# Patient Record
Sex: Female | Born: 1935 | Race: White | Hispanic: No | Smoking: Never smoker
Health system: Southern US, Community
[De-identification: ages and names within clinical notes are randomized; demographics above are authoritative.]

## PROBLEM LIST (undated history)

## (undated) DIAGNOSIS — I639 Cerebral infarction, unspecified: Secondary | ICD-10-CM

## (undated) DIAGNOSIS — I2699 Other pulmonary embolism without acute cor pulmonale: Secondary | ICD-10-CM

## (undated) DIAGNOSIS — E079 Disorder of thyroid, unspecified: Secondary | ICD-10-CM

## (undated) DIAGNOSIS — H409 Unspecified glaucoma: Secondary | ICD-10-CM

## (undated) HISTORY — PX: BACK SURGERY: SHX140

---

## 2000-10-25 ENCOUNTER — Ambulatory Visit (HOSPITAL_COMMUNITY): Admission: RE | Admit: 2000-10-25 | Discharge: 2000-10-26 | Payer: Self-pay | Admitting: Ophthalmology

## 2000-10-25 ENCOUNTER — Encounter: Payer: Self-pay | Admitting: Ophthalmology

## 2004-12-23 ENCOUNTER — Ambulatory Visit (HOSPITAL_COMMUNITY): Admission: RE | Admit: 2004-12-23 | Discharge: 2004-12-23 | Payer: Self-pay | Admitting: Pulmonary Disease

## 2005-02-12 ENCOUNTER — Ambulatory Visit (HOSPITAL_COMMUNITY): Admission: RE | Admit: 2005-02-12 | Discharge: 2005-02-12 | Payer: Self-pay | Admitting: Pulmonary Disease

## 2005-03-01 DIAGNOSIS — K559 Vascular disorder of intestine, unspecified: Secondary | ICD-10-CM

## 2005-03-01 HISTORY — DX: Vascular disorder of intestine, unspecified: K55.9

## 2005-05-25 ENCOUNTER — Ambulatory Visit: Payer: Self-pay | Admitting: Internal Medicine

## 2005-05-25 ENCOUNTER — Observation Stay (HOSPITAL_COMMUNITY): Admission: EM | Admit: 2005-05-25 | Discharge: 2005-05-27 | Payer: Self-pay | Admitting: Emergency Medicine

## 2005-05-26 ENCOUNTER — Encounter (INDEPENDENT_AMBULATORY_CARE_PROVIDER_SITE_OTHER): Payer: Self-pay | Admitting: Specialist

## 2008-07-10 ENCOUNTER — Emergency Department (HOSPITAL_COMMUNITY): Admission: EM | Admit: 2008-07-10 | Discharge: 2008-07-10 | Payer: Self-pay | Admitting: Emergency Medicine

## 2010-07-17 NOTE — Discharge Summary (Signed)
Lori George, Lori George               ACCOUNT NO.:  [id number]   MEDICAL RECORD NO.:  [id number]          PATIENT TYPE:  OBV   LOCATION:  A226                          FACILITY:  [facility]   PHYSICIAN:  Edward L. Juanetta Gosling, M.D.DATE OF BIRTH:  1932-04-11   DATE OF ADMISSION:  05/25/2005  DATE OF DISCHARGE:  03/29/2007LH                                 DISCHARGE SUMMARY   FINAL DISCHARGE DIAGNOSES:  1.  Ischemic colitis.  2.  Gastrointestinal bleeding secondary to #1.  3.  Anemia secondary to #1 and #2.  4.  Hypothyroidism.  5.  History of pneumonia.   HISTORY:  Ms. Victor is a 77 year old who came to the emergency room because  of abdominal discomfort.  The night prior to admission she started having  abdominal pain, cramping in her abdomen and then she developed severe  cramping in her abdomen and bloody diarrhea.  She has come to the emergency  room with diarrhea which has blood in it and cramping.  She has not had any  previous GI problems as far as she knows.   PHYSICAL EXAMINATION:  Physical examination shows she appears to be in mild  distress.  She was complaining of abdominal cramping.  Her abdomen was very  tender diffusely somewhat more on the right than on the left.  She had a  mucoid stool that was Hemoccult positive.   HOSPITAL COURSE:  She had a CT abdomen which showed pericolic inflammatory  stranding at the splenic flexure and descending colon.  Her other  laboratories, initially her hemoglobin level was 13.9 and it dropped to 10.5  by the time of discharge.  She had the CT, she had a consultation with the  GI team and underwent a colonoscopy.  The colonoscopy did show what appeared  to be ischemic colitis.  Because of the extent of her colitis she underwent  a CT angiogram to make sure that she did not have narrowed areas in the  blood vessels, etc., and this was normal without evidence of blood vessel  narrowing and she was discharged home in improved condition, told to  take  over-the-counter iron and is going to follow up with GI and in my office  both in about 2 weeks.      Edward L. Juanetta Gosling, M.D.  Electronically Signed     ELH/MEDQ  D:  05/27/2005  T:  05/29/2005  Job:  [id number]

## 2010-07-17 NOTE — Consult Note (Signed)
Lori George, Lori George               ACCOUNT NO.:  [id number]   MEDICAL RECORD NO.:  [id number]           PATIENT TYPE:   LOCATION:                                 FACILITY:   PHYSICIAN:  Lionel December, M.D.    DATE OF BIRTH:  May 09, 1932   DATE OF CONSULTATION:  05/25/2005  DATE OF DISCHARGE:                                   CONSULTATION   REFERRING PHYSICIAN:  Oneal Deputy. Juanetta Gosling, M.D.   REASON FOR CONSULTATION:  Rectal bleeding.   HISTORY OF PRESENT ILLNESS:  The patient is a 77 year old Caucasian female  patient of Dr. Shaune Pollack who presents to the emergency department with than  24 hour history of abdominal pain and hematochezia.  For the past couple  weeks she has had difficulty with constipation and hard stools.  She has  this intermittently.  She often has to strain to have a bowel movement.  Around 9 a.m. yesterday she developed severe onset of lower abdominal pain.  She states the pain was so severe she almost passed out.  It was located in  the lower to mid abdomen.  She had nausea but no vomiting.  No fever or  chills.  Shortly afterwards she passed fresh blood per rectum.  She has done  this on several occasions and states today the bleeding was more heavy than  yesterday.  She does have intermittent heartburn for which she takes Tums.  This also helps her feeling of indigestion or chest pressure postprandially.  She denies any weight loss.  She has never had a colonoscopy.  She denies  any NSAIDS or aspirin.   MEDICATIONS AT HOME:  Synthroid and eye drops, multivitamin, calcium.   ALLERGIES:  No known drug allergies.   PAST MEDICAL HISTORY:  1.  Chronic constipation.  2.  Hypothyroidism.  3.  She was treated for pneumonia four months ago.  4.  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  5.  No prior colonoscopy.   FAMILY HISTORY:  Mother died at age 34 of old age.  Father died of cancer,  but she is not sure what time.  She does not believe it was colon  cancer.   SOCIAL HISTORY:  She is widowed.  She has three children.  She lives alone.  She has never been a smoker, denies any alcohol use.   REVIEW OF SYSTEMS:  See HPI for GI.  CONSTITUTIONAL:  No significant weight  loss recently.  CARDIOPULMONARY:  No chest pain, shortness of breath, or  palpitations.  GENITOURINARY:  No dysuria or hematuria.   PHYSICAL EXAMINATION:  VITAL SIGNS:  Temperature 97.5, pulse 72,  respirations 16, blood pressure 129/60.  GENERAL:  Pleasant, thin, elderly Caucasian female in no acute distress.  SKIN:  Warm and dry, no jaundice.  HEENT:  Conjunctivae are pink, sclerae nonicteric.  Oropharyngeal mucosa  moist and pink.  No lesions, erythema, or exudate.  NECK:  No lymphadenopathy or thyromegaly.  CHEST:  Lungs are clear to auscultation.  CARDIAC:  Reveals regular rate and rhythm.  Normal S1/S2.  No  murmurs, rubs,  or gallops.  ABDOMEN:  Positive bowel sounds, slightly distended, symmetrical.  Abdomen  is soft.  She has moderate tenderness in the mid low abdomen to deep  palpation.  No organomegaly or masses appreciated.  No rebound tenderness or  guarding.  EXTREMITIES:  No edema.   LABORATORY DATA:  White count 11,800, hemoglobin 13.9, hematocrit 41.5, MCV  92.1, platelets 250,000.  BUN 25, creatinine 0.7, glucose 116, sodium 138,  potassium 3.8, total bilirubin 0.9, alkaline phosphatase 79, AST 27, ALT 19,  albumin 3.7.  INR 1, PTT 30.   IMPRESSION:  The patient is a 77 year old lady with less than 24 hour  history of abdominal pain/hematochezia, differential includes ischemic  colitis, although she does not have any notable risk factors.  Infectious  colitis not excluded, nor is colorectal cancer.  She has chronic  constipation which is intermittent.  No significant change recently,  however.  Hemoglobin and hematocrit stable.  CT of the abdomen and pelvis is  pending.   PLAN:  1.  Follow up CT of the abdomen and pelvis as available.  2.  Agree  with clear liquid diet, IV Protonix.  Continue supportive      measures.  3.  Follow serial H&H's as you are.  4.  She will need to have a colonoscopy at some point.  We will await CT      findings, reevaluate her in the morning, and go from there.      Tana Coast, P.A.      Lionel December, M.D.  Electronically Signed    LL/MEDQ  D:  05/25/2005  T:  05/25/2005  Job:  [id number]   cc:   Ramon Dredge L. Juanetta Gosling, M.D.  Fax: [phone]

## 2010-07-17 NOTE — Group Therapy Note (Signed)
Lori George, TOCCO               ACCOUNT NO.:  [id number]   MEDICAL RECORD NO.:  [id number]          PATIENT TYPE:  OBV   LOCATION:  A226                          FACILITY:  [facility]   PHYSICIAN:  Edward L. Juanetta Gosling, M.D.DATE OF BIRTH:  June 21, 1932   DATE OF PROCEDURE:  05/26/2005  DATE OF DISCHARGE:  05/27/2005                                   PROGRESS NOTE   SUBJECTIVE:  Lori George was admitted yesterday with what appears to be a GI  bleed.  Based on CT scan evidence and historical evidence, it is probably  ischemic colitis.  She is set for colonoscopy today.  She has no other new  complaints. Says she is having some trouble with the GoLYTELY preparation  but otherwise feels well.   OBJECTIVE:  VITAL SIGNS:  Her exam shows a temperature is 97.8, pulse 73,  respirations 20, blood pressure 111/51.  Her hemoglobin level this morning  is 13.4, white count 10,700, platelets 207.   ASSESSMENT:  She is better.   PLAN:  Plan is continue with current medications and treatments. No changes.  She is to have the colonoscopy and follow.      Edward L. Juanetta Gosling, M.D.  Electronically Signed     ELH/MEDQ  D:  05/26/2005  T:  05/27/2005  Job:  [id number]

## 2010-07-17 NOTE — Op Note (Signed)
[place]  Patient:    Lori George, Lori George Visit Number: [id number] MRN: [id number]          Service Type: DSU Location: [id number] Attending Physician:  Bertrum Sol Dictated by:   Beulah Gandy. Ashley Royalty, M.D. Proc. Date: 10/25/00 Adm. Date:  10/25/2000                             Operative Report  DATE OF BIRTH:  March 28, 1932  ADMISSION DIAGNOSIS:  Macular hole - left eye.  PROCEDURES: 1. Pars plana vitrectomy - left eye. 2. Retinal photocoagulation - left eye. 3. Gas/fluid exchange - left eye. 4. Membrane peel - left eye. 5. Serum patch - left eye. 6. Perfluoropropane injection - left eye.  SURGEON:  Beulah Gandy. Ashley Royalty, M.D.  ASSISTANT:  Lu Duffel, COA, [place].  ANESTHESIA:  General.  DETAILS:  Usual prep and drape.  Peritomies at 10, 2, and 4.  The 4 mm angled infusion port anchored into place at 4 oclock.  The lighted pick and the cutter were placed at 10 and 2 oclock respectively.  The contact lens ring was anchored into place at 6 and 12 oclock.  The vitrectomy was begun just behind the crystalline lens.  A core vitrectomy was performed down to the macular surface.  Once all the central vitreous was removed, the silicone tip suction line was placed in the eye and the fish strike sign occurred.  The posterior hyaloid was stripped free from its attachments to the edge of the hole in the disc with the silicone tip suction line and the lighted pick. Once these membranes were stripped from their attachment to the macular hole, the cutter was repositioned in the eye and additional vitrectomy was carried out removing all of this vitreous out to the equator.  The vitreous base was trimmed for 360 degrees with a 30 degree prismatic contact lens in place. Attention was then carried again to the macular region, where the magnifying contact lens was used.  The diamond dusted membrane scraper was used to peel the internal limiting membrane  from around the edge of the hole.  Once the hole was freed, the instruments were removed from the eye and the indirect ophthalmoscope laser was moved into place.  Then, 501 burns were placed around the retinal periphery with a power of 400 mW, 1000 microns each and 0.1 seconds each.  A gas/fluid exchange was then carried out with the lighted pick and suction over the optic nerve head.  Sufficient time was allowed for additional fluid to track down the walls of the eye.  During this time, a serum patch was prepared and the perfluoropropane mixture was made up to be 17%.  The fluid was removed.  The serum patch was delivered to the hole and perfluoropropane was exchanged for room air in the vitreous cavity.  The instruments were removed from the eye and 9-0 nylon was used to close the sclerotomy sites.  The wounds were tested and found to be tight with Weck-cel sponge.  Additional perfluoropropane mixture of gas was injected at 12 oclock to reinflate the globe and obtain a closing tension of 10 with the Barraquer tonometer.  The conjunctiva was closed with wet-field cautery.  Polymyxin and gentamicin were irrigated into Tenons space.  Atropine solution was applied. Decadron 10 mg was injected into the lower subconjunctival space.  Marcaine was injected around the globe for  postoperative pain.  Polysporin, a patch and shield were placed.  The patient was awakened and taken to recovery in satisfactory condition. Dictated by:   Beulah Gandy. Ashley Royalty, M.D. Attending Physician:  Bertrum Sol DD:  10/25/00 TD:  10/25/00 Job: [id number]

## 2010-07-17 NOTE — Op Note (Signed)
[name], Lori George               ACCOUNT NO.:  [id number]   MEDICAL RECORD NO.:  [id number]          PATIENT TYPE:  OBV   LOCATION:  A226                          FACILITY:  [facility]   PHYSICIAN:  Lionel December, M.D.    DATE OF BIRTH:  1932-09-15   DATE OF PROCEDURE:  05/26/2005  DATE OF DISCHARGE:                                 OPERATIVE REPORT   PROCEDURE:  Colonoscopy.   INDICATIONS:  Lori George is a 77 year old Caucasian female who presents with  sudden onset of abdominal pain followed by rectal bleeding.  She had CT  suggesting thickening to descending colon.  Her presentation is felt to be  typical of ischemic colitis.  She is undergoing diagnostic colonoscopy.  Procedure and risks were reviewed with the patient and informed consent was  obtained.   MEDICINES FOR CONSCIOUS SEDATION:  Demerol 25 mg IV, Versed 5 mg IV in  divided dose.   FINDINGS:  Procedure performed in endoscopy suite.  The patient's vital  signs and O2 saturations were monitored during the procedure and remained  stable.  The patient was placed left lateral recumbent position and rectal  examination performed.  No abnormality noted on external or digital exam.  Olympus videoscope was placed in the rectum and advanced, under vision, into  sigmoid colon and beyond.  Preparation was excellent.  She had patchy  erythema at proximal sigmoid colon along with a few small ulcers.  However,  there are multiple superficial ulcers involving descending colon.  These  changes extended to the splenic flexure.  She had focal erythema at distal  transverse colon.  Scope was passed into the cecum which was identified by  appendiceal orifice and ileocecal valve.  As the scope was withdrawn, the  colonic mucosa was examined for the second time and was normal; except in  descending colon as well as proximal sigmoid colon, biopsy was taken from  descending colon for routine histology.  Mucosa of the distal sigmoid colon  was normal.   Rectal mucosa similarly was normal.  Scope was retroflexed to  examine anorectal junction which was unremarkable.  Endoscope was  straightened and withdrawn.  The patient tolerated the procedure well.   FINAL DIAGNOSIS:  1.  Endoscopic changes are typical of ischemic colitis with most extensive      involvement of descending colon with some involvement of proximal      sigmoid colon.  2.  A few tiny diverticula at sigmoid colon.   RECOMMENDATIONS:  1.  Full liquid diet.  2.  Levsin SL before each meal.  3.  CT angio abdomen in a.m. to make sure that the major visceral arteries      are free of disease.      Lionel December, M.D.  Electronically Signed     NR/MEDQ  D:  05/26/2005  T:  05/26/2005  Job:  [id number]

## 2010-07-17 NOTE — H&P (Signed)
NAMEFELICIA, BLOOMQUIST               ACCOUNT NO.:  [id number]   MEDICAL RECORD NO.:  [id number]          PATIENT TYPE:  OBV   LOCATION:  A226                          FACILITY:  [facility]   PHYSICIAN:  Edward L. Juanetta Gosling, M.D.DATE OF BIRTH:  [birth year]   DATE OF ADMISSION:  05/25/2005  DATE OF DISCHARGE:  LH                                HISTORY & PHYSICAL   HISTORY:  Ms. Gitto is a 77 year old who came to the emergency room because  of abdominal discomfort. She said she started having trouble the night prior  to admission with abdominal pain, cramping in her abdomen, and then she  developed severe cramping and some bloody diarrhea. She says that the  cramping is intermittent but fairly severe. She has had some episodes of  cramping in the past, but it has not been exactly like this. She has had no  other GI problems in the past as far as she knows. She had an episode of  pneumonia last year and has a history of hypothyroidism but otherwise has  been in pretty good health in general. Her past medical history other than  that is negative. She has had cataract surgery.   FAMILY HISTORY:  Her father died in her 51s of cancer, mother of  complications of blood pressure problems, apparently.   SOCIAL HISTORY:  She does not drink any alcohol. She does not smoke any  tobacco.   REVIEW OF SYSTEMS:  Except as mentioned is pretty much negative. She says  she has not had anything similar to this in the past.   PHYSICAL EXAMINATION:  GENERAL:  Shows a well-developed, well-nourished  female who is in no acute distress now. She is complaining of some abdominal  cramping.  HEENT:  Her pupils are equal, round, and reactive to light and  accommodation. Her nose and throat are clear.  VITAL SIGNS:  Her vital signs are as recorded.  NECK:  Supple without masses.  CHEST:  Clear without wheezes, rales or rhonchi.  HEART:  Her heart is regular without murmur, gallop or rub.  ABDOMEN:  Her abdomen is very  mildly tender diffusely.  RECTAL:  Shows that she is somewhat tender in the rectum and has a mucoid  stool that is heme positive.  CENTRAL NERVOUS SYSTEM:  Grossly intact.  EXTREMITIES:  Showed no edema.   LABORATORY DATA:  Hemoglobin level was greater than 12, prothrombin time is  normal.   ASSESSMENT:  She has a history very suggestive of ischemic colitis. I have  discussed her situation with Dr. Karilyn Cota. I have gone ahead and ordered a CT  of the abdomen and pelvis and will await the results of that. I am going to  give her Protonix, give her IV fluids, check her hemoglobin and hematocrit  and make sure it does not drop. She will have blood as needed.      Edward L. Juanetta Gosling, M.D.  Electronically Signed     ELH/MEDQ  D:  05/25/2005  T:  05/26/2005  Job:  [id number]

## 2010-07-17 NOTE — Group Therapy Note (Signed)
Lori George, Lori George               ACCOUNT NO.:  [id number]   MEDICAL RECORD NO.:  [id number]          PATIENT TYPE:  OBV   LOCATION:  A226                          FACILITY:  [facility]   PHYSICIAN:  Edward L. Juanetta Gosling, M.D.DATE OF BIRTH:  Feb 20, 1933   DATE OF PROCEDURE:  05/27/2005  DATE OF DISCHARGE:  05/27/2005                                   PROGRESS NOTE   SUBJECTIVE:  [name] says she is feeling well.  She has no new  complaints.  Her colonoscopy yesterday did show evidence of what appears to  be an ischemic colitis.   OBJECTIVE:  Her exam shows her is temperature 98.6, pulse 67, respirations  19, blood pressure 104/58.  Her chest is clear.  Her abdomen soft.  She  looks comfortable.  Her hemoglobin has dropped to 10.5 now so she has had  some significant bleeding.   ASSESSMENT:  She has got ischemic colitis.   PLAN:  She is to undergo a CT angiogram today and then further workup  depending on that.      Edward L. Juanetta Gosling, M.D.  Electronically Signed     ELH/MEDQ  D:  05/27/2005  T:  05/28/2005  Job:  [id number]

## 2014-04-04 ENCOUNTER — Other Ambulatory Visit (HOSPITAL_COMMUNITY): Payer: Self-pay | Admitting: Pulmonary Disease

## 2014-04-04 ENCOUNTER — Ambulatory Visit (HOSPITAL_COMMUNITY)
Admission: RE | Admit: 2014-04-04 | Discharge: 2014-04-04 | Disposition: A | Discharge status: Home / Self Care | Payer: Medicare Other | Source: Ambulatory Visit | Attending: Pulmonary Disease | Admitting: Pulmonary Disease

## 2014-04-04 DIAGNOSIS — M79604 Pain in right leg: Secondary | ICD-10-CM | POA: Insufficient documentation

## 2014-04-04 DIAGNOSIS — M79605 Pain in left leg: Principal | ICD-10-CM

## 2014-04-04 DIAGNOSIS — R609 Edema, unspecified: Secondary | ICD-10-CM

## 2014-07-17 ENCOUNTER — Emergency Department (HOSPITAL_COMMUNITY)
Admission: EM | Admit: 2014-07-17 | Discharge: 2014-07-17 | Disposition: A | Discharge status: Home / Self Care | Payer: Medicare Other | Attending: Emergency Medicine | Admitting: Emergency Medicine

## 2014-07-17 ENCOUNTER — Emergency Department (HOSPITAL_COMMUNITY): Payer: Medicare Other

## 2014-07-17 ENCOUNTER — Encounter (HOSPITAL_COMMUNITY): Payer: Self-pay | Admitting: *Deleted

## 2014-07-17 ENCOUNTER — Other Ambulatory Visit: Payer: Self-pay

## 2014-07-17 DIAGNOSIS — E079 Disorder of thyroid, unspecified: Secondary | ICD-10-CM | POA: Diagnosis not present

## 2014-07-17 DIAGNOSIS — R0602 Shortness of breath: Secondary | ICD-10-CM | POA: Diagnosis not present

## 2014-07-17 DIAGNOSIS — R079 Chest pain, unspecified: Secondary | ICD-10-CM

## 2014-07-17 DIAGNOSIS — Z79899 Other long term (current) drug therapy: Secondary | ICD-10-CM | POA: Insufficient documentation

## 2014-07-17 DIAGNOSIS — J449 Chronic obstructive pulmonary disease, unspecified: Secondary | ICD-10-CM | POA: Diagnosis not present

## 2014-07-17 DIAGNOSIS — R072 Precordial pain: Secondary | ICD-10-CM | POA: Diagnosis not present

## 2014-07-17 HISTORY — DX: Disorder of thyroid, unspecified: E07.9

## 2014-07-17 LAB — COMPREHENSIVE METABOLIC PANEL
ALT: 21 U/L (ref 14–54)
AST: 24 U/L (ref 15–41)
Albumin: 4.2 g/dL (ref 3.5–5.0)
Alkaline Phosphatase: 82 U/L (ref 38–126)
Anion gap: 8 (ref 5–15)
BILIRUBIN TOTAL: 0.6 mg/dL (ref 0.3–1.2)
BUN: 26 mg/dL — ABNORMAL HIGH (ref 6–20)
CHLORIDE: 103 mmol/L (ref 101–111)
CO2: 28 mmol/L (ref 22–32)
Calcium: 9.9 mg/dL (ref 8.9–10.3)
Creatinine, Ser: 0.74 mg/dL (ref 0.44–1.00)
GFR calc Af Amer: >60 mL/min (ref >60)
GFR calc non Af Amer: >60 mL/min (ref >60)
Glucose, Bld: 110 mg/dL — ABNORMAL HIGH (ref 65–99)
POTASSIUM: 4.4 mmol/L (ref 3.5–5.1)
Sodium: 139 mmol/L (ref 135–145)
TOTAL PROTEIN: 7 g/dL (ref 6.5–8.1)

## 2014-07-17 LAB — CBC WITH DIFFERENTIAL/PLATELET
BASOS PCT: 1 % (ref 0–1)
Basophils Absolute: 0 10*3/uL (ref 0.0–0.1)
EOS PCT: 1 % (ref 0–5)
Eosinophils Absolute: 0.1 10*3/uL (ref 0.0–0.7)
HEMATOCRIT: 42.7 % (ref 36.0–46.0)
HEMOGLOBIN: 14.3 g/dL (ref 12.0–15.0)
Lymphocytes Relative: 39 % (ref 12–46)
Lymphs Abs: 2.2 10*3/uL (ref 0.7–4.0)
MCH: 31 pg (ref 26.0–34.0)
MCHC: 33.5 g/dL (ref 30.0–36.0)
MCV: 92.4 fL (ref 78.0–100.0)
MONO ABS: 0.4 10*3/uL (ref 0.1–1.0)
MONOS PCT: 8 % (ref 3–12)
NEUTROS ABS: 2.9 10*3/uL (ref 1.7–7.7)
Neutrophils Relative %: 51 % (ref 43–77)
Platelets: 253 10*3/uL (ref 150–400)
RBC: 4.62 MIL/uL (ref 3.87–5.11)
RDW: 12.3 % (ref 11.5–15.5)
WBC: 5.6 10*3/uL (ref 4.0–10.5)

## 2014-07-17 LAB — TROPONIN I

## 2014-07-17 MED ORDER — PANTOPRAZOLE SODIUM 40 MG PO TBEC
40.0000 mg | DELAYED_RELEASE_TABLET | Freq: Once | ORAL | Status: AC
  Administered 2014-07-17: 40 mg via ORAL
  Filled 2014-07-17: qty 1

## 2014-07-17 MED ORDER — PANTOPRAZOLE SODIUM 20 MG PO TBEC: 20.0000 mg | DELAYED_RELEASE_TABLET | Freq: Every day | ORAL | Status: DC

## 2014-07-17 NOTE — ED Provider Notes (Signed)
CSN: [id number]     Arrival date & time 07/17/14  1731 History   First MD Initiated Contact with Patient 07/17/14 1757     Chief Complaint  Patient presents with  . Chest Pain     (Consider location/radiation/quality/duration/timing/severity/associated sxs/prior Treatment) Patient is a 81 y.o. female presenting with chest pain. The history is provided by the patient (the pt states she had some epigastric and chest pain today that was helped by taking antacids).  Chest Pain Pain location:  Substernal area Pain quality: aching   Pain radiates to:  Does not radiate Pain radiates to the back: no   Pain severity:  Mild Onset quality:  Sudden Timing:  Sporadic Chronicity:  New Context: not breathing   Associated symptoms: no abdominal pain, no back pain, no cough, no fatigue and no headache     Past Medical History  Diagnosis Date  . Thyroid disease    Past Surgical History  Procedure Laterality Date  . Back surgery     No family history on file. History  Substance Use Topics  . Smoking status: Never Smoker   . Smokeless tobacco: Not on file  . Alcohol Use: No   OB History    No data available     Review of Systems  Constitutional: Negative for appetite change and fatigue.  HENT: Negative for congestion, ear discharge and sinus pressure.   Eyes: Negative for discharge.  Respiratory: Negative for cough.   Cardiovascular: Positive for chest pain.  Gastrointestinal: Negative for abdominal pain and diarrhea.  Genitourinary: Negative for frequency and hematuria.  Musculoskeletal: Negative for back pain.  Skin: Negative for rash.  Neurological: Negative for seizures and headaches.  Psychiatric/Behavioral: Negative for hallucinations.      Allergies  Review of patient's allergies indicates no known allergies.  Home Medications   Prior to Admission medications   Medication Sig Start Date End Date Taking? Authorizing Provider  brimonidine (ALPHAGAN) 0.2 % ophthalmic  solution Place 1 drop into both eyes daily. 05/30/14  Yes Historical Provider, MD  levothyroxine (SYNTHROID, LEVOTHROID) 88 MCG tablet Take 88 mcg by mouth daily before breakfast.   Yes Historical Provider, MD  timolol (TIMOPTIC) 0.5 % ophthalmic solution Place 1 drop into both eyes daily. 04/18/14  Yes Historical Provider, MD  pantoprazole (PROTONIX) 20 MG tablet Take 1 tablet (20 mg total) by mouth daily. 07/17/14   Bethann BerkshireJoseph Abdinasir Spadafore, MD   BP 133/68 mmHg  Pulse 73  Temp(Src) 98.1 F (36.7 C) (Oral)  Resp 26  Ht 5\' 4"  (1.626 m)  Wt 126 lb (57.153 kg)  BMI 21.62 kg/m2  SpO2 98% Physical Exam  Constitutional: She is oriented to person, place, and time. She appears well-developed.  HENT:  Head: Normocephalic.  Eyes: Conjunctivae and EOM are normal. No scleral icterus.  Neck: Neck supple. No thyromegaly present.  Cardiovascular: Normal rate and regular rhythm.  Exam reveals no gallop and no friction rub.   No murmur heard. Pulmonary/Chest: No stridor. She has no wheezes. She has no rales. She exhibits no tenderness.  Abdominal: She exhibits no distension. There is no tenderness. There is no rebound.  Musculoskeletal: Normal range of motion. She exhibits no edema.  Lymphadenopathy:    She has no cervical adenopathy.  Neurological: She is oriented to person, place, and time. She exhibits normal muscle tone. Coordination normal.  Skin: No rash noted. No erythema.  Psychiatric: She has a normal mood and affect. Her behavior is normal.    ED Course  Procedures (  including critical care time) Labs Review Labs Reviewed  COMPREHENSIVE METABOLIC PANEL - Abnormal; Notable for the following:    Glucose, Bld 110 (*)    BUN 26 (*)    All other components within normal limits  TROPONIN I  CBC WITH DIFFERENTIAL/PLATELET  TROPONIN I    Imaging Review Dg Chest 2 View  07/17/2014   CLINICAL DATA:  Mid chest pain and shortness of breath for several hours, vomiting  EXAM: CHEST  2 VIEW  COMPARISON:   02/12/2005  FINDINGS: Enlargement of cardiac silhouette.  Mediastinal contours and pulmonary vascularity normal.  Emphysematous and bronchitic changes consistent with COPD.  No acute infiltrate, pleural effusion or pneumothorax.  Bones demineralized with prior spinal augmentation procedure at upper lumbar spine.  IMPRESSION: COPD changes.  Enlargement of cardiac silhouette.  No acute abnormalities.   Electronically Signed   By: Ulyses SouthwardMark  Boles M.D.   On: 07/17/2014 18:52     EKG Interpretation   Date/Time:  Wednesday Jul 17 2014 17:35:11 EDT Ventricular Rate:  81 PR Interval:  136 QRS Duration: 80 QT Interval:  382 QTC Calculation: 443 R Axis:   57 Text Interpretation:  Normal sinus rhythm Possible Left atrial enlargement  Borderline ECG Confirmed by Kaeleen Odom  MD, Jomarie LongsJOSEPH [phone]([id number]) on 07/17/2014  7:08:41 PM Also confirmed by Hatcher Froning  MD, Jomarie LongsJOSEPH [phone]([id number])  on 07/17/2014  9:22:15 PM      MDM   Final diagnoses:  Chest pain at rest    Pt without pain,  Nl troponin x2 will rx protonix and have pt follow up with pcp in 1-2 days    Bethann BerkshireJoseph Zaynab Chipman, MD 07/17/14 2130

## 2014-07-17 NOTE — ED Notes (Signed)
MD at bedside. 

## 2014-07-17 NOTE — ED Notes (Addendum)
Pt c/o mid center chest pain that started a few hours with sob, "vomiting of sour contents X1",radiates to back area,   family reports that they gave pt one Prilosec with some improvement in pain, ekg performed in triage,

## 2014-07-17 NOTE — Discharge Instructions (Signed)
Take one aspirin a day and follow up with dr. Juanetta GoslingHawkins in 1-2 days.  Return if any problems

## 2014-07-26 DIAGNOSIS — E039 Hypothyroidism, unspecified: Secondary | ICD-10-CM | POA: Diagnosis not present

## 2014-07-26 DIAGNOSIS — I10 Essential (primary) hypertension: Secondary | ICD-10-CM | POA: Diagnosis not present

## 2014-07-26 DIAGNOSIS — K219 Gastro-esophageal reflux disease without esophagitis: Secondary | ICD-10-CM | POA: Diagnosis not present

## 2014-10-31 DIAGNOSIS — Z0000 Encounter for general adult medical examination without abnormal findings: Secondary | ICD-10-CM | POA: Diagnosis not present

## 2014-10-31 DIAGNOSIS — N390 Urinary tract infection, site not specified: Secondary | ICD-10-CM | POA: Diagnosis not present

## 2014-11-06 DIAGNOSIS — E039 Hypothyroidism, unspecified: Secondary | ICD-10-CM | POA: Diagnosis not present

## 2014-11-06 DIAGNOSIS — Z0000 Encounter for general adult medical examination without abnormal findings: Secondary | ICD-10-CM | POA: Diagnosis not present

## 2014-11-06 DIAGNOSIS — D649 Anemia, unspecified: Secondary | ICD-10-CM | POA: Diagnosis not present

## 2014-12-20 ENCOUNTER — Emergency Department (HOSPITAL_COMMUNITY)
Admission: EM | Admit: 2014-12-20 | Discharge: 2014-12-20 | Disposition: A | Discharge status: Home / Self Care | Payer: Medicare Other | Attending: Emergency Medicine | Admitting: Emergency Medicine

## 2014-12-20 ENCOUNTER — Emergency Department (HOSPITAL_COMMUNITY): Payer: Medicare Other

## 2014-12-20 ENCOUNTER — Encounter (HOSPITAL_COMMUNITY): Payer: Self-pay | Admitting: *Deleted

## 2014-12-20 DIAGNOSIS — E079 Disorder of thyroid, unspecified: Secondary | ICD-10-CM | POA: Insufficient documentation

## 2014-12-20 DIAGNOSIS — Y93H1 Activity, digging, shoveling and raking: Secondary | ICD-10-CM | POA: Diagnosis not present

## 2014-12-20 DIAGNOSIS — M79641 Pain in right hand: Secondary | ICD-10-CM | POA: Diagnosis not present

## 2014-12-20 DIAGNOSIS — M1990 Unspecified osteoarthritis, unspecified site: Secondary | ICD-10-CM | POA: Insufficient documentation

## 2014-12-20 DIAGNOSIS — Y998 Other external cause status: Secondary | ICD-10-CM | POA: Diagnosis not present

## 2014-12-20 DIAGNOSIS — S5011XA Contusion of right forearm, initial encounter: Secondary | ICD-10-CM | POA: Insufficient documentation

## 2014-12-20 DIAGNOSIS — Y92007 Garden or yard of unspecified non-institutional (private) residence as the place of occurrence of the external cause: Secondary | ICD-10-CM | POA: Diagnosis not present

## 2014-12-20 DIAGNOSIS — S60221A Contusion of right hand, initial encounter: Secondary | ICD-10-CM | POA: Diagnosis not present

## 2014-12-20 DIAGNOSIS — W010XXA Fall on same level from slipping, tripping and stumbling without subsequent striking against object, initial encounter: Secondary | ICD-10-CM | POA: Diagnosis not present

## 2014-12-20 DIAGNOSIS — Z79899 Other long term (current) drug therapy: Secondary | ICD-10-CM | POA: Diagnosis not present

## 2014-12-20 DIAGNOSIS — S6991XA Unspecified injury of right wrist, hand and finger(s), initial encounter: Secondary | ICD-10-CM | POA: Diagnosis present

## 2014-12-20 NOTE — Discharge Instructions (Signed)
Your x-ray tonight does not show broken bones or dislocation. The ray will not show ligament injury. Apply ice, elevate, wear the splint for comfort, take Aleve for pain, follow up with Dr. Izora Ribasoley for further evaluation.

## 2014-12-20 NOTE — ED Notes (Signed)
Pt fell while raking her yard ~1430 and later began to have swelling and pain to right hand.

## 2014-12-20 NOTE — ED Provider Notes (Signed)
CSN: [id number]     Arrival date & time 12/20/14  1828 History   First MD Initiated Contact with Patient 12/20/14 2217     Chief Complaint  Patient presents with  . Hand Injury     (Consider location/radiation/quality/duration/timing/severity/associated sxs/prior Treatment) Patient is a 81 y.o. female presenting with hand injury. The history is provided by the patient.  Hand Injury Location:  Hand Hand location:  R hand Pain details:    Quality:  Aching   Radiates to:  Does not radiate   Timing:  Constant  Blaklee P Taitano is a 81 y.o. female who presents to the ED with swelling to the right hand that started today after she was raking her yard. She states that she started across the yard to pick up a can and tripped on a root and fell. She put her right hand out to keep from hitting her face and her fingers bent under and her arm slid on the ground. She complains of swelling and pain to the hand and bruising and pain to the forearm. 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 She takes Aleve for her pain. She states that the pain is not as bad now as it was earlier today.   Past Medical History  Diagnosis Date  . Thyroid disease    Past Surgical History  Procedure Laterality Date  . Back surgery     No family history on file. Social History  Substance Use Topics  . Smoking status: Never Smoker   . Smokeless tobacco: None  . Alcohol Use: No   OB History    No data available     Review of Systems Negative except as stated in HPI   Allergies  Review of patient's allergies indicates no known allergies.  Home Medications   Prior to Admission medications   Medication Sig Start Date End Date Taking? Authorizing Provider  brimonidine (ALPHAGAN) 0.2 % ophthalmic solution Place 1 drop into both eyes daily. 05/30/14   Historical Provider, MD  levothyroxine (SYNTHROID, LEVOTHROID) 88 MCG tablet Take 88 mcg by mouth daily before  breakfast.    Historical Provider, MD  pantoprazole (PROTONIX) 20 MG tablet Take 1 tablet (20 mg total) by mouth daily. 07/17/14   Bethann Berkshire, MD  timolol (TIMOPTIC) 0.5 % ophthalmic solution Place 1 drop into both eyes daily. 04/18/14   Historical Provider, MD   BP 124/79 mmHg  Pulse 78  Temp(Src) 97.8 F (36.6 C) (Oral)  Resp 18  Ht  (1.626 m)  Wt 117 lb (53.071 kg)  BMI 20.07 kg/m2  SpO2 100% Physical Exam  Constitutional: She is oriented to person, place, and time. She appears well-developed and well-nourished.  HENT:  Head: Normocephalic and atraumatic.  Eyes: Conjunctivae and EOM are normal.  Neck: Normal range of motion. Neck supple.  Cardiovascular: Normal rate.   Pulmonary/Chest: Effort normal.  Musculoskeletal:       Right forearm: She exhibits tenderness. She exhibits no laceration.       Right hand: She exhibits tenderness, bony tenderness and swelling. She exhibits normal capillary refill and no laceration. Decreased range of motion: due to pain. Normal sensation noted. Normal strength noted.       Hands: Pain and swelling to the dorsum of the right hand with mild erythema. Ecchymosis and tenderness to the palmar aspect of the right wrist. Radial pulse 2+, adequate circulation, good touch sensation. Full range of motion of the elbow and  shoulder.   Neurological: She is alert and oriented to person, place, and time. No cranial nerve deficit.  Skin: Skin is warm and dry.  Psychiatric: She has a normal mood and affect. Her behavior is normal.  Nursing note and vitals reviewed.   ED Course  Procedures (including critical care time) Labs Review Labs Reviewed - No data to display  Imaging Review Dg Hand Complete Right  12/20/2014  CLINICAL DATA:  Fall, right hand swelling/pain EXAM: RIGHT HAND - COMPLETE 3+ VIEW COMPARISON:  None. FINDINGS: No fracture or dislocation is seen. Mild degenerative changes at the 1st MCP joint and 2nd-4th DIP joints. Mild soft tissue  swelling along the 2nd digit. Mild dorsal soft tissue swelling overlying the MCP joints. IMPRESSION: No fracture or dislocation is seen. Electronically Signed   By: Charline BillsSriyesh  Krishnan M.D.   On: 12/20/2014 18:57    MDM  81 y.o. female with pain and swelling of the right hand and bruising to the right forearm s/p fall earlier today. Wrist splint, ice, elevation and follow up with the hand surgeon. Discussed with the patient and her family signs and symptoms to watch for with compartment syndrome. Discussed x-ray results and plan of care. Stable for d/c without focal neuro deficits.  Final diagnoses:  Contusion, hand, right, initial encounter  Forearm contusion, right, initial encounter       Janne NapoleonHope M Neese, NP 12/20/14 2316  Samuel JesterKathleen McManus, DO 12/24/14 1919

## 2015-03-27 DIAGNOSIS — E039 Hypothyroidism, unspecified: Secondary | ICD-10-CM | POA: Diagnosis not present

## 2015-03-27 DIAGNOSIS — D649 Anemia, unspecified: Secondary | ICD-10-CM | POA: Diagnosis not present

## 2015-04-04 DIAGNOSIS — H52221 Regular astigmatism, right eye: Secondary | ICD-10-CM | POA: Diagnosis not present

## 2015-04-04 DIAGNOSIS — H401112 Primary open-angle glaucoma, right eye, moderate stage: Secondary | ICD-10-CM | POA: Diagnosis not present

## 2015-04-04 DIAGNOSIS — H5212 Myopia, left eye: Secondary | ICD-10-CM | POA: Diagnosis not present

## 2015-04-04 DIAGNOSIS — H524 Presbyopia: Secondary | ICD-10-CM | POA: Diagnosis not present

## 2015-04-17 DIAGNOSIS — H401112 Primary open-angle glaucoma, right eye, moderate stage: Secondary | ICD-10-CM | POA: Diagnosis not present

## 2015-04-17 DIAGNOSIS — H401121 Primary open-angle glaucoma, left eye, mild stage: Secondary | ICD-10-CM | POA: Diagnosis not present

## 2015-08-14 DIAGNOSIS — H6123 Impacted cerumen, bilateral: Secondary | ICD-10-CM | POA: Diagnosis not present

## 2015-08-14 DIAGNOSIS — H903 Sensorineural hearing loss, bilateral: Secondary | ICD-10-CM | POA: Diagnosis not present

## 2015-08-14 DIAGNOSIS — B369 Superficial mycosis, unspecified: Secondary | ICD-10-CM | POA: Diagnosis not present

## 2015-08-14 DIAGNOSIS — H6240 Otitis externa in other diseases classified elsewhere, unspecified ear: Secondary | ICD-10-CM | POA: Diagnosis not present

## 2015-09-30 ENCOUNTER — Emergency Department (HOSPITAL_COMMUNITY)
Admission: EM | Admit: 2015-09-30 | Discharge: 2015-09-30 | Disposition: A | Discharge status: Home / Self Care | Payer: Medicare Other | Attending: Emergency Medicine | Admitting: Emergency Medicine

## 2015-09-30 ENCOUNTER — Encounter (HOSPITAL_COMMUNITY): Payer: Self-pay | Admitting: Emergency Medicine

## 2015-09-30 ENCOUNTER — Emergency Department (HOSPITAL_COMMUNITY): Payer: Medicare Other

## 2015-09-30 DIAGNOSIS — Z79899 Other long term (current) drug therapy: Secondary | ICD-10-CM | POA: Insufficient documentation

## 2015-09-30 DIAGNOSIS — K5900 Constipation, unspecified: Secondary | ICD-10-CM | POA: Diagnosis not present

## 2015-09-30 DIAGNOSIS — R1084 Generalized abdominal pain: Secondary | ICD-10-CM | POA: Diagnosis not present

## 2015-09-30 LAB — COMPREHENSIVE METABOLIC PANEL
ALK PHOS: 64 U/L (ref 38–126)
ALT: 23 U/L (ref 14–54)
AST: 22 U/L (ref 15–41)
Albumin: 4.6 g/dL (ref 3.5–5.0)
Anion gap: 6 (ref 5–15)
BILIRUBIN TOTAL: 0.5 mg/dL (ref 0.3–1.2)
BUN: 22 mg/dL — AB (ref 6–20)
CALCIUM: 10.1 mg/dL (ref 8.9–10.3)
CO2: 26 mmol/L (ref 22–32)
Chloride: 104 mmol/L (ref 101–111)
Creatinine, Ser: 0.75 mg/dL (ref 0.44–1.00)
GFR calc Af Amer: >60 mL/min (ref >60)
Glucose, Bld: 141 mg/dL — ABNORMAL HIGH (ref 65–99)
Potassium: 3.5 mmol/L (ref 3.5–5.1)
Sodium: 136 mmol/L (ref 135–145)
TOTAL PROTEIN: 7.5 g/dL (ref 6.5–8.1)

## 2015-09-30 LAB — SAMPLE TO BLOOD BANK

## 2015-09-30 LAB — CBC WITH DIFFERENTIAL/PLATELET
BASOS ABS: 0 10*3/uL (ref 0.0–0.1)
BASOS PCT: 1 %
EOS ABS: 0 10*3/uL (ref 0.0–0.7)
EOS PCT: 0 %
HCT: 39.2 % (ref 36.0–46.0)
Hemoglobin: 13.2 g/dL (ref 12.0–15.0)
Lymphocytes Relative: 19 %
Lymphs Abs: 1.3 10*3/uL (ref 0.7–4.0)
MCH: 30.8 pg (ref 26.0–34.0)
MCHC: 33.7 g/dL (ref 30.0–36.0)
MCV: 91.6 fL (ref 78.0–100.0)
Monocytes Absolute: 0.5 10*3/uL (ref 0.1–1.0)
Monocytes Relative: 8 %
Neutro Abs: 5.1 10*3/uL (ref 1.7–7.7)
Neutrophils Relative %: 72 %
PLATELETS: 236 10*3/uL (ref 150–400)
RBC: 4.28 MIL/uL (ref 3.87–5.11)
RDW: 12.7 % (ref 11.5–15.5)
WBC: 7 10*3/uL (ref 4.0–10.5)

## 2015-09-30 LAB — PROTIME-INR
INR: 0.96
PROTHROMBIN TIME: 12.8 s (ref 11.4–15.2)

## 2015-09-30 LAB — TROPONIN I

## 2015-09-30 LAB — APTT: aPTT: 27 seconds (ref 24–36)

## 2015-09-30 MED ORDER — ONDANSETRON HCL 4 MG/2ML IJ SOLN
4.0000 mg | Freq: Once | INTRAMUSCULAR | Status: AC
  Administered 2015-09-30: 4 mg via INTRAVENOUS

## 2015-09-30 MED ORDER — FLEET ENEMA 7-19 GM/118ML RE ENEM
1.0000 | ENEMA | Freq: Once | RECTAL | Status: AC
  Administered 2015-09-30: 1 via RECTAL

## 2015-09-30 MED ORDER — PEG 3350-KCL-NA BICARB-NACL 420 G PO SOLR
4000.0000 mL | Freq: Once | ORAL | Status: AC
  Administered 2015-09-30: 4000 mL via ORAL
  Filled 2015-09-30: qty 4000

## 2015-09-30 MED ORDER — FENTANYL CITRATE (PF) 100 MCG/2ML IJ SOLN
50.0000 ug | Freq: Once | INTRAMUSCULAR | Status: AC
  Administered 2015-09-30: 50 ug via INTRAVENOUS
  Filled 2015-09-30: qty 2

## 2015-09-30 MED ORDER — ONDANSETRON HCL 4 MG/2ML IJ SOLN
INTRAMUSCULAR | Status: AC
  Filled 2015-09-30: qty 2

## 2015-09-30 MED ORDER — ONDANSETRON HCL 4 MG/2ML IJ SOLN
4.0000 mg | Freq: Once | INTRAMUSCULAR | Status: AC
  Administered 2015-09-30: 4 mg via INTRAVENOUS
  Filled 2015-09-30: qty 2

## 2015-09-30 MED ORDER — SODIUM CHLORIDE 0.9 % IV BOLUS (SEPSIS)
1000.0000 mL | Freq: Once | INTRAVENOUS | Status: AC
  Administered 2015-09-30: 1000 mL via INTRAVENOUS

## 2015-09-30 NOTE — Discharge Instructions (Signed)
Take the nulytely as directed. You should start having good bowel movements afterwards. You can then take MiraLAX over-the-counter once or twice daily as needed to keep your bowel movements regular. Return to the emergency department if you get fever, vomiting, or worsening abdominal pain again.

## 2015-09-30 NOTE — ED Provider Notes (Signed)
[facility] DEPT Provider Note   CSN: [id number] Arrival date & time: 09/30/15  0153  First Provider Contact:  02:05 AM   Level V caveat for urgent need for intervention   History   Chief Complaint Chief Complaint  Patient presents with  . Abdominal Pain    HPI Lori George is a 82 y.o. female.  HPI patient reports she woke up from sleep about 3 hours ago with diffuse abdominal pain. She states the pain is getting worse. She feels bloated and has had nausea without vomiting. She's also been belching. She states she's had a normal appetite. She states she has been constipated for the past 2 weeks. She has been taking fiber pills without relief. She states she has been passing gas rectally without having bowel movements. She states she's never had this pain before.   PCP Dr Juanetta Gosling  Past Medical History:  Diagnosis Date  . Thyroid disease     There are no active problems to display for this patient.   Past Surgical History:  Procedure Laterality Date  . BACK SURGERY      OB History    No data available       Home Medications    Prior to Admission medications   Medication Sig Start Date End Date Taking? Authorizing Provider  brimonidine (ALPHAGAN) 0.2 % ophthalmic solution Place 1 drop into both eyes daily. 05/30/14  Yes Historical Provider, MD  levothyroxine (SYNTHROID, LEVOTHROID) 88 MCG tablet Take 88 mcg by mouth daily before breakfast.   Yes Historical Provider, MD  timolol (TIMOPTIC) 0.5 % ophthalmic solution Place 1 drop into both eyes daily. 04/18/14  Yes Historical Provider, MD  pantoprazole (PROTONIX) 20 MG tablet Take 1 tablet (20 mg total) by mouth daily. 07/17/14   Bethann Berkshire, MD    Family History History reviewed. No pertinent family history.  Social History Social History  Substance Use Topics  . Smoking status: Never Smoker  . Smokeless tobacco: Never Used  . Alcohol use No  Lives at home Lives alone   Allergies   Review of  patient's allergies indicates no known allergies.   Review of Systems Review of Systems  All other systems reviewed and are negative.    Physical Exam Updated Vital Signs BP 153/79   Pulse 76   Temp 97.5 F (36.4 C)   Resp (!) 32   Ht 5\' 4"  (1.626 m)   Wt 126 lb (57.2 kg)   SpO2 100%   BMI 21.63 kg/m   Vital signs normal except for hypertension and tachypnea   Physical Exam  Constitutional: She is oriented to person, place, and time.  Non-toxic appearance. She does not appear ill. She appears distressed.  Thin elderly female in distress.  HENT:  Head: Normocephalic and atraumatic.  Right Ear: External ear normal.  Left Ear: External ear normal.  Nose: Nose normal. No mucosal edema or rhinorrhea.  Mouth/Throat: Oropharynx is clear and moist and mucous membranes are normal. No dental abscesses or uvula swelling.  Eyes: Conjunctivae and EOM are normal. Pupils are equal, round, and reactive to light.  Neck: Normal range of motion and full passive range of motion without pain. Neck supple.  Cardiovascular: Normal rate, regular rhythm and normal heart sounds.  Exam reveals no gallop and no friction rub.   No murmur heard. Pulmonary/Chest: Effort normal and breath sounds normal. Tachypnea noted. No respiratory distress. She has no wheezes. She has no rhonchi. She has no rales. She exhibits no tenderness  and no crepitus.  Abdominal: Soft. Normal appearance. She exhibits distension. Bowel sounds are decreased. There is generalized tenderness. There is guarding. There is no rebound.  Musculoskeletal: Normal range of motion. She exhibits no edema or tenderness.  Moves all extremities well.   Neurological: She is alert and oriented to person, place, and time. She has normal strength. No cranial nerve deficit.  Skin: Skin is warm, dry and intact. No rash noted. No erythema. No pallor.  Psychiatric: She has a normal mood and affect. Her speech is normal and behavior is normal. Her mood  appears not anxious.  Nursing note and vitals reviewed.    ED Treatments / Results  Labs (all labs ordered are listed, but only abnormal results are displayed) Results for orders placed or performed during the hospital encounter of 09/30/15  Comprehensive metabolic panel  Result Value Ref Range   Sodium 136 135 - 145 mmol/L   Potassium 3.5 3.5 - 5.1 mmol/L   Chloride 104 101 - 111 mmol/L   CO2 26 22 - 32 mmol/L   Glucose, Bld 141 (H) 65 - 99 mg/dL   BUN 22 (H) 6 - 20 mg/dL   Creatinine, Ser 4.09 0.44 - 1.00 mg/dL   Calcium 81.1 8.9 - 91.4 mg/dL   Total Protein 7.5 6.5 - 8.1 g/dL   Albumin 4.6 3.5 - 5.0 g/dL   AST 22 15 - 41 U/L   ALT 23 14 - 54 U/L   Alkaline Phosphatase 64 38 - 126 U/L   Total Bilirubin 0.5 0.3 - 1.2 mg/dL   GFR calc non Af Amer >60 >60 mL/min   GFR calc Af Amer >60 >60 mL/min   Anion gap 6 5 - 15  CBC with Differential  Result Value Ref Range   WBC 7.0 4.0 - 10.5 K/uL   RBC 4.28 3.87 - 5.11 MIL/uL   Hemoglobin 13.2 12.0 - 15.0 g/dL   HCT 78.2 95.6 - 21.3 %   MCV 91.6 78.0 - 100.0 fL   MCH 30.8 26.0 - 34.0 pg   MCHC 33.7 30.0 - 36.0 g/dL   RDW 08.6 57.8 - 46.9 %   Platelets 236 150 - 400 K/uL   Neutrophils Relative % 72 %   Neutro Abs 5.1 1.7 - 7.7 K/uL   Lymphocytes Relative 19 %   Lymphs Abs 1.3 0.7 - 4.0 K/uL   Monocytes Relative 8 %   Monocytes Absolute 0.5 0.1 - 1.0 K/uL   Eosinophils Relative 0 %   Eosinophils Absolute 0.0 0.0 - 0.7 K/uL   Basophils Relative 1 %   Basophils Absolute 0.0 0.0 - 0.1 K/uL  Troponin I  Result Value Ref Range   Troponin I <0.03 <0.03 ng/mL  Protime-INR  Result Value Ref Range   Prothrombin Time 12.8 11.4 - 15.2 seconds   INR 0.96   APTT  Result Value Ref Range   aPTT 27 24 - 36 seconds   Laboratory interpretation all normal except hyperglycemia (nonfasting)    EKG  EKG Interpretation  Date/Time:  Tuesday September 30 2015 02:10:24 EDT Ventricular Rate:  78 PR Interval:    QRS Duration: 92 QT  Interval:  394 QTC Calculation: 449 R Axis:   87 Text Interpretation:  Sinus rhythm Borderline right axis deviation Nonspecific T abnrm, anterolateral leads Baseline wander No significant change since last tracing 17 Jul 2014 Confirmed by Emilianna Barlowe  MD-I, Tamakia Porto ([id number]) on 09/30/2015 2:18:32 AM       Radiology Ct Abdomen Pelvis Wo  Contrast  Result Date: 09/30/2015 CLINICAL DATA:  82 year old female with a few diffuse abdominal pain. EXAM: CT ABDOMEN AND PELVIS WITHOUT CONTRAST TECHNIQUE: Multidetector CT imaging of the abdomen and pelvis was performed following the standard protocol without IV contrast. COMPARISON:  CT dated 05/27/2005 FINDINGS: The visualized lung bases are clear.  Mild cardiomegaly. No intra-abdominal free air or free fluid. The the liver, gallbladder, pancreas, spleen, and the adrenal glands appear unremarkable. There are nonobstructing bilateral renal calculi. A cluster of small stones noted in the upper pole of the left kidney with a combined diameter of approximately 5 mm. There is no hydronephrosis on either side. The visualized ureters and urinary bladder appear unremarkable. The uterus and ovaries are grossly unremarkable. There is moderate stool throughout the colon. The cecum is mobile and located in the left upper abdomen. No evidence of twisting or mechanical obstruction. The terminal ileum is collapsed and likely extends superiorly through a mesenteric defect. Normal caliber fluid containing loops of bowel noted in the pelvis. There is no evidence of small-bowel obstruction at this time. Correlation with clinical exam and follow-up recommended. Normal appendix. The abdominal aorta and IVC appear unremarkable. No portal venous gas identified. There is no adenopathy. The abdominal wall soft tissues appear unremarkable. There is osteopenia with degenerative changes of the spine. L1 compression fracture with vertebroplasty changes. No acute fracture. IMPRESSION: Mobile cecum located in  the left upper abdomen. There is no evidence of twisting or mechanical obstruction of the colon. Collapse appearance of the terminal ileum with normal caliber loops of distal small bowel. No definite evidence of bowel obstruction at this time. Normal appendix. Small Nonobstructing bilateral renal calculi.  No hydronephrosis. Electronically Signed   By: Elgie Collard M.D.   On: 09/30/2015 03:24    Procedures Procedures (including critical care time)  Medications Ordered in ED Medications  polyethylene glycol-electrolytes (NuLYTELY/GoLYTELY) solution 4,000 mL (not administered)  sodium chloride 0.9 % bolus 1,000 mL (0 mLs Intravenous Stopped 09/30/15 0353)  ondansetron (ZOFRAN) injection 4 mg (4 mg Intravenous Given 09/30/15 0227)  fentaNYL (SUBLIMAZE) injection 50 mcg (50 mcg Intravenous Given 09/30/15 0227)  ondansetron (ZOFRAN) injection 4 mg (4 mg Intravenous Given 09/30/15 0305)  sodium phosphate (FLEET) 7-19 GM/118ML enema 1 enema (1 enema Rectal Given 09/30/15 0347)     Initial Impression / Assessment and Plan / ED Course  I have reviewed the triage vital signs and the nursing notes.  Pertinent labs & imaging results that were available during my care of the patient were reviewed by me and considered in my medical decision making (see chart for details).  Clinical Course   Patient appears to be in moderate distress from pain. Her abdomen is very distended. CT of the abdomen/pelvis was done without contrast to look for possible AAA, bowel perforation, or possibly just constipation. Patient was given IV fluids and IV pain and nausea medication. Laboratory tests were ordered.  After reviewing and discussing patient's CT scan with patient she was given an enema for constipation.  Patient had minimal results with the enema. However her pain is much improved now, her abdomen is soft and I compel patent without discomfort. We discussed taking MiraLAX over-the-counter or the GoLYTELY and she wants  to try the GoLYTELY. We discussed returning to the emergency department if she gets fever, vomiting, or severe pain again.   Final Clinical Impressions(s) / ED Diagnoses   Final diagnoses:  Generalized abdominal pain  Constipation, unspecified constipation type   Meds  Golytely  Plan discharge  Devoria Albe, MD, Concha Pyo, MD 09/30/15 [phone]

## 2015-09-30 NOTE — ED Notes (Addendum)
Patient states that she had a small amount of stool pass, and that she passed some gas.  But there was not a lot of either.  MD notified.

## 2015-09-30 NOTE — ED Triage Notes (Signed)
Pt c/o generalized abd pain with nausea that started tonight. Pt states she has been having a lot gas and had bowel movement tonight.

## 2015-10-27 DIAGNOSIS — H401112 Primary open-angle glaucoma, right eye, moderate stage: Secondary | ICD-10-CM | POA: Diagnosis not present

## 2016-05-15 ENCOUNTER — Emergency Department (HOSPITAL_COMMUNITY): Payer: Medicare Other

## 2016-05-15 ENCOUNTER — Encounter (HOSPITAL_COMMUNITY): Payer: Self-pay

## 2016-05-15 ENCOUNTER — Inpatient Hospital Stay (HOSPITAL_COMMUNITY)
Admission: EM | Admit: 2016-05-15 | Discharge: 2016-05-17 | DRG: 069 | Disposition: A | Discharge status: Home / Self Care | Payer: Medicare Other | Attending: Pulmonary Disease | Admitting: Pulmonary Disease

## 2016-05-15 DIAGNOSIS — R262 Difficulty in walking, not elsewhere classified: Secondary | ICD-10-CM | POA: Diagnosis not present

## 2016-05-15 DIAGNOSIS — I6522 Occlusion and stenosis of left carotid artery: Secondary | ICD-10-CM | POA: Diagnosis not present

## 2016-05-15 DIAGNOSIS — K219 Gastro-esophageal reflux disease without esophagitis: Secondary | ICD-10-CM | POA: Diagnosis not present

## 2016-05-15 DIAGNOSIS — G459 Transient cerebral ischemic attack, unspecified: Secondary | ICD-10-CM

## 2016-05-15 DIAGNOSIS — M542 Cervicalgia: Secondary | ICD-10-CM | POA: Diagnosis not present

## 2016-05-15 DIAGNOSIS — H9190 Unspecified hearing loss, unspecified ear: Secondary | ICD-10-CM | POA: Diagnosis present

## 2016-05-15 DIAGNOSIS — R531 Weakness: Secondary | ICD-10-CM | POA: Diagnosis not present

## 2016-05-15 DIAGNOSIS — Z79899 Other long term (current) drug therapy: Secondary | ICD-10-CM

## 2016-05-15 DIAGNOSIS — E785 Hyperlipidemia, unspecified: Secondary | ICD-10-CM | POA: Diagnosis not present

## 2016-05-15 DIAGNOSIS — E039 Hypothyroidism, unspecified: Secondary | ICD-10-CM | POA: Diagnosis present

## 2016-05-15 DIAGNOSIS — G8929 Other chronic pain: Secondary | ICD-10-CM | POA: Diagnosis not present

## 2016-05-15 DIAGNOSIS — G451 Carotid artery syndrome (hemispheric): Secondary | ICD-10-CM | POA: Diagnosis not present

## 2016-05-15 DIAGNOSIS — M545 Low back pain, unspecified: Secondary | ICD-10-CM | POA: Diagnosis present

## 2016-05-15 DIAGNOSIS — G4700 Insomnia, unspecified: Secondary | ICD-10-CM | POA: Diagnosis not present

## 2016-05-15 DIAGNOSIS — R270 Ataxia, unspecified: Secondary | ICD-10-CM | POA: Diagnosis not present

## 2016-05-15 LAB — CBC
HEMATOCRIT: 40.8 % (ref 36.0–46.0)
HEMOGLOBIN: 13.8 g/dL (ref 12.0–15.0)
MCH: 31.6 pg (ref 26.0–34.0)
MCHC: 33.8 g/dL (ref 30.0–36.0)
MCV: 93.4 fL (ref 78.0–100.0)
Platelets: 304 10*3/uL (ref 150–400)
RBC: 4.37 MIL/uL (ref 3.87–5.11)
RDW: 12.5 % (ref 11.5–15.5)
WBC: 6 10*3/uL (ref 4.0–10.5)

## 2016-05-15 LAB — COMPREHENSIVE METABOLIC PANEL
ALT: 13 U/L — ABNORMAL LOW (ref 14–54)
AST: 18 U/L (ref 15–41)
Albumin: 4.2 g/dL (ref 3.5–5.0)
Alkaline Phosphatase: 110 U/L (ref 38–126)
Anion gap: 7 (ref 5–15)
BILIRUBIN TOTAL: 0.6 mg/dL (ref 0.3–1.2)
BUN: 24 mg/dL — ABNORMAL HIGH (ref 6–20)
CO2: 28 mmol/L (ref 22–32)
Calcium: 10 mg/dL (ref 8.9–10.3)
Chloride: 102 mmol/L (ref 101–111)
Creatinine, Ser: 0.77 mg/dL (ref 0.44–1.00)
GFR calc Af Amer: >60 mL/min (ref >60)
GFR calc non Af Amer: >60 mL/min (ref >60)
GLUCOSE: 138 mg/dL — AB (ref 65–99)
POTASSIUM: 4.3 mmol/L (ref 3.5–5.1)
Sodium: 137 mmol/L (ref 135–145)
TOTAL PROTEIN: 7.2 g/dL (ref 6.5–8.1)

## 2016-05-15 LAB — TSH: TSH: 12.503 u[IU]/mL — ABNORMAL HIGH (ref 0.350–4.500)

## 2016-05-15 MED ORDER — BRIMONIDINE TARTRATE 0.2 % OP SOLN
1.0000 [drp] | Freq: Every day | OPHTHALMIC | Status: DC
  Administered 2016-05-16 – 2016-05-17 (×2): 1 [drp] via OPHTHALMIC
  Filled 2016-05-15: qty 5

## 2016-05-15 MED ORDER — ASPIRIN 325 MG PO TABS
325.0000 mg | ORAL_TABLET | Freq: Once | ORAL | Status: AC
  Administered 2016-05-15: 325 mg via ORAL
  Filled 2016-05-15: qty 1

## 2016-05-15 MED ORDER — SODIUM CHLORIDE 0.9 % IV SOLN
INTRAVENOUS | Status: DC
  Administered 2016-05-15 – 2016-05-16 (×2): via INTRAVENOUS

## 2016-05-15 MED ORDER — ACETAMINOPHEN 650 MG RE SUPP: 650.0000 mg | RECTAL | Status: DC | PRN

## 2016-05-15 MED ORDER — ACETAMINOPHEN 325 MG PO TABS
650.0000 mg | ORAL_TABLET | ORAL | Status: DC | PRN
  Administered 2016-05-15 – 2016-05-16 (×2): 650 mg via ORAL
  Filled 2016-05-15 (×2): qty 2

## 2016-05-15 MED ORDER — LEVOTHYROXINE SODIUM 88 MCG PO TABS
88.0000 ug | ORAL_TABLET | Freq: Every day | ORAL | Status: DC
  Administered 2016-05-16 – 2016-05-17 (×2): 88 ug via ORAL
  Filled 2016-05-15 (×2): qty 1

## 2016-05-15 MED ORDER — ASPIRIN 325 MG PO TABS
325.0000 mg | ORAL_TABLET | Freq: Every day | ORAL | Status: DC
  Administered 2016-05-15 – 2016-05-17 (×3): 325 mg via ORAL
  Filled 2016-05-15 (×3): qty 1

## 2016-05-15 MED ORDER — ACETAMINOPHEN 160 MG/5ML PO SOLN: 650.0000 mg | ORAL | Status: DC | PRN

## 2016-05-15 MED ORDER — ASPIRIN 300 MG RE SUPP: 300.0000 mg | Freq: Every day | RECTAL | Status: DC

## 2016-05-15 MED ORDER — PANTOPRAZOLE SODIUM 20 MG PO TBEC
20.0000 mg | DELAYED_RELEASE_TABLET | Freq: Every day | ORAL | Status: DC
  Filled 2016-05-15 (×2): qty 1

## 2016-05-15 MED ORDER — SENNOSIDES-DOCUSATE SODIUM 8.6-50 MG PO TABS: 1.0000 | ORAL_TABLET | Freq: Every evening | ORAL | Status: DC | PRN

## 2016-05-15 MED ORDER — TIMOLOL MALEATE 0.5 % OP SOLN
1.0000 [drp] | Freq: Every day | OPHTHALMIC | Status: DC
  Administered 2016-05-16 – 2016-05-17 (×2): 1 [drp] via OPHTHALMIC
  Filled 2016-05-15: qty 5

## 2016-05-15 MED ORDER — ENOXAPARIN SODIUM 40 MG/0.4ML ~~LOC~~ SOLN
40.0000 mg | SUBCUTANEOUS | Status: DC
  Administered 2016-05-15 – 2016-05-16 (×2): 40 mg via SUBCUTANEOUS
  Filled 2016-05-15 (×2): qty 0.4

## 2016-05-15 MED ORDER — STROKE: EARLY STAGES OF RECOVERY BOOK
Freq: Once | Status: DC
  Filled 2016-05-15: qty 1

## 2016-05-15 NOTE — ED Notes (Signed)
Report given to Morgan RN °

## 2016-05-15 NOTE — ED Provider Notes (Signed)
[facility] Provider Note   CSN: [id number] Arrival date & time: 05/15/16  1228  By signing my name below, I, Doreatha MartinEva Mathews, attest that this documentation has been prepared under the direction and in the presence of Doug SouSam Thersia Petraglia, MD. Electronically Signed: Doreatha MartinEva Mathews, ED Scribe. 05/15/16. 1:00 PM.    History   Chief Complaint Chief Complaint  Patient presents with  . Numbness    HPI Lori George is a 83 y.o. female who presents to the Emergency Department complaining of an episode of left arm and left leg weakness that occurred at ~11AMToday and lasted ~30 minutes. Pt states her symptoms have currently resolved. Per family, pt was able to ambulate and get into their truck for transport to the ED with minimal assistance. No h/o of similar symptoms. Pt states she was feeling otherwise well today prior to the onset of her symptoms. Pt ambulates without a cane or walker at baseline without difficulty. Pt also complains of ongoing lower back pain for several weeks. Pt states her back pain is worsened with movement. She has h/o back injury and chronic back pain. Pt is a non-smoker and non-drinker. Family denies difficulty speaking. Pt denies additional complaints.   No other associated symptoms. Back pain is worse with changing positions improved with remaining still. Numbness and weakness and left arm and left leg result spontaneously without treatment  The history is provided by the patient and a relative. No language interpreter was used.    Past Medical History:  Diagnosis Date  . Thyroid disease     There are no active problems to display for this patient.   Past Surgical History:  Procedure Laterality Date  . BACK SURGERY      OB History    No data available       Home Medications    Prior to Admission medications   Medication Sig Start Date End Date Taking? Authorizing Provider  brimonidine (ALPHAGAN) 0.2 % ophthalmic solution Place 1 drop into both eyes  daily. 05/30/14   Historical Provider, MD  levothyroxine (SYNTHROID, LEVOTHROID) 88 MCG tablet Take 88 mcg by mouth daily before breakfast.    Historical Provider, MD  pantoprazole (PROTONIX) 20 MG tablet Take 1 tablet (20 mg total) by mouth daily. 07/17/14   Bethann BerkshireJoseph Zammit, MD  timolol (TIMOPTIC) 0.5 % ophthalmic solution Place 1 drop into both eyes daily. 04/18/14   Historical Provider, MD    Family History No family history on file.  Social History Social History  Substance Use Topics  . Smoking status: Never Smoker  . Smokeless tobacco: Never Used  . Alcohol use No     Allergies   Patient has no known allergies.   Review of Systems Review of Systems  HENT: Negative.   Respiratory: Negative.   Cardiovascular: Negative.   Gastrointestinal: Negative.   Musculoskeletal: Positive for back pain.  Skin: Negative.   Neurological: Positive for weakness and numbness. Negative for speech difficulty.  Psychiatric/Behavioral: Negative.   All other systems reviewed and are negative.   Physical Exam Updated Vital Signs BP (!) 170/79 (BP Location: Left Arm)   Pulse 77   Temp 97.6 F (36.4 C) (Axillary)   Resp 20   Ht 5\' 4"  (1.626 m)   Wt 126 lb (57.2 kg)   SpO2 99%   BMI 21.63 kg/m   Physical Exam  Constitutional: She is oriented to person, place, and time. She appears well-developed and well-nourished.  HENT:  Head: Normocephalic and atraumatic.  Eyes:  Conjunctivae are normal. Pupils are equal, round, and reactive to light.  Neck: Neck supple. No tracheal deviation present. No thyromegaly present.  Cardiovascular: Normal rate and regular rhythm.   No murmur heard. Pulmonary/Chest: Effort normal and breath sounds normal.  Abdominal: Soft. Bowel sounds are normal. She exhibits no distension. There is no tenderness.  Musculoskeletal: Normal range of motion. She exhibits no edema or tenderness.  Neurological: She is alert and oriented to person, place, and time. She displays  normal reflexes. No cranial nerve deficit. Coordination normal.  Motor strength 5 over 5 overall gait normal Romberg normal pronator drift normal. DTR symmetric bilaterally at knee jerk ankle jerk and biceps toes downgoing bilaterally cranial nerves II through XII grossly intact  Skin: Skin is warm and dry. No rash noted.  Psychiatric: She has a normal mood and affect.  Nursing note and vitals reviewed.   ED Treatments / Results   DIAGNOSTIC STUDIES: Oxygen Saturation is 99% on RA, normal by my interpretation.    COORDINATION OF CARE: 12:56 PM Discussed treatment plan with pt at bedside which includes hospital admission and pt agreed to plan.    Labs (all labs ordered are listed, but only abnormal results are displayed) Labs Reviewed - No data to display  EKG  EKG Interpretation  Date/Time:  Saturday May 15 2016 12:50:59 EDT Ventricular Rate:  73 PR Interval:    QRS Duration: 88 QT Interval:  385 QTC Calculation: 425 R Axis:   60 Text Interpretation:  Sinus rhythm No significant change since last tracing Confirmed by Ethelda Chick  MD, Nakisha Chai [phone]) on 05/15/2016 1:04:03 PM      Results for orders placed or performed during the hospital encounter of 05/15/16  Comprehensive metabolic panel  Result Value Ref Range   Sodium 137 135 - 145 mmol/L   Potassium 4.3 3.5 - 5.1 mmol/L   Chloride 102 101 - 111 mmol/L   CO2 28 22 - 32 mmol/L   Glucose, Bld 138 (H) 65 - 99 mg/dL   BUN 24 (H) 6 - 20 mg/dL   Creatinine, Ser 0.86 0.44 - 1.00 mg/dL   Calcium 57.8 8.9 - 46.9 mg/dL   Total Protein 7.2 6.5 - 8.1 g/dL   Albumin 4.2 3.5 - 5.0 g/dL   AST 18 15 - 41 U/L   ALT 13 (L) 14 - 54 U/L   Alkaline Phosphatase 110 38 - 126 U/L   Total Bilirubin 0.6 0.3 - 1.2 mg/dL   GFR calc non Af Amer >60 >60 mL/min   GFR calc Af Amer >60 >60 mL/min   Anion gap 7 5 - 15  CBC  Result Value Ref Range   WBC 6.0 4.0 - 10.5 K/uL   RBC 4.37 3.87 - 5.11 MIL/uL   Hemoglobin 13.8 12.0 - 15.0 g/dL   HCT  62.9 52.8 - 41.3 %   MCV 93.4 78.0 - 100.0 fL   MCH 31.6 26.0 - 34.0 pg   MCHC 33.8 30.0 - 36.0 g/dL   RDW 24.4 01.0 - 27.2 %   Platelets 304 150 - 400 K/uL   Ct Head Wo Contrast  Result Date: 05/15/2016 CLINICAL DATA:  Sudden onset left arm and leg weakness at 11 a.m. today. The episode lasted approximately 30 minutes. EXAM: CT HEAD WITHOUT CONTRAST TECHNIQUE: Contiguous axial images were obtained from the base of the skull through the vertex without intravenous contrast. COMPARISON:  None. FINDINGS: Brain: Diffusely enlarged ventricles and subarachnoid spaces. Patchy white matter low density in both  cerebral hemispheres. No intracranial hemorrhage, mass lesion or CT evidence of acute infarction. Vascular: No hyperdense vessel or unexpected calcification. Skull: Normal. Negative for fracture or focal lesion. Sinuses/Orbits: Small amount of right sphenoid sinus mucosal thickening. Unremarkable orbits. Other: None. IMPRESSION: No acute abnormality. Minimal atrophy and minimal chronic small vessel white matter ischemic changes in both cerebral hemispheres. Electronically Signed   By: Beckie Salts M.D.   On: 05/15/2016 13:42    Radiology No results found.  Procedures Procedures (including critical care time)  Medications Ordered in ED Medications - No data to display   Initial Impression / Assessment and Plan / ED Course  I have reviewed the triage vital signs and the nursing notes.  Pertinent labs & imaging results that were available during my care of the patient were reviewed by me and considered in my medical decision making (see chart for details).     2:05 PM patient remains asymptomatic. She is alert appropriate Glasgow Coma Score 15. Patient passed swallowing screen. Aspirin ordered. Dr.Le hospitalist service consulted and will see patient in the ED and arrange for overnight stay Back pain is chronic and felt to be musculoskeletal in etiology Final Clinical Impressions(s) / ED  Diagnoses  Diagnosis #1transient ischemic attack #2 low back pain Final diagnoses:  None    New Prescriptions New Prescriptions   No medications on file    I personally performed the services described in this documentation, which was scribed in my presence. The recorded information has been reviewed and considered.     Doug Sou, MD 05/15/16 [phone]

## 2016-05-15 NOTE — H&P (Signed)
History and Physical    Lori George UJW:[id number] DOB: [birth year] DOA: 05/15/2016  PCP: Fredirick Maudlin, MD  Patient coming from: Home.    Chief Complaint: Leftsided paralysis, transient.   HPI: Lori George is an right handed 83 y.o. female, with hx hypothyroidism, chronic low back pain, presented to the ER with Ictus 3-4 hours PTA, left sided weakness lasting close to 30 minutes, with no visual changes, HA, facial droop, confusion or slurred speech.  She was not a candidate for TPA, as her symptoms resolved.  She has not been on ASA, and only take thyroid supplements, eye drops, and a PPI.  Work up in the ER included an EKG, showing NSR, a negative head CT, and unremarkable serology.  Bedside swallow was done, and it was negative.  She was given an ASA and hospitalist was asked to admit her for TIA work up. She does not have DM, HLD, HTN, or prior CVA.  Her ABCD2 score is a 2.   ED Course:  See above.  Rewiew of Systems:  Constitutional: Negative for malaise, fever and chills. No significant weight loss or weight gain Eyes: Negative for eye pain, redness and discharge, diplopia, visual changes, or flashes of light. ENMT: Negative for ear pain, hoarseness, nasal congestion, sinus pressure and sore throat. No headaches; tinnitus, drooling, or problem swallowing. Cardiovascular: Negative for chest pain, palpitations, diaphoresis, dyspnea and peripheral edema. ; No orthopnea, PND Respiratory: Negative for cough, hemoptysis, wheezing and stridor. No pleuritic chestpain. Gastrointestinal: Negative for diarrhea, constipation,  melena, blood in stool, hematemesis, jaundice and rectal bleeding.    Genitourinary: Negative for frequency, dysuria, incontinence,flank pain and hematuria; Musculoskeletal: Negative for back pain and neck pain. Negative for swelling and trauma.;  Skin: . Negative for pruritus, rash, abrasions, bruising and skin lesion.; ulcerations Neuro: Negative for headache,  lightheadedness and neck stiffness. Negative for weakness, altered level of consciousness , altered mental status, burning feet, involuntary movement, seizure and syncope.  Psych: negative for anxiety, depression, insomnia, tearfulness, panic attacks, hallucinations, paranoia, suicidal or homicidal ideation    Past Medical History:  Diagnosis Date  . Thyroid disease     Past Surgical History:  Procedure Laterality Date  . BACK SURGERY       reports that she has never smoked. She has never used smokeless tobacco. She reports that she does not drink alcohol or use drugs.  No Known Allergies  History reviewed. No pertinent family history.   Prior to Admission medications   Medication Sig Start Date End Date Taking? Authorizing Provider  brimonidine (ALPHAGAN) 0.2 % ophthalmic solution Place 1 drop into both eyes daily. 05/30/14  Yes Historical Provider, MD  levothyroxine (SYNTHROID, LEVOTHROID) 88 MCG tablet Take 88 mcg by mouth daily before breakfast.   Yes Historical Provider, MD  pantoprazole (PROTONIX) 20 MG tablet Take 1 tablet (20 mg total) by mouth daily. 07/17/14  Yes Bethann Berkshire, MD  timolol (TIMOPTIC) 0.5 % ophthalmic solution Place 1 drop into both eyes daily. 04/18/14  Yes Historical Provider, MD    Physical Exam: Vitals:   05/15/16 1330 05/15/16 1345 05/15/16 1400 05/15/16 1430  BP:  (!) 158/84 (!) 144/72 (!) 145/80  Pulse: 74  70 79  Resp: (!) 23 (!) 25 10 (!) 21  Temp:      TempSrc:      SpO2: 99%  99% 98%  Weight:      Height:          Constitutional: NAD, calm, comfortable Vitals:  05/15/16 1330 05/15/16 1345 05/15/16 1400 05/15/16 1430  BP:  (!) 158/84 (!) 144/72 (!) 145/80  Pulse: 74  70 79  Resp: (!) 23 (!) 25 10 (!) 21  Temp:      TempSrc:      SpO2: 99%  99% 98%  Weight:      Height:       Eyes: PERRL, lids and conjunctivae normal ENMT: Mucous membranes are moist. Posterior pharynx clear of any exudate or lesions.Normal dentition.  Neck:  normal, supple, no masses, no thyromegaly Respiratory: clear to auscultation bilaterally, no wheezing, no crackles. Normal respiratory effort. No accessory muscle use.  Cardiovascular: Regular rate and rhythm, no murmurs / rubs / gallops. No extremity edema. 2+ pedal pulses. No carotid bruits.  Abdomen: no tenderness, no masses palpated. No hepatosplenomegaly. Bowel sounds positive.  Musculoskeletal: no clubbing / cyanosis. No joint deformity upper and lower extremities. Good ROM, no contractures. Normal muscle tone.  Skin: no rashes, lesions, ulcers. No induration Neurologic: CN 2-12 grossly intact. Sensation intact, DTR normal. Strength 5/5 in all 4.  Psychiatric: Normal judgment and insight. Alert and oriented x 3. Normal mood.   Labs on Admission: I have personally reviewed following labs and imaging studies CBC:  Recent Labs Lab 05/15/16 1312  WBC 6.0  HGB 13.8  HCT 40.8  MCV 93.4  PLT 304   Basic Metabolic Panel:  Recent Labs Lab 05/15/16 1312  NA 137  K 4.3  CL 102  CO2 28  GLUCOSE 138*  BUN 24*  CREATININE 0.77  CALCIUM 10.0   GFR: Estimated Creatinine Clearance: 47.6 mL/min (by C-G formula based on SCr of 0.77 mg/dL). Liver Function Tests:  Recent Labs Lab 05/15/16 1312  AST 18  ALT 13*  ALKPHOS 110  BILITOT 0.6  PROT 7.2  ALBUMIN 4.2    Radiological Exams on Admission: Ct Head Wo Contrast  Result Date: 05/15/2016 CLINICAL DATA:  Sudden onset left arm and leg weakness at 11 a.m. today. The episode lasted approximately 30 minutes. EXAM: CT HEAD WITHOUT CONTRAST TECHNIQUE: Contiguous axial images were obtained from the base of the skull through the vertex without intravenous contrast. COMPARISON:  None. FINDINGS: Brain: Diffusely enlarged ventricles and subarachnoid spaces. Patchy white matter low density in both cerebral hemispheres. No intracranial hemorrhage, mass lesion or CT evidence of acute infarction. Vascular: No hyperdense vessel or unexpected  calcification. Skull: Normal. Negative for fracture or focal lesion. Sinuses/Orbits: Small amount of right sphenoid sinus mucosal thickening. Unremarkable orbits. Other: None. IMPRESSION: No acute abnormality. Minimal atrophy and minimal chronic small vessel white matter ischemic changes in both cerebral hemispheres. Electronically Signed   By: Beckie SaltsSteven  Reid M.D.   On: 05/15/2016 13:42    EKG: Independently reviewed.   Assessment/Plan Active Problems:   TIA (transient ischemic attack)   Low back pain   Hypothyroidism    PLAN:   Right hemispheric TIA:  Will admit for TIA/CVA work up.  She hadn't been on ASA, so will start one daily.  Obtain MRI/MRA of the brain, cardiac ECHO, and carotid US.  Will obtain lipid profile.  PT/OT consulted, and will consult neurology as well.   Will allow permissive HTN at this time.   Hypothyroidism:  Will continue with supplement.  Check TSH.  Low back pain:  Stable.    DVT prophylaxis: Lovenox.  Code Status: FULL CODE.  Family Communication: son, daughter in laws, and granddaughter at bedside.  Disposition Plan: to home.  Consults called: None.  Admission status:  Inpatient.    Shasta Chinn MD FACP. [facility]  If 7PM-7AM, please contact night-coverage [email or web address] Password [email or web address]  05/15/2016, 3:12 PM

## 2016-05-15 NOTE — ED Triage Notes (Signed)
Patient reports of numbness to the left side that started this morning, unknown onset of time. Patient reports that symptoms has resolved. No deficits noted. Also complains of lower back pain.

## 2016-05-15 NOTE — ED Notes (Signed)
Attempted to call report

## 2016-05-15 NOTE — ED Notes (Signed)
This nurse did bedside NIH with score of 0. Pt could moves left arm and left leg without difficulty. Pt had equal feeling in both sides. No speech problems noted.

## 2016-05-16 ENCOUNTER — Inpatient Hospital Stay (HOSPITAL_COMMUNITY): Payer: Medicare Other

## 2016-05-16 LAB — LIPID PANEL
CHOL/HDL RATIO: 3.4 ratio
Cholesterol: 189 mg/dL (ref 0–200)
HDL: 56 mg/dL (ref >40)
LDL CALC: 122 mg/dL — AB (ref 0–99)
Triglycerides: 57 mg/dL (ref <150)
VLDL: 11 mg/dL (ref 0–40)

## 2016-05-16 MED ORDER — PANTOPRAZOLE SODIUM 40 MG PO TBEC
40.0000 mg | DELAYED_RELEASE_TABLET | Freq: Every day | ORAL | Status: DC
  Administered 2016-05-16 – 2016-05-17 (×2): 40 mg via ORAL
  Filled 2016-05-16 (×2): qty 1

## 2016-05-16 NOTE — Plan of Care (Signed)
Problem: Self-Care: Goal: Ability to participate in self-care as condition permits will improve Outcome: Progressing Patient alert and oriented x 4, currently independent, and lives home alone.  Patient has strong support group with family.

## 2016-05-16 NOTE — Progress Notes (Signed)
Pt to US/MRI.

## 2016-05-16 NOTE — Progress Notes (Signed)
Phone Note  Spoke with Caralyn GuileSandra RN concerning pt's current status. She states the pt is at her baseline and is up and moving without difficulty. She also states that the pt's family is pleased with her current level of function. She is in agreement that there are no acute PT needs at this time, and PT will plan to formally evaluate the pt tomorrow if she has not been discharged already.   10:50 AM,05/16/16 Marylyn IshiharaSara Kiser PT, DPT [facility]

## 2016-05-16 NOTE — Progress Notes (Signed)
Stroke booklet given to pt and reviewed w/pt and family members.

## 2016-05-16 NOTE — Progress Notes (Signed)
Subjective: She was admitted with TIA. She had left hemiparesis which resolved spontaneously. CT in the emergency department which I personally reviewed did not show an acute stroke. She was admitted for completion of workup of TIA and treatment. This morning she has no complaints. No weakness. No slurred speech.  Objective: Vital signs in last 24 hours: Temp:  [97 F (36.1 C)-98.7 F (37.1 C)] 98.3 F (36.8 C) (03/18 0845) Pulse Rate:  [61-84] 70 (03/18 0845) Resp:  [10-25] 18 (03/18 0845) BP: (115-170)/(52-84) 125/52 (03/18 0845) SpO2:  [97 %-100 %] 98 % (03/18 0845) Weight:  [56.7 kg (125 lb)-57.2 kg (126 lb)] 56.7 kg (125 lb) (03/17 1645) Weight change:  Last BM Date: 05/15/16  Intake/Output from previous day: 03/17 0701 - 03/18 0700 In: 625 [P.O.:120; I.V.:505] Out: -   PHYSICAL EXAM General appearance: alert, cooperative and no distress Resp: clear to auscultation bilaterally Cardio: regular rate and rhythm, S1, S2 normal, no murmur, click, rub or gallop GI: soft, non-tender; bowel sounds normal; no masses,  no organomegaly Extremities: extremities normal, atraumatic, no cyanosis or edema Neurologically intact except she is hard of hearing  Lab Results:  Results for orders placed or performed during the hospital encounter of 05/15/16 (from the past 48 hour(s))  Comprehensive metabolic panel     Status: Abnormal   Collection Time: 05/15/16  1:12 PM  Result Value Ref Range   Sodium 137 135 - 145 mmol/L   Potassium 4.3 3.5 - 5.1 mmol/L   Chloride 102 101 - 111 mmol/L   CO2 28 22 - 32 mmol/L   Glucose, Bld 138 (H) 65 - 99 mg/dL   BUN 24 (H) 6 - 20 mg/dL   Creatinine, Ser 0.77 0.44 - 1.00 mg/dL   Calcium 10.0 8.9 - 10.3 mg/dL   Total Protein 7.2 6.5 - 8.1 g/dL   Albumin 4.2 3.5 - 5.0 g/dL   AST 18 15 - 41 U/L   ALT 13 (L) 14 - 54 U/L   Alkaline Phosphatase 110 38 - 126 U/L   Total Bilirubin 0.6 0.3 - 1.2 mg/dL   GFR calc non Af Amer >60 >60 mL/min   GFR calc Af  Amer >60 >60 mL/min    Comment: (NOTE) The eGFR has been calculated using the CKD EPI equation. This calculation has not been validated in all clinical situations. eGFR's persistently <60 mL/min signify possible Chronic Kidney Disease.    Anion gap 7 5 - 15  CBC     Status: None   Collection Time: 05/15/16  1:12 PM  Result Value Ref Range   WBC 6.0 4.0 - 10.5 K/uL   RBC 4.37 3.87 - 5.11 MIL/uL   Hemoglobin 13.8 12.0 - 15.0 g/dL   HCT 40.8 36.0 - 46.0 %   MCV 93.4 78.0 - 100.0 fL   MCH 31.6 26.0 - 34.0 pg   MCHC 33.8 30.0 - 36.0 g/dL   RDW 12.5 11.5 - 15.5 %   Platelets 304 150 - 400 K/uL  TSH     Status: Abnormal   Collection Time: 05/15/16  1:12 PM  Result Value Ref Range   TSH 12.503 (H) 0.350 - 4.500 uIU/mL    Comment: Performed by a 3rd Generation assay with a functional sensitivity of <=0.01 uIU/mL.  Lipid panel     Status: Abnormal   Collection Time: 05/16/16  6:45 AM  Result Value Ref Range   Cholesterol 189 0 - 200 mg/dL   Triglycerides 57 <150 mg/dL  HDL 56 >40 mg/dL   Total CHOL/HDL Ratio 3.4 RATIO   VLDL 11 0 - 40 mg/dL   LDL Cholesterol 122 (H) 0 - 99 mg/dL    Comment:        Total Cholesterol/HDL:CHD Risk Coronary Heart Disease Risk Table                     Men   Women  1/2 Average Risk   3.4   3.3  Average Risk       5.0   4.4  2 X Average Risk   9.6   7.1  3 X Average Risk  23.4   11.0        Use the calculated Patient Ratio above and the CHD Risk Table to determine the patient's CHD Risk.        ATP III CLASSIFICATION (LDL):  <100     mg/dL   Optimal  100-129  mg/dL   Near or Above                    Optimal  130-159  mg/dL   Borderline  160-189  mg/dL   High  >190     mg/dL   Very High     ABGS No results for input(s): PHART, PO2ART, TCO2, HCO3 in the last 72 hours.  Invalid input(s): PCO2 CULTURES No results found for this or any previous visit (from the past 240 hour(s)). Studies/Results: Ct Head Wo Contrast  Result Date:  05/15/2016 CLINICAL DATA:  Sudden onset left arm and leg weakness at 11 a.m. today. The episode lasted approximately 30 minutes. EXAM: CT HEAD WITHOUT CONTRAST TECHNIQUE: Contiguous axial images were obtained from the base of the skull through the vertex without intravenous contrast. COMPARISON:  None. FINDINGS: Brain: Diffusely enlarged ventricles and subarachnoid spaces. Patchy white matter low density in both cerebral hemispheres. No intracranial hemorrhage, mass lesion or CT evidence of acute infarction. Vascular: No hyperdense vessel or unexpected calcification. Skull: Normal. Negative for fracture or focal lesion. Sinuses/Orbits: Small amount of right sphenoid sinus mucosal thickening. Unremarkable orbits. Other: None. IMPRESSION: No acute abnormality. Minimal atrophy and minimal chronic small vessel white matter ischemic changes in both cerebral hemispheres. Electronically Signed   By: Claudie Revering M.D.   On: 05/15/2016 13:42   Mr Brain Wo Contrast  Result Date: 05/16/2016 CLINICAL DATA:  Acute admission with left-sided weakness. EXAM: MRI HEAD WITHOUT CONTRAST MRA HEAD WITHOUT CONTRAST TECHNIQUE: Multiplanar, multiecho pulse sequences of the brain and surrounding structures were obtained without intravenous contrast. Angiographic images of the head were obtained using MRA technique without contrast. COMPARISON:  CT 05/15/2016 FINDINGS: MRI HEAD FINDINGS Brain: Diffusion imaging does not show any acute or subacute infarction. Mild chronic small-vessel ischemic changes affect the pons. No focal cerebellar insult. Cerebral hemispheres show mild chronic small-vessel ischemic change of the deep and subcortical white matter. No cortical or large vessel territory infarction. No mass lesion, hemorrhage, hydrocephalus or extra-axial collection. Vascular: Major vessels at the base of the brain show flow. Skull and upper cervical spine: Negative Sinuses/Orbits: Clear/ normal Other: None significant MRA HEAD  FINDINGS Both internal carotid arteries are widely patent through the siphon regions. The anterior and middle cerebral vessels are normal without proximal stenosis, aneurysm or vascular malformation. The right vertebral artery is a large vessel widely patent to the basilar. There is retrograde flow in the small distal left vertebral artery supplying PICA/ AI cup. No antegrade flow in the left  vertebral artery. This could be due to developmental anatomy or there could be chronic left vertebral occlusion. No basilar stenosis. Posterior circulation branch vessels are intact. Patent posterior communicating arteries bilaterally. IMPRESSION: No acute brain finding. Mild chronic small-vessel ischemic changes affecting the pons and cerebral hemispheric white matter. No significant anterior circulation finding. Large right vertebral artery supplies the basilar. Apparent retrograde flow in the distal left vertebral artery. This could be due to more proximal left vertebral artery occlusion or could be a congenital variation. Electronically Signed   By: Nelson Chimes M.D.   On: 05/16/2016 10:41   Mr Jodene Nam Head/brain IH Cm  Result Date: 05/16/2016 CLINICAL DATA:  Acute admission with left-sided weakness. EXAM: MRI HEAD WITHOUT CONTRAST MRA HEAD WITHOUT CONTRAST TECHNIQUE: Multiplanar, multiecho pulse sequences of the brain and surrounding structures were obtained without intravenous contrast. Angiographic images of the head were obtained using MRA technique without contrast. COMPARISON:  CT 05/15/2016 FINDINGS: MRI HEAD FINDINGS Brain: Diffusion imaging does not show any acute or subacute infarction. Mild chronic small-vessel ischemic changes affect the pons. No focal cerebellar insult. Cerebral hemispheres show mild chronic small-vessel ischemic change of the deep and subcortical white matter. No cortical or large vessel territory infarction. No mass lesion, hemorrhage, hydrocephalus or extra-axial collection. Vascular: Major  vessels at the base of the brain show flow. Skull and upper cervical spine: Negative Sinuses/Orbits: Clear/ normal Other: None significant MRA HEAD FINDINGS Both internal carotid arteries are widely patent through the siphon regions. The anterior and middle cerebral vessels are normal without proximal stenosis, aneurysm or vascular malformation. The right vertebral artery is a large vessel widely patent to the basilar. There is retrograde flow in the small distal left vertebral artery supplying PICA/ AI cup. No antegrade flow in the left vertebral artery. This could be due to developmental anatomy or there could be chronic left vertebral occlusion. No basilar stenosis. Posterior circulation branch vessels are intact. Patent posterior communicating arteries bilaterally. IMPRESSION: No acute brain finding. Mild chronic small-vessel ischemic changes affecting the pons and cerebral hemispheric white matter. No significant anterior circulation finding. Large right vertebral artery supplies the basilar. Apparent retrograde flow in the distal left vertebral artery. This could be due to more proximal left vertebral artery occlusion or could be a congenital variation. Electronically Signed   By: Nelson Chimes M.D.   On: 05/16/2016 10:41    Medications:  Prior to Admission:  Prescriptions Prior to Admission  Medication Sig Dispense Refill Last Dose  . brimonidine (ALPHAGAN) 0.2 % ophthalmic solution Place 1 drop into both eyes daily.   05/15/2016 at Unknown time  . levothyroxine (SYNTHROID, LEVOTHROID) 88 MCG tablet Take 88 mcg by mouth daily before breakfast.   05/15/2016 at Unknown time  . pantoprazole (PROTONIX) 20 MG tablet Take 1 tablet (20 mg total) by mouth daily. 30 tablet 0 05/15/2016 at Unknown time  . timolol (TIMOPTIC) 0.5 % ophthalmic solution Place 1 drop into both eyes daily.   05/15/2016 at Unknown time   Scheduled: .  stroke: mapping our early stages of recovery book   Does not apply Once  . aspirin   300 mg Rectal Daily   Or  . aspirin  325 mg Oral Daily  . brimonidine  1 drop Both Eyes Daily  . enoxaparin (LOVENOX) injection  40 mg Subcutaneous Q24H  . levothyroxine  88 mcg Oral QAC breakfast  . pantoprazole  40 mg Oral Daily  . timolol  1 drop Both Eyes Daily   Continuous: .  sodium chloride 50 mL/hr at 05/15/16 1754   UXN:[id number] **OR** acetaminophen (TYLENOL) oral liquid 160 mg/5 mL **OR** acetaminophen, senna-docusate  Assesment: She had a TIA. She has had MRI and ultrasound but results are pending. She has not had echocardiogram yet. Neurologically she is back to baseline. Active Problems:   TIA (transient ischemic attack)   Low back pain   Hypothyroidism    Plan: Await results of studies    LOS: 1 day   Gates Jividen L 05/16/2016, 10:49 AM

## 2016-05-16 NOTE — Plan of Care (Signed)
Problem: Nutrition: Goal: Risk of aspiration will decrease Outcome: Adequate for Discharge Patient passed swallow screen, has no difficulty swallowing.

## 2016-05-17 ENCOUNTER — Inpatient Hospital Stay (HOSPITAL_COMMUNITY): Payer: Medicare Other

## 2016-05-17 DIAGNOSIS — R270 Ataxia, unspecified: Secondary | ICD-10-CM | POA: Diagnosis not present

## 2016-05-17 DIAGNOSIS — K219 Gastro-esophageal reflux disease without esophagitis: Secondary | ICD-10-CM | POA: Diagnosis present

## 2016-05-17 DIAGNOSIS — G459 Transient cerebral ischemic attack, unspecified: Secondary | ICD-10-CM

## 2016-05-17 DIAGNOSIS — G4700 Insomnia, unspecified: Secondary | ICD-10-CM | POA: Diagnosis not present

## 2016-05-17 DIAGNOSIS — E785 Hyperlipidemia, unspecified: Secondary | ICD-10-CM | POA: Diagnosis present

## 2016-05-17 LAB — ECHOCARDIOGRAM COMPLETE
Height: 64 in
Weight: 2000 oz

## 2016-05-17 LAB — HEMOGLOBIN A1C
Hgb A1c MFr Bld: 5.5 % (ref 4.8–5.6)
Mean Plasma Glucose: 111 mg/dL

## 2016-05-17 MED ORDER — LEVOTHYROXINE SODIUM 100 MCG PO TABS: 88.0000 ug | ORAL_TABLET | Freq: Every day | ORAL | 12 refills | Status: DC

## 2016-05-17 MED ORDER — ASPIRIN 325 MG PO TABS: 325.0000 mg | ORAL_TABLET | Freq: Every day | ORAL | 12 refills | Status: DC

## 2016-05-17 MED ORDER — ATORVASTATIN CALCIUM 20 MG PO TABS: 20.0000 mg | ORAL_TABLET | Freq: Every day | ORAL | 12 refills | Status: DC

## 2016-05-17 NOTE — Progress Notes (Signed)
Subjective: She feels well. No complaints. She had some neck pain last night but that is gone. No weakness. No slurred speech. No difficulty swallowing.  Objective: Vital signs in last 24 hours: Temp:  [98.3 F (36.8 C)-98.8 F (37.1 C)] 98.4 F (36.9 C) (03/19 0445) Pulse Rate:  [65-71] 68 (03/19 0445) Resp:  [16-18] 16 (03/19 0445) BP: (114-149)/(52-87) 118/65 (03/19 0445) SpO2:  [94 %-98 %] 95 % (03/19 0445) Weight change:  Last BM Date: 05/15/16  Intake/Output from previous day: 03/18 0701 - 03/19 0700 In: 1460 [P.O.:360; I.V.:1100] Out: -   PHYSICAL EXAM General appearance: alert, cooperative and no distress Resp: clear to auscultation bilaterally Cardio: regular rate and rhythm, S1, S2 normal, no murmur, click, rub or gallop GI: soft, non-tender; bowel sounds normal; no masses,  no organomegaly Extremities: extremities normal, atraumatic, no cyanosis or edema She is hard of hearing. Otherwise neurologically intact. Mucous membranes are moist  Lab Results:  Results for orders placed or performed during the hospital encounter of 05/15/16 (from the past 48 hour(s))  Comprehensive metabolic panel     Status: Abnormal   Collection Time: 05/15/16  1:12 PM  Result Value Ref Range   Sodium 137 135 - 145 mmol/L   Potassium 4.3 3.5 - 5.1 mmol/L   Chloride 102 101 - 111 mmol/L   CO2 28 22 - 32 mmol/L   Glucose, Bld 138 (H) 65 - 99 mg/dL   BUN 24 (H) 6 - 20 mg/dL   Creatinine, Ser 0.77 0.44 - 1.00 mg/dL   Calcium 10.0 8.9 - 10.3 mg/dL   Total Protein 7.2 6.5 - 8.1 g/dL   Albumin 4.2 3.5 - 5.0 g/dL   AST 18 15 - 41 U/L   ALT 13 (L) 14 - 54 U/L   Alkaline Phosphatase 110 38 - 126 U/L   Total Bilirubin 0.6 0.3 - 1.2 mg/dL   GFR calc non Af Amer >60 >60 mL/min   GFR calc Af Amer >60 >60 mL/min    Comment: (NOTE) The eGFR has been calculated using the CKD EPI equation. This calculation has not been validated in all clinical situations. eGFR's persistently <60 mL/min signify  possible Chronic Kidney Disease.    Anion gap 7 5 - 15  CBC     Status: None   Collection Time: 05/15/16  1:12 PM  Result Value Ref Range   WBC 6.0 4.0 - 10.5 K/uL   RBC 4.37 3.87 - 5.11 MIL/uL   Hemoglobin 13.8 12.0 - 15.0 g/dL   HCT 40.8 36.0 - 46.0 %   MCV 93.4 78.0 - 100.0 fL   MCH 31.6 26.0 - 34.0 pg   MCHC 33.8 30.0 - 36.0 g/dL   RDW 12.5 11.5 - 15.5 %   Platelets 304 150 - 400 K/uL  TSH     Status: Abnormal   Collection Time: 05/15/16  1:12 PM  Result Value Ref Range   TSH 12.503 (H) 0.350 - 4.500 uIU/mL    Comment: Performed by a 3rd Generation assay with a functional sensitivity of <=0.01 uIU/mL.  Lipid panel     Status: Abnormal   Collection Time: 05/16/16  6:45 AM  Result Value Ref Range   Cholesterol 189 0 - 200 mg/dL   Triglycerides 57 <150 mg/dL   HDL 56 >40 mg/dL   Total CHOL/HDL Ratio 3.4 RATIO   VLDL 11 0 - 40 mg/dL   LDL Cholesterol 122 (H) 0 - 99 mg/dL    Comment:  Total Cholesterol/HDL:CHD Risk Coronary Heart Disease Risk Table                     Men   Women  1/2 Average Risk   3.4   3.3  Average Risk       5.0   4.4  2 X Average Risk   9.6   7.1  3 X Average Risk  23.4   11.0        Use the calculated Patient Ratio above and the CHD Risk Table to determine the patient's CHD Risk.        ATP III CLASSIFICATION (LDL):  <100     mg/dL   Optimal  100-129  mg/dL   Near or Above                    Optimal  130-159  mg/dL   Borderline  160-189  mg/dL   High  >190     mg/dL   Very High     ABGS No results for input(s): PHART, PO2ART, TCO2, HCO3 in the last 72 hours.  Invalid input(s): PCO2 CULTURES No results found for this or any previous visit (from the past 240 hour(s)). Studies/Results: Ct Head Wo Contrast  Result Date: 05/15/2016 CLINICAL DATA:  Sudden onset left arm and leg weakness at 11 a.m. today. The episode lasted approximately 30 minutes. EXAM: CT HEAD WITHOUT CONTRAST TECHNIQUE: Contiguous axial images were obtained from  the base of the skull through the vertex without intravenous contrast. COMPARISON:  None. FINDINGS: Brain: Diffusely enlarged ventricles and subarachnoid spaces. Patchy white matter low density in both cerebral hemispheres. No intracranial hemorrhage, mass lesion or CT evidence of acute infarction. Vascular: No hyperdense vessel or unexpected calcification. Skull: Normal. Negative for fracture or focal lesion. Sinuses/Orbits: Small amount of right sphenoid sinus mucosal thickening. Unremarkable orbits. Other: None. IMPRESSION: No acute abnormality. Minimal atrophy and minimal chronic small vessel white matter ischemic changes in both cerebral hemispheres. Electronically Signed   By: Claudie Revering M.D.   On: 05/15/2016 13:42   Mr Brain Wo Contrast  Result Date: 05/16/2016 CLINICAL DATA:  Acute admission with left-sided weakness. EXAM: MRI HEAD WITHOUT CONTRAST MRA HEAD WITHOUT CONTRAST TECHNIQUE: Multiplanar, multiecho pulse sequences of the brain and surrounding structures were obtained without intravenous contrast. Angiographic images of the head were obtained using MRA technique without contrast. COMPARISON:  CT 05/15/2016 FINDINGS: MRI HEAD FINDINGS Brain: Diffusion imaging does not show any acute or subacute infarction. Mild chronic small-vessel ischemic changes affect the pons. No focal cerebellar insult. Cerebral hemispheres show mild chronic small-vessel ischemic change of the deep and subcortical white matter. No cortical or large vessel territory infarction. No mass lesion, hemorrhage, hydrocephalus or extra-axial collection. Vascular: Major vessels at the base of the brain show flow. Skull and upper cervical spine: Negative Sinuses/Orbits: Clear/ normal Other: None significant MRA HEAD FINDINGS Both internal carotid arteries are widely patent through the siphon regions. The anterior and middle cerebral vessels are normal without proximal stenosis, aneurysm or vascular malformation. The right vertebral  artery is a large vessel widely patent to the basilar. There is retrograde flow in the small distal left vertebral artery supplying PICA/ AI cup. No antegrade flow in the left vertebral artery. This could be due to developmental anatomy or there could be chronic left vertebral occlusion. No basilar stenosis. Posterior circulation branch vessels are intact. Patent posterior communicating arteries bilaterally. IMPRESSION: No acute brain finding. Mild chronic small-vessel ischemic  changes affecting the pons and cerebral hemispheric white matter. No significant anterior circulation finding. Large right vertebral artery supplies the basilar. Apparent retrograde flow in the distal left vertebral artery. This could be due to more proximal left vertebral artery occlusion or could be a congenital variation. Electronically Signed   By: Nelson Chimes M.D.   On: 05/16/2016 10:41   US Carotid Bilateral (at [facility] Only)  Result Date: 05/16/2016 CLINICAL DATA:  83 year old female with stroke-like symptoms EXAM: BILATERAL CAROTID DUPLEX ULTRASOUND TECHNIQUE: Pearline Cables scale imaging, color Doppler and duplex ultrasound were performed of bilateral carotid and vertebral arteries in the neck. COMPARISON:  Brain MRI obtained concurrently FINDINGS: Criteria: Quantification of carotid stenosis is based on velocity parameters that correlate the residual internal carotid diameter with NASCET-based stenosis levels, using the diameter of the distal internal carotid lumen as the denominator for stenosis measurement. The following velocity measurements were obtained: RIGHT ICA:  91/14 cm/sec CCA:  37/48 cm/sec SYSTOLIC ICA/CCA RATIO:  0.9 DIASTOLIC ICA/CCA RATIO:  0.9 ECA:  93 cm/sec LEFT ICA:  98/21 cm/sec CCA:  270/78 cm/sec SYSTOLIC ICA/CCA RATIO:  0.9 DIASTOLIC ICA/CCA RATIO:  1.3 ECA:  121 cm/sec RIGHT CAROTID ARTERY: No significant atherosclerotic plaque or evidence of stenosis. RIGHT VERTEBRAL ARTERY:  Patent with normal antegrade  flow. LEFT CAROTID ARTERY: Trace atherosclerotic plaque. By peak systolic velocity criteria the estimated stenosis remains less than 50%. The distal internal carotid artery is highly tortuous. LEFT VERTEBRAL ARTERY:  Patent with antegrade flow. IMPRESSION: 1. Mild (1-49%) stenosis proximal left internal carotid artery secondary to heterogenous atherosclerotic plaque. 2. No significant atherosclerotic plaque or evidence of stenosis on the right. 3. Vertebral arteries are patent with normal antegrade flow. Electronically Signed   By: Jacqulynn Cadet M.D.   On: 05/16/2016 14:09   Mr Jodene Nam Head/brain ML Cm  Result Date: 05/16/2016 CLINICAL DATA:  Acute admission with left-sided weakness. EXAM: MRI HEAD WITHOUT CONTRAST MRA HEAD WITHOUT CONTRAST TECHNIQUE: Multiplanar, multiecho pulse sequences of the brain and surrounding structures were obtained without intravenous contrast. Angiographic images of the head were obtained using MRA technique without contrast. COMPARISON:  CT 05/15/2016 FINDINGS: MRI HEAD FINDINGS Brain: Diffusion imaging does not show any acute or subacute infarction. Mild chronic small-vessel ischemic changes affect the pons. No focal cerebellar insult. Cerebral hemispheres show mild chronic small-vessel ischemic change of the deep and subcortical white matter. No cortical or large vessel territory infarction. No mass lesion, hemorrhage, hydrocephalus or extra-axial collection. Vascular: Major vessels at the base of the brain show flow. Skull and upper cervical spine: Negative Sinuses/Orbits: Clear/ normal Other: None significant MRA HEAD FINDINGS Both internal carotid arteries are widely patent through the siphon regions. The anterior and middle cerebral vessels are normal without proximal stenosis, aneurysm or vascular malformation. The right vertebral artery is a large vessel widely patent to the basilar. There is retrograde flow in the small distal left vertebral artery supplying PICA/ AI cup.  No antegrade flow in the left vertebral artery. This could be due to developmental anatomy or there could be chronic left vertebral occlusion. No basilar stenosis. Posterior circulation branch vessels are intact. Patent posterior communicating arteries bilaterally. IMPRESSION: No acute brain finding. Mild chronic small-vessel ischemic changes affecting the pons and cerebral hemispheric white matter. No significant anterior circulation finding. Large right vertebral artery supplies the basilar. Apparent retrograde flow in the distal left vertebral artery. This could be due to more proximal left vertebral artery occlusion or could be a congenital variation. Electronically  Signed   By: Nelson Chimes M.D.   On: 05/16/2016 10:41    Medications:  Prior to Admission:  Prescriptions Prior to Admission  Medication Sig Dispense Refill Last Dose  . brimonidine (ALPHAGAN) 0.2 % ophthalmic solution Place 1 drop into both eyes daily.   05/15/2016 at Unknown time  . pantoprazole (PROTONIX) 20 MG tablet Take 1 tablet (20 mg total) by mouth daily. 30 tablet 0 05/15/2016 at Unknown time  . timolol (TIMOPTIC) 0.5 % ophthalmic solution Place 1 drop into both eyes daily.   05/15/2016 at Unknown time  . [DISCONTINUED] levothyroxine (SYNTHROID, LEVOTHROID) 88 MCG tablet Take 88 mcg by mouth daily before breakfast.   05/15/2016 at Unknown time   Scheduled: .  stroke: mapping our early stages of recovery book   Does not apply Once  . aspirin  300 mg Rectal Daily   Or  . aspirin  325 mg Oral Daily  . brimonidine  1 drop Both Eyes Daily  . enoxaparin (LOVENOX) injection  40 mg Subcutaneous Q24H  . levothyroxine  88 mcg Oral QAC breakfast  . pantoprazole  40 mg Oral Daily  . timolol  1 drop Both Eyes Daily   Continuous: . sodium chloride 50 mL/hr at 05/16/16 1630   TOI:[id number] **OR** acetaminophen (TYLENOL) oral liquid 160 mg/5 mL **OR** acetaminophen, senna-docusate  Assesment: She was admitted with a TIA. She has  chronic low back pain which is unchanged. She has hypothyroidism in her thyroid is somewhat under replaced. Her lipids were elevated. Active Problems:   TIA (transient ischemic attack)   Low back pain   Hypothyroidism    Plan: Discharge home today after echocardiogram done.    LOS: 2 days   Pedrohenrique Mcconville L 05/17/2016, 8:22 AM

## 2016-05-17 NOTE — Progress Notes (Signed)
*  PRELIMINARY RESULTS* Echocardiogram 2D Echocardiogram has been performed.  Jeryl Columbialliott, Yuri Fana 05/17/2016, 10:42 AM

## 2016-05-17 NOTE — Evaluation (Signed)
Physical Therapy Evaluation Patient Details Name: Lori DykesDelane P Colan MRN: [id number] DOB: [birth year] Today's Date: 05/17/2016   History of Present Illness  83 y.o. female, with hx hypothyroidism, chronic low back pain, presented to the ER with Ictus 3-4 hours PTA, left sided weakness lasting close to 30 minutes, with no visual changes, HA, facial droop, confusion or slurred speech.  She was not a candidate for TPA, as her symptoms resolved.  She has not been on ASA, and only take thyroid supplements, eye drops, and a PPI.  Work up in the ER included an EKG, showing NSR, a negative head CT, and unremarkable serology.  Clinical Impression  Pt received in bed, son and dtr in law present, and pt is agreeable to PT evaluation.  Pt is normally independent with unlimited community ambulation, and her sister drives her to assist with running errands.  She demonstrates independence with all functional mobility tasks.  She ambulated >[id number] with no assistive device, somewhat antalgic, but overall WFL with normal gait speed.  She did express that she has been having tingling in her L shoulder/neck for the past few weeks.  She also expressed that she was not having any pain, but her back was bothering her.  She related it to a fall she had 4 years ago where she broke her back.  Her son, however states that she has done something that has exacerbated it within the past few weeks.  Pt expressed that her mobility is back to baseline at this point.  She does not demonstrate need for any f/u PT services at this time.      Follow Up Recommendations No PT follow up    Equipment Recommendations  None recommended by PT    Recommendations for Other Services       Precautions / Restrictions Precautions Precautions: None Restrictions Weight Bearing Restrictions: No      Mobility  Bed Mobility Overal bed mobility: Independent                Transfers Overall transfer level: Independent                   Ambulation/Gait Ambulation/Gait assistance: Independent Ambulation Distance (Feet): 500 Feet Assistive device: None Gait Pattern/deviations: WFL(Within Functional Limits);Antalgic   Gait velocity interpretation: at or above normal speed for age/gender    Stairs            Wheelchair Mobility    Modified Rankin (Stroke Patients Only)       Balance Overall balance assessment: Independent                                           Pertinent Vitals/Pain Pain Assessment: No/denies pain    Home Living   Living Arrangements: Alone   Type of Home: House Home Access: Stairs to enter   Entergy CorporationEntrance Stairs-Number of Steps: 1 Home Layout: One level Home Equipment: None      Prior Function Level of Independence: Independent         Comments: Pt is independent with unlimited community distances.  She does not drive, but her sister assists with running errands.      Hand Dominance   Dominant Hand: Right    Extremity/Trunk Assessment   Upper Extremity Assessment Upper Extremity Assessment: Overall WFL for tasks assessed    Lower Extremity Assessment Lower Extremity Assessment: Overall WFL for tasks  assessed       Communication   Communication: HOH  Cognition Arousal/Alertness: Awake/alert Behavior During Therapy: WFL for tasks assessed/performed Overall Cognitive Status: Within Functional Limits for tasks assessed                      General Comments      Exercises     Assessment/Plan    PT Assessment Patent does not need any further PT services  PT Problem List         PT Treatment Interventions      PT Goals (Current goals can be found in the Care Plan section)  Acute Rehab PT Goals Patient Stated Goal: Pt wants to go home. PT Goal Formulation: All assessment and education complete, DC therapy    Frequency     Barriers to discharge        Co-evaluation               End of Session Equipment  Utilized During Treatment: Gait belt Activity Tolerance: Patient tolerated treatment well Patient left: in bed Nurse Communication: Mobility status Verlon Au, RN notified of pt's mobility status. ) PT Visit Diagnosis: Muscle weakness (generalized) (M62.81)    Functional Assessment Tool Used: AM-PAC 6 Clicks Basic Mobility;Clinical judgement Functional Limitation: Mobility: Walking and moving around Mobility: Walking and Moving Around Current Status [phone]): 0 percent impaired, limited or restricted Mobility: Walking and Moving Around Goal Status [phone]): 0 percent impaired, limited or restricted Mobility: Walking and Moving Around Discharge Status [phone]): 0 percent impaired, limited or restricted    Time: 0900-0915 PT Time Calculation (min) (ACUTE ONLY): 15 min   Charges:   PT Evaluation $PT Eval Low Complexity: 1 Procedure     PT G Codes:   PT G-Codes **NOT FOR INPATIENT CLASS** Functional Assessment Tool Used: AM-PAC 6 Clicks Basic Mobility;Clinical judgement Functional Limitation: Mobility: Walking and moving around Mobility: Walking and Moving Around Current Status ([id number]): 0 percent impaired, limited or restricted Mobility: Walking and Moving Around Goal Status (G9562): 0 percent impaired, limited or restricted Mobility: Walking and Moving Around Discharge Status (Z3086): 0 percent impaired, limited or restricted     Beth Aribella Vavra, PT, DPT X: [phone]

## 2016-05-17 NOTE — Consult Note (Signed)
Blair A. Merlene Laughter, MD     [email or web address]          Lori George is an 83 y.o. female.   ASSESSMENT/PLAN: 1. Transient ischemic attack most likely involving a small vessel. Risk factors include age and  hyperglycemia. Secondary prevention with aspirin is suggested. A full dose aspirin or 2 at 81 mg aspirin is recommended. Echocardiography is also suggested. 2. 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  The patient 83 year old white female who presents with the acute onset of left hemiplegia and numbness lasting for about 40 minutes. The patient does not report having headaches, dizziness or dysarthria. Given the profound weakness, she had difficulties ambulating and did bruise her left lower extremity. No evidence of chest pain, shortness of breath, GI GU symptoms. The review of systems otherwise negative. She tells me that she hasn't not been on the aspirin regimen before this event happened. She is currently back to baseline. TPA was not administered because the patient returned to baseline.   GENERAL: This very pleasant thin female in no acute distress.  HEENT: Supple. Atraumatic normocephalic.   ABDOMEN: soft  EXTREMITIES: No edema   BACK: Normal.  SKIN: Normal by inspection.    MENTAL STATUS: Alert and oriented. Speech, language and cognition are generally intact. Judgment and insight normal.   CRANIAL NERVES: Pupils are 7 mm on the right and nonreactive, equal, the left pupil is 4 mm and reactive; extra ocular movements are full, there is no significant nystagmus; visual fields are full; upper and lower facial muscles are normal in strength and symmetric, there is no flattening of the nasolabial folds; tongue is midline; uvula is midline; shoulder elevation is normal. Visual fields are intact.  MOTOR: Normal tone, bulk and strength; no pronator drift.  COORDINATION: Left finger to  nose is normal, right finger to nose is normal, No rest tremor; no intention tremor; no postural tremor; no bradykinesia.  REFLEXES: Deep tendon reflexes are symmetrical and normal. Babinski reflexes are flexor bilaterally.   SENSATION: Normal to light touch. There is no extinction to double simultaneous or tactile stimulation.  NIH stroke scale 0.      Blood pressure 118/65, pulse 68, temperature 98.4 F (36.9 C), temperature source Oral, resp. rate 16, height 5' 4"  (1.626 m), weight 125 lb (56.7 kg), SpO2 95 %.  Past Medical History:  Diagnosis Date  . Thyroid disease     Past Surgical History:  Procedure Laterality Date  . BACK SURGERY      History reviewed. No pertinent family history.  Social History:  reports that she has never smoked. She has never used smokeless tobacco. She reports that she does not drink alcohol or use drugs.  Allergies: No Known Allergies  Medications: Prior to Admission medications   Medication Sig Start Date End Date Taking? Authorizing Provider  brimonidine (ALPHAGAN) 0.2 % ophthalmic solution Place 1 drop into both eyes daily. 05/30/14  Yes Historical Provider, MD  pantoprazole (PROTONIX) 20 MG tablet Take 1 tablet (20 mg total) by mouth daily. 07/17/14  Yes Milton Ferguson, MD  timolol (TIMOPTIC) 0.5 % ophthalmic solution Place 1 drop into both eyes daily. 04/18/14  Yes Historical Provider, MD  aspirin 325 MG tablet Take 1 tablet (325 mg total) by mouth daily. 05/17/16   Sinda Du, MD  atorvastatin (LIPITOR) 20 MG tablet Take 1 tablet (20 mg total) by mouth daily. 05/17/16   Sinda Du, MD  levothyroxine (SYNTHROID, LEVOTHROID) 100 MCG tablet Take 1 tablet (100 mcg total) by mouth daily before breakfast. 05/17/16   Sinda Du, MD    Scheduled Meds: .  stroke: mapping our early stages of recovery book   Does not apply Once  . aspirin  300 mg Rectal Daily   Or  . aspirin  325 mg Oral Daily  . brimonidine  1 drop Both Eyes Daily  .  enoxaparin (LOVENOX) injection  40 mg Subcutaneous Q24H  . levothyroxine  88 mcg Oral QAC breakfast  . pantoprazole  40 mg Oral Daily  . timolol  1 drop Both Eyes Daily   Continuous Infusions: . sodium chloride 50 mL/hr at 05/16/16 1630   PRN Meds:.acetaminophen **OR** acetaminophen (TYLENOL) oral liquid 160 mg/5 mL **OR** acetaminophen, senna-docusate     Results for orders placed or performed during the hospital encounter of 05/15/16 (from the past 48 hour(s))  Comprehensive metabolic panel     Status: Abnormal   Collection Time: 05/15/16  1:12 PM  Result Value Ref Range   Sodium 137 135 - 145 mmol/L   Potassium 4.3 3.5 - 5.1 mmol/L   Chloride 102 101 - 111 mmol/L   CO2 28 22 - 32 mmol/L   Glucose, Bld 138 (H) 65 - 99 mg/dL   BUN 24 (H) 6 - 20 mg/dL   Creatinine, Ser 0.77 0.44 - 1.00 mg/dL   Calcium 10.0 8.9 - 10.3 mg/dL   Total Protein 7.2 6.5 - 8.1 g/dL   Albumin 4.2 3.5 - 5.0 g/dL   AST 18 15 - 41 U/L   ALT 13 (L) 14 - 54 U/L   Alkaline Phosphatase 110 38 - 126 U/L   Total Bilirubin 0.6 0.3 - 1.2 mg/dL   GFR calc non Af Amer >60 >60 mL/min   GFR calc Af Amer >60 >60 mL/min    Comment: (NOTE) The eGFR has been calculated using the CKD EPI equation. This calculation has not been validated in all clinical situations. eGFR's persistently <60 mL/min signify possible Chronic Kidney Disease.    Anion gap 7 5 - 15  CBC     Status: None   Collection Time: 05/15/16  1:12 PM  Result Value Ref Range   WBC 6.0 4.0 - 10.5 K/uL   RBC 4.37 3.87 - 5.11 MIL/uL   Hemoglobin 13.8 12.0 - 15.0 g/dL   HCT 40.8 36.0 - 46.0 %   MCV 93.4 78.0 - 100.0 fL   MCH 31.6 26.0 - 34.0 pg   MCHC 33.8 30.0 - 36.0 g/dL   RDW 12.5 11.5 - 15.5 %   Platelets 304 150 - 400 K/uL  TSH     Status: Abnormal   Collection Time: 05/15/16  1:12 PM  Result Value Ref Range   TSH 12.503 (H) 0.350 - 4.500 uIU/mL    Comment: Performed by a 3rd Generation assay with a functional sensitivity of <=0.01 uIU/mL.    Lipid panel     Status: Abnormal   Collection Time: 05/16/16  6:45 AM  Result Value Ref Range   Cholesterol 189 0 - 200 mg/dL   Triglycerides 57 <150 mg/dL   HDL 56 >40 mg/dL   Total CHOL/HDL Ratio 3.4 RATIO   VLDL 11 0 - 40 mg/dL   LDL Cholesterol 122 (H) 0 - 99 mg/dL    Comment:        Total Cholesterol/HDL:CHD Risk Coronary Heart Disease Risk Table  Men   Women  1/2 Average Risk   3.4   3.3  Average Risk       5.0   4.4  2 X Average Risk   9.6   7.1  3 X Average Risk  23.4   11.0        Use the calculated Patient Ratio above and the CHD Risk Table to determine the patient's CHD Risk.        ATP III CLASSIFICATION (LDL):  <100     mg/dL   Optimal  100-129  mg/dL   Near or Above                    Optimal  130-159  mg/dL   Borderline  160-189  mg/dL   High  >190     mg/dL   Very High     Studies/Results:  BRAIN MRI MRA FINDINGS: MRI HEAD FINDINGS  Brain: Diffusion imaging does not show any acute or subacute infarction. Mild chronic small-vessel ischemic changes affect the pons. No focal cerebellar insult. Cerebral hemispheres show mild chronic small-vessel ischemic change of the deep and subcortical white matter. No cortical or large vessel territory infarction. No mass lesion, hemorrhage, hydrocephalus or extra-axial collection.  Vascular: Major vessels at the base of the brain show flow.  Skull and upper cervical spine: Negative  Sinuses/Orbits: Clear/ normal  Other: None significant  MRA HEAD FINDINGS  Both internal carotid arteries are widely patent through the siphon regions. The anterior and middle cerebral vessels are normal without proximal stenosis, aneurysm or vascular malformation. The right vertebral artery is a large vessel widely patent to the basilar. There is retrograde flow in the small distal left vertebral artery supplying PICA/ AI cup. No antegrade flow in the left vertebral artery. This could be due to  developmental anatomy or there could be chronic left vertebral occlusion. No basilar stenosis. Posterior circulation branch vessels are intact. Patent posterior communicating arteries bilaterally.  IMPRESSION: No acute brain finding. Mild chronic small-vessel ischemic changes affecting the pons and cerebral hemispheric white matter.  No significant anterior circulation finding.  Large right vertebral artery supplies the basilar. Apparent retrograde flow in the distal left vertebral artery. This could be due to more proximal left vertebral artery occlusion or could be a congenital variation.       CAROTID DOPPLERS FINDINGS: Criteria: Quantification of carotid stenosis is based on velocity parameters that correlate the residual internal carotid diameter with NASCET-based stenosis levels, using the diameter of the distal internal carotid lumen as the denominator for stenosis measurement.  The following velocity measurements were obtained:  RIGHT  ICA:  91/14 cm/sec  CCA:  56/38 cm/sec  SYSTOLIC ICA/CCA RATIO:  0.9  DIASTOLIC ICA/CCA RATIO:  0.9  ECA:  93 cm/sec  LEFT  ICA:  98/21 cm/sec  CCA:  937/34 cm/sec  SYSTOLIC ICA/CCA RATIO:  0.9  DIASTOLIC ICA/CCA RATIO:  1.3  ECA:  121 cm/sec  RIGHT CAROTID ARTERY: No significant atherosclerotic plaque or evidence of stenosis.  RIGHT VERTEBRAL ARTERY:  Patent with normal antegrade flow.  LEFT CAROTID ARTERY: Trace atherosclerotic plaque. By peak systolic velocity criteria the estimated stenosis remains less than 50%. The distal internal carotid artery is highly tortuous.  LEFT VERTEBRAL ARTERY:  Patent with antegrade flow.  IMPRESSION: 1. Mild (1-49%) stenosis proximal left internal carotid artery secondary to heterogenous atherosclerotic plaque. 2. No significant atherosclerotic plaque or evidence of stenosis on the right. 3. Vertebral arteries are patent with normal  antegrade  flow.              Marlaya Turck A. Merlene Laughter, M.D.  Diplomate, Tax adviser of Psychiatry and Neurology ( Neurology). 05/17/2016, 8:22 AM

## 2016-05-17 NOTE — Care Management Important Message (Signed)
Important Message  Patient Details  Name: Lori George MRN: [id number] Date of Birth: 1933/02/21   Medicare Important Message Given:  Yes    Malcolm MetroChildress, Zenia Guest Demske, RN 05/17/2016, 11:21 AM

## 2016-05-17 NOTE — Progress Notes (Signed)
SLP Cancellation Note  Patient Details Name: Lori DykesDelane P Common MRN: [id number] DOB: 09/11/1932   Cancelled treatment:       Reason Eval/Treat Not Completed: SLP screened, no needs identified, will sign off; SLP screened Pt in room. Pt denies any changes in swallowing, speech, language, or cognition. MRI negative for acute changes. SLE will be deferred at this time. Reconsult if indicated. SLP will sign off.  Thank you,  Havery MorosDabney Porter, CCC-SLP [phone]    PORTER,DABNEY 05/17/2016, 11:22 AM

## 2016-05-17 NOTE — Care Management Note (Signed)
Case Management Note  Patient Details  Name: Lori George MRN: [id number] Date of Birth: 12-05-1932  Subjective/Objective:                  Pt admitted with TIA, Pt from home lives alone, has pcp, transportation and no difficulty affording or managing medications. No HH or DME needs.  Action/Plan: Discharging home with self care today.   Expected Discharge Date:  05/17/16               Expected Discharge Plan:  Home/Self Care  In-House Referral:  NA  Discharge planning Services  CM Consult  Post Acute Care Choice:  NA Choice offered to:  NA  Status of Service:  Completed, signed off  Malcolm MetroChildress, Lakesha Levinson Demske, RN 05/17/2016, 11:21 AM

## 2016-05-17 NOTE — Discharge Summary (Signed)
Physician Discharge Summary  Patient ID: Lori George MRN: [id number] DOB/AGE: [birth year] 83 y.o. Primary Care Physician:Huel Centola L, MD Admit date: 05/15/2016 Discharge date: 05/17/2016    Discharge Diagnoses:   Active Problems:   TIA (transient ischemic attack)   Low back pain   Hypothyroidism   Hyperlipidemia   GERD (gastroesophageal reflux disease)   Allergies as of 05/17/2016   No Known Allergies     Medication List    TAKE these medications   aspirin 325 MG tablet Take 1 tablet (325 mg total) by mouth daily.   atorvastatin 20 MG tablet Commonly known as:  LIPITOR Take 1 tablet (20 mg total) by mouth daily.   brimonidine 0.2 % ophthalmic solution Commonly known as:  ALPHAGAN Place 1 drop into both eyes daily.   levothyroxine 100 MCG tablet Commonly known as:  SYNTHROID, LEVOTHROID Take 1 tablet (100 mcg total) by mouth daily before breakfast. What changed:  medication strength  how much to take   pantoprazole 20 MG tablet Commonly known as:  PROTONIX Take 1 tablet (20 mg total) by mouth daily.   timolol 0.5 % ophthalmic solution Commonly known as:  TIMOPTIC Place 1 drop into both eyes daily.       Discharged Condition:Improved    Consults: None  Significant Diagnostic Studies: Ct Head Wo Contrast  Result Date: 05/15/2016 CLINICAL DATA:  Sudden onset left arm and leg weakness at 11 a.m. today. The episode lasted approximately 30 minutes. EXAM: CT HEAD WITHOUT CONTRAST TECHNIQUE: Contiguous axial images were obtained from the base of the skull through the vertex without intravenous contrast. COMPARISON:  None. FINDINGS: Brain: Diffusely enlarged ventricles and subarachnoid spaces. Patchy white matter low density in both cerebral hemispheres. No intracranial hemorrhage, mass lesion or CT evidence of acute infarction. Vascular: No hyperdense vessel or unexpected calcification. Skull: Normal. Negative for fracture or focal lesion. Sinuses/Orbits:  Small amount of right sphenoid sinus mucosal thickening. Unremarkable orbits. Other: None. IMPRESSION: No acute abnormality. Minimal atrophy and minimal chronic small vessel white matter ischemic changes in both cerebral hemispheres. Electronically Signed   By: Beckie Salts M.D.   On: 05/15/2016 13:42   Mr Brain Wo Contrast  Result Date: 05/16/2016 CLINICAL DATA:  Acute admission with left-sided weakness. EXAM: MRI HEAD WITHOUT CONTRAST MRA HEAD WITHOUT CONTRAST TECHNIQUE: Multiplanar, multiecho pulse sequences of the brain and surrounding structures were obtained without intravenous contrast. Angiographic images of the head were obtained using MRA technique without contrast. COMPARISON:  CT 05/15/2016 FINDINGS: MRI HEAD FINDINGS Brain: Diffusion imaging does not show any acute or subacute infarction. Mild chronic small-vessel ischemic changes affect the pons. No focal cerebellar insult. Cerebral hemispheres show mild chronic small-vessel ischemic change of the deep and subcortical white matter. No cortical or large vessel territory infarction. No mass lesion, hemorrhage, hydrocephalus or extra-axial collection. Vascular: Major vessels at the base of the brain show flow. Skull and upper cervical spine: Negative Sinuses/Orbits: Clear/ normal Other: None significant MRA HEAD FINDINGS Both internal carotid arteries are widely patent through the siphon regions. The anterior and middle cerebral vessels are normal without proximal stenosis, aneurysm or vascular malformation. The right vertebral artery is a large vessel widely patent to the basilar. There is retrograde flow in the small distal left vertebral artery supplying PICA/ AI cup. No antegrade flow in the left vertebral artery. This could be due to developmental anatomy or there could be chronic left vertebral occlusion. No basilar stenosis. Posterior circulation branch vessels are intact. Patent posterior communicating arteries  bilaterally. IMPRESSION: No  acute brain finding. Mild chronic small-vessel ischemic changes affecting the pons and cerebral hemispheric white matter. No significant anterior circulation finding. Large right vertebral artery supplies the basilar. Apparent retrograde flow in the distal left vertebral artery. This could be due to more proximal left vertebral artery occlusion or could be a congenital variation. Electronically Signed   By: Paulina FusiMark  Shogry M.D.   On: 05/16/2016 10:41   [place] Carotid Bilateral (at [facility] And Ap Only)  Result Date: 05/16/2016 CLINICAL DATA:  [age] year old female with stroke-like symptoms EXAM: BILATERAL CAROTID DUPLEX ULTRASOUND TECHNIQUE: Wallace CullensGray scale imaging, color Doppler and duplex ultrasound were performed of bilateral carotid and vertebral arteries in the neck. COMPARISON:  Brain MRI obtained concurrently FINDINGS: Criteria: Quantification of carotid stenosis is based on velocity parameters that correlate the residual internal carotid diameter with NASCET-based stenosis levels, using the diameter of the distal internal carotid lumen as the denominator for stenosis measurement. The following velocity measurements were obtained: RIGHT ICA:  91/14 cm/sec CCA:  99/15 cm/sec SYSTOLIC ICA/CCA RATIO:  0.9 DIASTOLIC ICA/CCA RATIO:  0.9 ECA:  93 cm/sec LEFT ICA:  98/21 cm/sec CCA:  114/17 cm/sec SYSTOLIC ICA/CCA RATIO:  0.9 DIASTOLIC ICA/CCA RATIO:  1.3 ECA:  121 cm/sec RIGHT CAROTID ARTERY: No significant atherosclerotic plaque or evidence of stenosis. RIGHT VERTEBRAL ARTERY:  Patent with normal antegrade flow. LEFT CAROTID ARTERY: Trace atherosclerotic plaque. By peak systolic velocity criteria the estimated stenosis remains less than 50%. The distal internal carotid artery is highly tortuous. LEFT VERTEBRAL ARTERY:  Patent with antegrade flow. IMPRESSION: 1. Mild (1-49%) stenosis proximal left internal carotid artery secondary to heterogenous atherosclerotic plaque. 2. No significant atherosclerotic plaque or evidence of  stenosis on the right. 3. Vertebral arteries are patent with normal antegrade flow. Electronically Signed   By: Malachy MoanHeath  McCullough M.D.   On: 05/16/2016 14:09   Mr Maxine GlennMra Head/brain UEWo Cm  Result Date: 05/16/2016 CLINICAL DATA:  Acute admission with left-sided weakness. EXAM: MRI HEAD WITHOUT CONTRAST MRA HEAD WITHOUT CONTRAST TECHNIQUE: Multiplanar, multiecho pulse sequences of the brain and surrounding structures were obtained without intravenous contrast. Angiographic images of the head were obtained using MRA technique without contrast. COMPARISON:  CT 05/15/2016 FINDINGS: MRI HEAD FINDINGS Brain: Diffusion imaging does not show any acute or subacute infarction. Mild chronic small-vessel ischemic changes affect the pons. No focal cerebellar insult. Cerebral hemispheres show mild chronic small-vessel ischemic change of the deep and subcortical white matter. No cortical or large vessel territory infarction. No mass lesion, hemorrhage, hydrocephalus or extra-axial collection. Vascular: Major vessels at the base of the brain show flow. Skull and upper cervical spine: Negative Sinuses/Orbits: Clear/ normal Other: None significant MRA HEAD FINDINGS Both internal carotid arteries are widely patent through the siphon regions. The anterior and middle cerebral vessels are normal without proximal stenosis, aneurysm or vascular malformation. The right vertebral artery is a large vessel widely patent to the basilar. There is retrograde flow in the small distal left vertebral artery supplying PICA/ AI cup. No antegrade flow in the left vertebral artery. This could be due to developmental anatomy or there could be chronic left vertebral occlusion. No basilar stenosis. Posterior circulation branch vessels are intact. Patent posterior communicating arteries bilaterally. IMPRESSION: No acute brain finding. Mild chronic small-vessel ischemic changes affecting the pons and cerebral hemispheric white matter. No significant anterior  circulation finding. Large right vertebral artery supplies the basilar. Apparent retrograde flow in the distal left vertebral artery. This could be due to more proximal  left vertebral artery occlusion or could be a congenital variation. Electronically Signed   By: Paulina Fusi M.D.   On: 05/16/2016 10:41    Lab Results: Basic Metabolic Panel:  Recent Labs  16/10/96 1312  NA 137  K 4.3  CL 102  CO2 28  GLUCOSE 138*  BUN 24*  CREATININE 0.77  CALCIUM 10.0   Liver Function Tests:  Recent Labs  05/15/16 1312  AST 18  ALT 13*  ALKPHOS 110  BILITOT 0.6  PROT 7.2  ALBUMIN 4.2     CBC:  Recent Labs  05/15/16 1312  WBC 6.0  HGB 13.8  HCT 40.8  MCV 93.4  PLT 304    No results found for this or any previous visit (from the past 240 hour(s)).   Hospital Course: This is an 83 year old who came to the emergency department with left-sided weakness that lasted about 30 minutes. She had weakness for arm and her leg and was unable to walk. She did not have slurred speech. She did not have any change in her face. She came to the emergency department was found to have negative head CT bedside swallow was normal she was given aspirin and brought in for TIA workup. Her lipids were elevated. Her thyroid was somewhat under replaced. She underwent echocardiogram which is pending MRI and MRA of the brain which did not show acute stroke carotid ultrasound which showed mild changes. She is able to ambulate without any difficulty and was ready for discharge.  Discharge Exam: Blood pressure 118/65, pulse 68, temperature 98.4 F (36.9 C), temperature source Oral, resp. rate 16, height 5\' 4"  (1.626 m), weight 56.7 kg (125 lb), SpO2 95 %. She is awake and alert. Hard of hearing but otherwise neurologically intact.  Disposition: Home      Signed: Tae Robak L   05/17/2016, 8:26 AM

## 2016-05-18 ENCOUNTER — Encounter (HOSPITAL_COMMUNITY): Payer: Self-pay | Admitting: *Deleted

## 2016-05-18 ENCOUNTER — Emergency Department (HOSPITAL_COMMUNITY)
Admission: EM | Admit: 2016-05-18 | Discharge: 2016-05-18 | Disposition: A | Discharge status: Home / Self Care | Payer: Medicare Other | Attending: Emergency Medicine | Admitting: Emergency Medicine

## 2016-05-18 DIAGNOSIS — R0789 Other chest pain: Secondary | ICD-10-CM | POA: Diagnosis not present

## 2016-05-18 DIAGNOSIS — Z7982 Long term (current) use of aspirin: Secondary | ICD-10-CM | POA: Diagnosis not present

## 2016-05-18 DIAGNOSIS — E039 Hypothyroidism, unspecified: Secondary | ICD-10-CM | POA: Diagnosis not present

## 2016-05-18 DIAGNOSIS — Z79899 Other long term (current) drug therapy: Secondary | ICD-10-CM | POA: Diagnosis not present

## 2016-05-18 DIAGNOSIS — R110 Nausea: Secondary | ICD-10-CM

## 2016-05-18 DIAGNOSIS — R197 Diarrhea, unspecified: Secondary | ICD-10-CM

## 2016-05-18 DIAGNOSIS — R404 Transient alteration of awareness: Secondary | ICD-10-CM | POA: Diagnosis not present

## 2016-05-18 DIAGNOSIS — R531 Weakness: Secondary | ICD-10-CM | POA: Diagnosis not present

## 2016-05-18 LAB — CBC WITH DIFFERENTIAL/PLATELET
BASOS ABS: 0 10*3/uL (ref 0.0–0.1)
BASOS PCT: 1 %
EOS ABS: 0.1 10*3/uL (ref 0.0–0.7)
Eosinophils Relative: 1 %
HCT: 37.8 % (ref 36.0–46.0)
Hemoglobin: 13.2 g/dL (ref 12.0–15.0)
Lymphocytes Relative: 22 %
Lymphs Abs: 1.4 10*3/uL (ref 0.7–4.0)
MCH: 32.1 pg (ref 26.0–34.0)
MCHC: 34.9 g/dL (ref 30.0–36.0)
MCV: 92 fL (ref 78.0–100.0)
MONOS PCT: 7 %
Monocytes Absolute: 0.4 10*3/uL (ref 0.1–1.0)
NEUTROS ABS: 4.5 10*3/uL (ref 1.7–7.7)
NEUTROS PCT: 69 %
PLATELETS: 249 10*3/uL (ref 150–400)
RBC: 4.11 MIL/uL (ref 3.87–5.11)
RDW: 12.7 % (ref 11.5–15.5)
WBC: 6.5 10*3/uL (ref 4.0–10.5)

## 2016-05-18 LAB — COMPREHENSIVE METABOLIC PANEL
ALT: 14 U/L (ref 14–54)
AST: 18 U/L (ref 15–41)
Albumin: 3.6 g/dL (ref 3.5–5.0)
Alkaline Phosphatase: 99 U/L (ref 38–126)
Anion gap: 8 (ref 5–15)
BUN: 23 mg/dL — ABNORMAL HIGH (ref 6–20)
CHLORIDE: 108 mmol/L (ref 101–111)
CO2: 24 mmol/L (ref 22–32)
CREATININE: 0.83 mg/dL (ref 0.44–1.00)
Calcium: 9.4 mg/dL (ref 8.9–10.3)
Glucose, Bld: 125 mg/dL — ABNORMAL HIGH (ref 65–99)
POTASSIUM: 3.5 mmol/L (ref 3.5–5.1)
SODIUM: 140 mmol/L (ref 135–145)
Total Bilirubin: 0.6 mg/dL (ref 0.3–1.2)
Total Protein: 6.5 g/dL (ref 6.5–8.1)

## 2016-05-18 LAB — URINALYSIS, ROUTINE W REFLEX MICROSCOPIC
BACTERIA UA: NONE SEEN
BILIRUBIN URINE: NEGATIVE
Glucose, UA: NEGATIVE mg/dL
KETONES UR: NEGATIVE mg/dL
LEUKOCYTES UA: NEGATIVE
NITRITE: NEGATIVE
PROTEIN: NEGATIVE mg/dL
SPECIFIC GRAVITY, URINE: 1.012 (ref 1.005–1.030)
pH: 5 (ref 5.0–8.0)

## 2016-05-18 LAB — I-STAT TROPONIN, ED: TROPONIN I, POC: 0 ng/mL (ref 0.00–0.08)

## 2016-05-18 MED ORDER — ONDANSETRON HCL 4 MG/2ML IJ SOLN
4.0000 mg | Freq: Once | INTRAMUSCULAR | Status: AC
  Administered 2016-05-18: 4 mg via INTRAVENOUS
  Filled 2016-05-18: qty 2

## 2016-05-18 MED ORDER — ONDANSETRON HCL 4 MG PO TABS: 4.0000 mg | ORAL_TABLET | Freq: Four times a day (QID) | ORAL | 0 refills | Status: DC

## 2016-05-18 NOTE — ED Triage Notes (Signed)
Pt brought in by [facility] for c/o sick call; pt states "i'm sick" and does not elaborate on how she is sick; pt is c/o back pain; pt was just discharged from [facility] for possible stroke

## 2016-05-18 NOTE — ED Provider Notes (Signed)
[facility]-EMERGENCY DEPT Provider Note   CSN: [id number] Arrival date & time: 05/18/16  0247     History   Chief Complaint Chief Complaint  Patient presents with  . Back Pain    HPI Lori George is a 83 y.o. female.  Patient reports that she called an ambulance tonight because she was "deathly sick". Patient reports that she was experiencing nausea but did not vomit. She reports that the nausea has resolved at time of arrival. She had some tenderness of her chest wall earlier. She reports that when her son pressed on her chest that hurt, but is not experiencing any continuous chest pain. She is not having any shortness of breath. She did not vomit but does report that she had diarrhea earlier. She is not expressing any abdominal pain currently.      Past Medical History:  Diagnosis Date  . Thyroid disease     Patient Active Problem List   Diagnosis Date Noted  . Hyperlipidemia 05/17/2016  . GERD (gastroesophageal reflux disease) 05/17/2016  . TIA (transient ischemic attack) 05/15/2016  . Low back pain 05/15/2016  . Hypothyroidism 05/15/2016    Past Surgical History:  Procedure Laterality Date  . BACK SURGERY      OB History    No data available       Home Medications    Prior to Admission medications   Medication Sig Start Date End Date Taking? Authorizing Provider  aspirin 325 MG tablet Take 1 tablet (325 mg total) by mouth daily. 05/17/16   Kari Baars, MD  atorvastatin (LIPITOR) 20 MG tablet Take 1 tablet (20 mg total) by mouth daily. 05/17/16   Kari Baars, MD  brimonidine (ALPHAGAN) 0.2 % ophthalmic solution Place 1 drop into both eyes daily. 05/30/14   Historical Provider, MD  levothyroxine (SYNTHROID, LEVOTHROID) 100 MCG tablet Take 1 tablet (100 mcg total) by mouth daily before breakfast. 05/17/16   Kari Baars, MD  ondansetron (ZOFRAN) 4 MG tablet Take 1 tablet (4 mg total) by mouth every 6 (six) hours. 05/18/16   Gilda Crease, MD    pantoprazole (PROTONIX) 20 MG tablet Take 1 tablet (20 mg total) by mouth daily. 07/17/14   Bethann Berkshire, MD  timolol (TIMOPTIC) 0.5 % ophthalmic solution Place 1 drop into both eyes daily. 04/18/14   Historical Provider, MD    Family History History reviewed. No pertinent family history.  Social History Social History  Substance Use Topics  . Smoking status: Never Smoker  . Smokeless tobacco: Never Used  . Alcohol use No     Allergies   Patient has no known allergies.   Review of Systems Review of Systems  Gastrointestinal: Positive for diarrhea and nausea.  All other systems reviewed and are negative.    Physical Exam Updated Vital Signs BP 122/66   Pulse 68   Temp 98.5 F (36.9 C) (Oral)   Resp (!) 22   Ht 5\' 4"  (1.626 m)   Wt 125 lb (56.7 kg)   SpO2 96%   BMI 21.46 kg/m   Physical Exam  Constitutional: She is oriented to person, place, and time. She appears well-developed and well-nourished. No distress.  HENT:  Head: Normocephalic and atraumatic.  Right Ear: Hearing normal.  Left Ear: Hearing normal.  Nose: Nose normal.  Mouth/Throat: Oropharynx is clear and moist and mucous membranes are normal.  Eyes: Conjunctivae and EOM are normal. Pupils are equal, round, and reactive to light.  Neck: Normal range of motion. Neck supple.  Cardiovascular: Regular rhythm, S1 normal and S2 normal.  Exam reveals no gallop and no friction rub.   No murmur heard. Pulmonary/Chest: Effort normal and breath sounds normal. No respiratory distress. She exhibits tenderness.    Abdominal: Soft. Normal appearance and bowel sounds are normal. There is no hepatosplenomegaly. There is no tenderness. There is no rebound, no guarding, no tenderness at McBurney's point and negative Murphy's sign. No hernia.  Musculoskeletal: Normal range of motion.  Neurological: She is alert and oriented to person, place, and time. She has normal strength. No cranial nerve deficit or sensory deficit.  Coordination normal. GCS eye subscore is 4. GCS verbal subscore is 5. GCS motor subscore is 6.  Skin: Skin is warm, dry and intact. No rash noted. No cyanosis.  Psychiatric: She has a normal mood and affect. Her speech is normal and behavior is normal. Thought content normal.  Nursing note and vitals reviewed.    ED Treatments / Results  Labs (all labs ordered are listed, but only abnormal results are displayed) Labs Reviewed  COMPREHENSIVE METABOLIC PANEL - Abnormal; Notable for the following:       Result Value   Glucose, Bld 125 (*)    BUN 23 (*)    All other components within normal limits  URINALYSIS, ROUTINE W REFLEX MICROSCOPIC - Abnormal; Notable for the following:    Hgb urine dipstick SMALL (*)    All other components within normal limits  CBC WITH DIFFERENTIAL/PLATELET  Rosezena Sensor, ED    EKG  EKG Interpretation  Date/Time:  Tuesday May 18 2016 02:56:36 EDT Ventricular Rate:  80 PR Interval:    QRS Duration: 90 QT Interval:  384 QTC Calculation: 443 R Axis:   73 Text Interpretation:  Sinus rhythm Baseline wander in lead(s) V1 V2 No significant change since last tracing Confirmed by Blinda Leatherwood  MD, Ronelle Michie [phone]) on 05/18/2016 3:09:27 AM       Radiology Mr Brain Wo Contrast  Result Date: 05/16/2016 CLINICAL DATA:  Acute admission with left-sided weakness. EXAM: MRI HEAD WITHOUT CONTRAST MRA HEAD WITHOUT CONTRAST TECHNIQUE: Multiplanar, multiecho pulse sequences of the brain and surrounding structures were obtained without intravenous contrast. Angiographic images of the head were obtained using MRA technique without contrast. COMPARISON:  CT 05/15/2016 FINDINGS: MRI HEAD FINDINGS Brain: Diffusion imaging does not show any acute or subacute infarction. Mild chronic small-vessel ischemic changes affect the pons. No focal cerebellar insult. Cerebral hemispheres show mild chronic small-vessel ischemic change of the deep and subcortical white matter. No cortical  or large vessel territory infarction. No mass lesion, hemorrhage, hydrocephalus or extra-axial collection. Vascular: Major vessels at the base of the brain show flow. Skull and upper cervical spine: Negative Sinuses/Orbits: Clear/ normal Other: None significant MRA HEAD FINDINGS Both internal carotid arteries are widely patent through the siphon regions. The anterior and middle cerebral vessels are normal without proximal stenosis, aneurysm or vascular malformation. The right vertebral artery is a large vessel widely patent to the basilar. There is retrograde flow in the small distal left vertebral artery supplying PICA/ AI cup. No antegrade flow in the left vertebral artery. This could be due to developmental anatomy or there could be chronic left vertebral occlusion. No basilar stenosis. Posterior circulation branch vessels are intact. Patent posterior communicating arteries bilaterally. IMPRESSION: No acute brain finding. Mild chronic small-vessel ischemic changes affecting the pons and cerebral hemispheric white matter. No significant anterior circulation finding. Large right vertebral artery supplies the basilar. Apparent retrograde flow in the distal left  vertebral artery. This could be due to more proximal left vertebral artery occlusion or could be a congenital variation. Electronically Signed   By: Paulina Fusi M.D.   On: 05/16/2016 10:41   US Carotid Bilateral (at [facility] Only)  Result Date: 05/16/2016 CLINICAL DATA:  83 year old female with stroke-like symptoms EXAM: BILATERAL CAROTID DUPLEX ULTRASOUND TECHNIQUE: Wallace Cullens scale imaging, color Doppler and duplex ultrasound were performed of bilateral carotid and vertebral arteries in the neck. COMPARISON:  Brain MRI obtained concurrently FINDINGS: Criteria: Quantification of carotid stenosis is based on velocity parameters that correlate the residual internal carotid diameter with NASCET-based stenosis levels, using the diameter of the distal internal  carotid lumen as the denominator for stenosis measurement. The following velocity measurements were obtained: RIGHT ICA:  91/14 cm/sec CCA:  99/15 cm/sec SYSTOLIC ICA/CCA RATIO:  0.9 DIASTOLIC ICA/CCA RATIO:  0.9 ECA:  93 cm/sec LEFT ICA:  98/21 cm/sec CCA:  114/17 cm/sec SYSTOLIC ICA/CCA RATIO:  0.9 DIASTOLIC ICA/CCA RATIO:  1.3 ECA:  121 cm/sec RIGHT CAROTID ARTERY: No significant atherosclerotic plaque or evidence of stenosis. RIGHT VERTEBRAL ARTERY:  Patent with normal antegrade flow. LEFT CAROTID ARTERY: Trace atherosclerotic plaque. By peak systolic velocity criteria the estimated stenosis remains less than 50%. The distal internal carotid artery is highly tortuous. LEFT VERTEBRAL ARTERY:  Patent with antegrade flow. IMPRESSION: 1. Mild (1-49%) stenosis proximal left internal carotid artery secondary to heterogenous atherosclerotic plaque. 2. No significant atherosclerotic plaque or evidence of stenosis on the right. 3. Vertebral arteries are patent with normal antegrade flow. Electronically Signed   By: Malachy Moan M.D.   On: 05/16/2016 14:09   Mr Maxine Glenn Head/brain GN Cm  Result Date: 05/16/2016 CLINICAL DATA:  Acute admission with left-sided weakness. EXAM: MRI HEAD WITHOUT CONTRAST MRA HEAD WITHOUT CONTRAST TECHNIQUE: Multiplanar, multiecho pulse sequences of the brain and surrounding structures were obtained without intravenous contrast. Angiographic images of the head were obtained using MRA technique without contrast. COMPARISON:  CT 05/15/2016 FINDINGS: MRI HEAD FINDINGS Brain: Diffusion imaging does not show any acute or subacute infarction. Mild chronic small-vessel ischemic changes affect the pons. No focal cerebellar insult. Cerebral hemispheres show mild chronic small-vessel ischemic change of the deep and subcortical white matter. No cortical or large vessel territory infarction. No mass lesion, hemorrhage, hydrocephalus or extra-axial collection. Vascular: Major vessels at the base of the  brain show flow. Skull and upper cervical spine: Negative Sinuses/Orbits: Clear/ normal Other: None significant MRA HEAD FINDINGS Both internal carotid arteries are widely patent through the siphon regions. The anterior and middle cerebral vessels are normal without proximal stenosis, aneurysm or vascular malformation. The right vertebral artery is a large vessel widely patent to the basilar. There is retrograde flow in the small distal left vertebral artery supplying PICA/ AI cup. No antegrade flow in the left vertebral artery. This could be due to developmental anatomy or there could be chronic left vertebral occlusion. No basilar stenosis. Posterior circulation branch vessels are intact. Patent posterior communicating arteries bilaterally. IMPRESSION: No acute brain finding. Mild chronic small-vessel ischemic changes affecting the pons and cerebral hemispheric white matter. No significant anterior circulation finding. Large right vertebral artery supplies the basilar. Apparent retrograde flow in the distal left vertebral artery. This could be due to more proximal left vertebral artery occlusion or could be a congenital variation. Electronically Signed   By: Paulina Fusi M.D.   On: 05/16/2016 10:41    Procedures Procedures (including critical care time)  Medications Ordered in ED Medications  ondansetron [facility](ZOFRAN) injection 4 mg (4 mg Intravenous Given 05/18/16 0305)     Initial Impression / Assessment and Plan / ED Course  I have reviewed the triage vital signs and the nursing notes.  Pertinent labs & imaging results that were available during my care of the patient were reviewed by me and considered in my medical decision making (see chart for details).     Patient presents to the emergency department for evaluation of multiple problems. Patient came in with complaints of generalized weakness. She was nauseated at home and had an episode of diarrhea. She became weak and couldn't get off the  toilet. Her son had to help her get up and she noticed that her chest was very tender when he pressed on it. She has been having some intermittent sharp pains in the chest that last only for seconds at a time, no continuous chest pain. EKG, troponin negative. Blood work is unremarkable. She is feeling much better after IV Zofran. No further diarrhea. As a workup is entirely normal, symptoms have resolved, it is safe for her to be discharged and follow-up with her primary doctor.  HEART score is 3 (TIA, hyperlipidemia, age)  Final Clinical Impressions(s) / ED Diagnoses   Final diagnoses:  Chest wall pain  Diarrhea, unspecified type  Nausea    New Prescriptions New Prescriptions   ONDANSETRON (ZOFRAN) 4 MG TABLET    Take 1 tablet (4 mg total) by mouth every 6 (six) hours.     Gilda Creasehristopher J Jahmier Willadsen, MD 05/18/16 [phone]

## 2016-05-18 NOTE — ED Notes (Signed)
Patient states that she is not able to void at this time, due to using restroom before leaving home. Orthostatic vitals done and EKG given to Dr Blinda LeatherwoodPollina

## 2016-05-26 DIAGNOSIS — M5416 Radiculopathy, lumbar region: Secondary | ICD-10-CM | POA: Diagnosis not present

## 2016-05-26 DIAGNOSIS — M545 Low back pain: Secondary | ICD-10-CM | POA: Diagnosis not present

## 2016-06-02 DIAGNOSIS — M545 Low back pain: Secondary | ICD-10-CM | POA: Diagnosis not present

## 2016-06-02 DIAGNOSIS — S32028A Other fracture of second lumbar vertebra, initial encounter for closed fracture: Secondary | ICD-10-CM | POA: Diagnosis not present

## 2016-06-02 DIAGNOSIS — Z780 Asymptomatic menopausal state: Secondary | ICD-10-CM | POA: Diagnosis not present

## 2016-06-07 DIAGNOSIS — M4856XG Collapsed vertebra, not elsewhere classified, lumbar region, subsequent encounter for fracture with delayed healing: Secondary | ICD-10-CM | POA: Diagnosis not present

## 2016-07-07 DIAGNOSIS — M4856XG Collapsed vertebra, not elsewhere classified, lumbar region, subsequent encounter for fracture with delayed healing: Secondary | ICD-10-CM | POA: Diagnosis not present

## 2016-07-07 DIAGNOSIS — M545 Low back pain: Secondary | ICD-10-CM | POA: Diagnosis not present

## 2017-03-28 DIAGNOSIS — E039 Hypothyroidism, unspecified: Secondary | ICD-10-CM | POA: Diagnosis not present

## 2017-03-31 DIAGNOSIS — Z8673 Personal history of transient ischemic attack (TIA), and cerebral infarction without residual deficits: Secondary | ICD-10-CM | POA: Diagnosis not present

## 2017-03-31 DIAGNOSIS — D649 Anemia, unspecified: Secondary | ICD-10-CM | POA: Diagnosis not present

## 2017-03-31 DIAGNOSIS — E039 Hypothyroidism, unspecified: Secondary | ICD-10-CM | POA: Diagnosis not present

## 2017-03-31 DIAGNOSIS — K921 Melena: Secondary | ICD-10-CM | POA: Diagnosis not present

## 2017-03-31 DIAGNOSIS — M545 Low back pain: Secondary | ICD-10-CM | POA: Diagnosis not present

## 2017-05-05 ENCOUNTER — Emergency Department (HOSPITAL_COMMUNITY): Payer: Medicare Other

## 2017-05-05 ENCOUNTER — Emergency Department (HOSPITAL_COMMUNITY)
Admission: EM | Admit: 2017-05-05 | Discharge: 2017-05-06 | Disposition: A | Discharge status: Home / Self Care | Payer: Medicare Other | Attending: Emergency Medicine | Admitting: Emergency Medicine

## 2017-05-05 ENCOUNTER — Encounter (HOSPITAL_COMMUNITY): Payer: Self-pay | Admitting: Emergency Medicine

## 2017-05-05 ENCOUNTER — Other Ambulatory Visit: Payer: Self-pay

## 2017-05-05 DIAGNOSIS — R0789 Other chest pain: Secondary | ICD-10-CM | POA: Diagnosis not present

## 2017-05-05 DIAGNOSIS — Z7982 Long term (current) use of aspirin: Secondary | ICD-10-CM | POA: Insufficient documentation

## 2017-05-05 DIAGNOSIS — Z8673 Personal history of transient ischemic attack (TIA), and cerebral infarction without residual deficits: Secondary | ICD-10-CM | POA: Insufficient documentation

## 2017-05-05 DIAGNOSIS — R079 Chest pain, unspecified: Secondary | ICD-10-CM | POA: Diagnosis not present

## 2017-05-05 DIAGNOSIS — Z79899 Other long term (current) drug therapy: Secondary | ICD-10-CM | POA: Insufficient documentation

## 2017-05-05 DIAGNOSIS — E039 Hypothyroidism, unspecified: Secondary | ICD-10-CM | POA: Insufficient documentation

## 2017-05-05 HISTORY — DX: Unspecified glaucoma: H40.9

## 2017-05-05 LAB — BASIC METABOLIC PANEL
ANION GAP: 9 (ref 5–15)
BUN: 19 mg/dL (ref 6–20)
CO2: 28 mmol/L (ref 22–32)
Calcium: 9.9 mg/dL (ref 8.9–10.3)
Chloride: 105 mmol/L (ref 101–111)
Creatinine, Ser: 0.83 mg/dL (ref 0.44–1.00)
GFR calc Af Amer: >60 mL/min (ref >60)
GFR calc non Af Amer: >60 mL/min (ref >60)
Glucose, Bld: 136 mg/dL — ABNORMAL HIGH (ref 65–99)
POTASSIUM: 4.7 mmol/L (ref 3.5–5.1)
SODIUM: 142 mmol/L (ref 135–145)

## 2017-05-05 LAB — CBC
HEMATOCRIT: 40.4 % (ref 36.0–46.0)
HEMOGLOBIN: 13.4 g/dL (ref 12.0–15.0)
MCH: 30.9 pg (ref 26.0–34.0)
MCHC: 33.2 g/dL (ref 30.0–36.0)
MCV: 93.1 fL (ref 78.0–100.0)
Platelets: 281 10*3/uL (ref 150–400)
RBC: 4.34 MIL/uL (ref 3.87–5.11)
RDW: 12.7 % (ref 11.5–15.5)
WBC: 6.8 10*3/uL (ref 4.0–10.5)

## 2017-05-05 LAB — TROPONIN I

## 2017-05-05 MED ORDER — IOPAMIDOL (ISOVUE-370) INJECTION 76%
100.0000 mL | Freq: Once | INTRAVENOUS | Status: AC | PRN
Start: 2017-05-05 — End: 2017-05-05
  Administered 2017-05-05: 100 mL via INTRAVENOUS

## 2017-05-05 NOTE — ED Notes (Signed)
Patient transported to CT 

## 2017-05-05 NOTE — ED Triage Notes (Addendum)
Patient c/o chest pain that started Monday and radiates into back. Patient does report nonproductive cough and was seen at Urgent Care. Patient had x-ray and diagnosed with bronchitis in which she was given pred pack and levofloxacin with no relief. Denies any fevers. Paient does report shortness of breath, dizziness, and diaphoresis. Denies any cardiac hx. Pain worse with movement.

## 2017-05-05 NOTE — ED Provider Notes (Signed)
[facility] EMERGENCY DEPARTMENT Provider Note   CSN: [id number] Arrival date & time: 05/05/17  1749     History   Chief Complaint Chief Complaint  Patient presents with  . Chest Pain    HPI Lori George is a 84 y.o. female.  HPI Patient has been treated for bronchitis and continues to be on Levaquin.  States that her cough has improved.  She complains of right-sided chest and right sided back pain for the last few days.  Pain seems worse with movement and coughing.  No known injuries.  No shortness of breath, fever or chills.  No new lower extremity swelling or pain. Past Medical History:  Diagnosis Date  . Glaucoma   . Thyroid disease     Patient Active Problem List   Diagnosis Date Noted  . Hyperlipidemia 05/17/2016  . GERD (gastroesophageal reflux disease) 05/17/2016  . TIA (transient ischemic attack) 05/15/2016  . Low back pain 05/15/2016  . Hypothyroidism 05/15/2016    Past Surgical History:  Procedure Laterality Date  . BACK SURGERY      OB History    No data available       Home Medications    Prior to Admission medications   Medication Sig Start Date End Date Taking? Authorizing Provider  aspirin 325 MG tablet Take 1 tablet (325 mg total) by mouth daily. 05/17/16  Yes Kari BaarsHawkins, Edward, MD  brimonidine (ALPHAGAN) 0.2 % ophthalmic solution Place 1 drop into both eyes daily. 05/30/14  Yes [provider]  calcium carbonate (TUMS - DOSED IN MG ELEMENTAL CALCIUM) 500 MG chewable tablet Chew 1 tablet by mouth daily.   Yes [provider]  CALCIUM PO Take 1 tablet by mouth daily.   Yes [provider]  levofloxacin (LEVAQUIN) 500 MG tablet Take 500 mg by mouth daily. 05/03/17  Yes [provider]  levothyroxine (SYNTHROID, LEVOTHROID) 50 MCG tablet Take 50 mcg by mouth daily before breakfast.   Yes [provider]  predniSONE (STERAPRED UNI-PAK 21 TAB) 5 MG (21) TBPK tablet See admin instructions. 05/03/17  Yes  [provider]  timolol (TIMOPTIC) 0.5 % ophthalmic solution Place 1 drop into the right eye daily.  04/18/14  Yes [provider]  traMADol (ULTRAM) 50 MG tablet Take 1 tablet (50 mg total) by mouth every 6 (six) hours as needed for severe pain. 05/06/17   Loren RacerYelverton, Emerly Prak, MD    Family History History reviewed. No pertinent family history.  Social History Social History   Tobacco Use  . Smoking status: Never Smoker  . Smokeless tobacco: Never Used  Substance Use Topics  . Alcohol use: No  . Drug use: No     Allergies   Patient has no known allergies.   Review of Systems Review of Systems  Constitutional: Negative for chills and fever.  HENT: Negative for congestion and sore throat.   Respiratory: Positive for cough. Negative for shortness of breath and wheezing.   Cardiovascular: Positive for chest pain. Negative for palpitations and leg swelling.  Gastrointestinal: Negative for abdominal pain, constipation, diarrhea, nausea and vomiting.  Genitourinary: Negative for dysuria, flank pain, frequency and hematuria.  Musculoskeletal: Positive for back pain and myalgias. Negative for neck pain and neck stiffness.  Neurological: Negative for dizziness, weakness, light-headedness, numbness and headaches.  All other systems reviewed and are negative.    Physical Exam Updated Vital Signs BP 139/81   Pulse 63   Temp (!) 97.5 F (36.4 C) (Oral)   Resp  18   Ht 5\' 4"  (1.626 m)   Wt 57.2 kg (126 lb)   SpO2 100%   BMI 21.63 kg/m   Physical Exam  Constitutional: She is oriented to person, place, and time. She appears well-developed and well-nourished. No distress.  HENT:  Head: Normocephalic and atraumatic.  Mouth/Throat: Oropharynx is clear and moist. No oropharyngeal exudate.  Eyes: EOM are normal. Pupils are equal, round, and reactive to light.  Neck: Normal range of motion. Neck supple. No JVD present.  Cardiovascular: Normal rate and regular rhythm.  Exam reveals no gallop and no friction rub.  No murmur heard. Pulmonary/Chest: Effort normal and breath sounds normal. No stridor. No respiratory distress. She has no wheezes. She has no rales. She exhibits tenderness.  Right chest wall tenderness appears reproducible with palpation.  There is no crepitance or deformity.  Abdominal: Soft. Bowel sounds are normal. There is no tenderness. There is no rebound and no guarding.  Musculoskeletal: Normal range of motion. She exhibits no edema or tenderness.  No lower extremity swelling, asymmetry or tenderness.  Distal pulses are 2+.  Patient does have right-sided thoracic paraspinal tenderness and right posterior lower rib tenderness to palpation.  No crepitance.  Neurological: She is alert and oriented to person, place, and time.  Moving all extremities without focal deficit.  Sensation intact.  Skin: Skin is warm and dry. Capillary refill takes less than 2 seconds. No rash noted. She is not diaphoretic. No erythema.  Psychiatric: She has a normal mood and affect. Her behavior is normal.  Nursing note and vitals reviewed.    ED Treatments / Results  Labs (all labs ordered are listed, but only abnormal results are displayed) Labs Reviewed  BASIC METABOLIC PANEL - Abnormal; Notable for the following components:      Result Value   Glucose, Bld 136 (*)    All other components within normal limits  CBC  TROPONIN I  TROPONIN I    EKG  EKG Interpretation  Date/Time:  Thursday May 05 2017 18:03:18 EST Ventricular Rate:  77 PR Interval:  134 QRS Duration: 78 QT Interval:  384 QTC Calculation: 434 R Axis:   74 Text Interpretation:  Normal sinus rhythm Artifact Otherwise within normal limits Confirmed by Gerhard Munch [phone]) on 05/05/2017 6:16:47 PM       Radiology No results found.  Procedures Procedures (including critical care time)  Medications Ordered in ED Medications  iopamidol (ISOVUE-370) 76 % injection 100 mL (100 mLs  Intravenous Contrast Given 05/05/17 2347)  traMADol (ULTRAM) tablet 50 mg (50 mg Oral Given 05/06/17 0024)     Initial Impression / Assessment and Plan / ED Course  I have reviewed the triage vital signs and the nursing notes.  Pertinent labs & imaging results that were available during my care of the patient were reviewed by me and considered in my medical decision making (see chart for details).    Chest pain is reproducible.  Troponin x2 is normal.  CT anterior chest without acute findings.  Will treat symptomatically.  Low suspicion for CAD.  Follow-up with primary physician and return precautions given.   Final Clinical Impressions(s) / ED Diagnoses   Final diagnoses:  Atypical chest pain    ED Discharge Orders        Ordered    traMADol (ULTRAM) 50 MG tablet  Every 6 hours PRN     05/06/17 0014       Loren Racer, MD 05/09/17 [phone]

## 2017-05-06 MED ORDER — TRAMADOL HCL 50 MG PO TABS
50.0000 mg | ORAL_TABLET | Freq: Once | ORAL | Status: AC
  Administered 2017-05-06: 50 mg via ORAL
  Filled 2017-05-06: qty 1

## 2017-05-06 MED ORDER — TRAMADOL HCL 50 MG PO TABS: 50.0000 mg | ORAL_TABLET | Freq: Four times a day (QID) | ORAL | 0 refills | Status: DC | PRN

## 2017-05-06 NOTE — ED Provider Notes (Signed)
Pt left at change of shift to get results of CTA chest. Dr Ranae PalmsYelverton has already talked to patient after reviewing the CTA as if it didn't show any acute changes.  CTA shows a pulmonary nodule, per patient history she never smoked.   Dg Chest 2 View  Result Date: 05/05/2017 CLINICAL DATA:  Mid chest pain. EXAM: CHEST - 2 VIEW COMPARISON:  Chest radiograph 07/17/2014. FINDINGS: Stable enlarged cardiac and mediastinal contours. No consolidative pulmonary opacities. No pleural effusion or pneumothorax. Thoracic spine degenerative changes. Upper lumbar spine kyphoplasty. IMPRESSION: No acute cardiopulmonary process. Electronically Signed   By: Annia Beltrew  Davis M.D.   On: 05/05/2017 19:14   Ct Angio Chest Aorta W And/or Wo Contrast  Result Date: 05/06/2017 CLINICAL DATA:  Initial evaluation for acute chest pain, radiating to back. EXAM: CT ANGIOGRAPHY CHEST WITH CONTRAST TECHNIQUE: Multidetector CT imaging of the chest was performed using the standard protocol during bolus administration of intravenous contrast. Multiplanar CT image reconstructions and MIPs were obtained to evaluate the vascular anatomy. CONTRAST:  100mL ISOVUE-370 IOPAMIDOL (ISOVUE-370) INJECTION 76% COMPARISON:  Prior radiograph from earlier the same day. FINDINGS: Cardiovascular: Precontrast imaging through the intrathoracic aorta demonstrates no mural thrombus or other acute abnormality. Mild for age atherosclerotic plaque within the aortic arch. Postcontrast imaging demonstrates no evidence for dissection or other acute aortic pathology. No aneurysm. Partially visualized great vessels within normal limits. Mild cardiomegaly. No significant pericardial effusion. Main pulmonary artery within normal limits for caliber. No definite acute abnormality about the pulmonary arterial tree on this examination. No appreciable filling defect to suggest PE. Mediastinum/Nodes: Visualized thyroid normal. No pathologically enlarged mediastinal, hilar, or axillary  lymph nodes identified. Esophagus within normal limits. No mediastinal hematoma or mass. Lungs/Pleura: Tracheobronchial tree intact and patent. Lungs well inflated bilaterally. Mild bibasilar atelectatic changes. No focal infiltrates identified. No pulmonary edema or pleural effusion. No pneumothorax. Single 4 mm nodule noted within the peripheral right upper lobe (series 7, image 25), indeterminate. No other pulmonary nodule or mass. Upper Abdomen: Visualized upper abdomen demonstrates no acute abnormality. Pancreatic duct mildly prominent measuring up to 2-3 mm, within normal limits. No appreciable pancreatic mass or other acute abnormality. Musculoskeletal: No acute osseous abnormality. L1 compression deformity with sequelae of prior vertebral augmentation noted. No worrisome lytic or blastic osseous lesions. Review of the MIP images confirms the above findings. IMPRESSION: 1. No CTA evidence for acute dissection or other acute aortic pathology. No aneurysm. 2. No other acute abnormality within the chest. 3. Remote L1 compression deformity with sequelae of prior vertebral augmentation. 4. 4 mm right upper lobe pulmonary nodule, indeterminate. No follow-up needed if patient is low-risk. Non-contrast chest CT can be considered in 12 months if patient is high-risk. This recommendation follows the consensus statement: Guidelines for Management of Incidental Pulmonary Nodules Detected on CT Images: From the Fleischner Society 2017; Radiology 2017; 284:228-243. Electronically Signed   By: Rise MuBenjamin  McClintock M.D.   On: 05/06/2017 00:33     Devoria AlbeKnapp, Kaniyah Lisby, MD 05/06/17 [phone]

## 2017-05-06 NOTE — Discharge Instructions (Addendum)
Take the medications as prescribed. Recheck if you get a fever, struggle to breathe or seem worse. You CT scan did show a nodule in your lung that your doctor needs to be aware of, they may want to do another CT scan in 1 year to make sure it isn't getting bigger.

## 2017-06-06 ENCOUNTER — Ambulatory Visit (HOSPITAL_COMMUNITY)
Admission: RE | Admit: 2017-06-06 | Discharge: 2017-06-06 | Disposition: A | Discharge status: Home / Self Care | Payer: Medicare Other | Source: Ambulatory Visit | Attending: Pulmonary Disease | Admitting: Pulmonary Disease

## 2017-06-06 ENCOUNTER — Other Ambulatory Visit (HOSPITAL_COMMUNITY): Payer: Self-pay | Admitting: Pulmonary Disease

## 2017-06-06 DIAGNOSIS — M546 Pain in thoracic spine: Secondary | ICD-10-CM | POA: Insufficient documentation

## 2017-06-06 DIAGNOSIS — M545 Low back pain: Secondary | ICD-10-CM

## 2017-06-08 ENCOUNTER — Telehealth (HOSPITAL_COMMUNITY): Payer: Self-pay

## 2017-06-08 ENCOUNTER — Other Ambulatory Visit (HOSPITAL_COMMUNITY): Payer: Self-pay | Admitting: Pulmonary Disease

## 2017-06-08 ENCOUNTER — Other Ambulatory Visit: Payer: Self-pay | Admitting: Pulmonary Disease

## 2017-06-08 DIAGNOSIS — S22060A Wedge compression fracture of T7-T8 vertebra, initial encounter for closed fracture: Secondary | ICD-10-CM

## 2017-06-08 NOTE — Telephone Encounter (Signed)
Called the referral coordinator at Dr. Juanetta GoslingHawkins office to inform her that the pt will need a mri done before we can get her in. No answer. Left message with info and number to call back with any questions. AW

## 2017-06-09 ENCOUNTER — Other Ambulatory Visit (HOSPITAL_COMMUNITY): Payer: Self-pay | Admitting: Interventional Radiology

## 2017-06-09 ENCOUNTER — Ambulatory Visit (HOSPITAL_COMMUNITY)
Admission: RE | Admit: 2017-06-09 | Discharge: 2017-06-09 | Disposition: A | Discharge status: Home / Self Care | Payer: Medicare Other | Source: Ambulatory Visit | Attending: Pulmonary Disease | Admitting: Pulmonary Disease

## 2017-06-09 ENCOUNTER — Other Ambulatory Visit (HOSPITAL_COMMUNITY): Payer: Self-pay | Admitting: Pulmonary Disease

## 2017-06-09 ENCOUNTER — Other Ambulatory Visit: Payer: Medicare Other

## 2017-06-09 DIAGNOSIS — S22060A Wedge compression fracture of T7-T8 vertebra, initial encounter for closed fracture: Secondary | ICD-10-CM

## 2017-06-09 DIAGNOSIS — M40294 Other kyphosis, thoracic region: Secondary | ICD-10-CM | POA: Insufficient documentation

## 2017-06-09 DIAGNOSIS — M4854XA Collapsed vertebra, not elsewhere classified, thoracic region, initial encounter for fracture: Secondary | ICD-10-CM | POA: Insufficient documentation

## 2017-06-09 DIAGNOSIS — R600 Localized edema: Secondary | ICD-10-CM | POA: Insufficient documentation

## 2017-06-10 DIAGNOSIS — M4850XA Collapsed vertebra, not elsewhere classified, site unspecified, initial encounter for fracture: Secondary | ICD-10-CM | POA: Diagnosis not present

## 2017-06-10 DIAGNOSIS — K210 Gastro-esophageal reflux disease with esophagitis: Secondary | ICD-10-CM | POA: Diagnosis not present

## 2017-06-10 DIAGNOSIS — K5900 Constipation, unspecified: Secondary | ICD-10-CM | POA: Diagnosis not present

## 2017-06-10 DIAGNOSIS — M545 Low back pain: Secondary | ICD-10-CM | POA: Diagnosis not present

## 2017-06-13 ENCOUNTER — Other Ambulatory Visit (HOSPITAL_COMMUNITY): Payer: Self-pay | Admitting: Interventional Radiology

## 2017-06-13 ENCOUNTER — Ambulatory Visit (HOSPITAL_COMMUNITY)
Admission: RE | Admit: 2017-06-13 | Discharge: 2017-06-13 | Disposition: A | Discharge status: Home / Self Care | Payer: Medicare Other | Source: Ambulatory Visit | Attending: Interventional Radiology | Admitting: Interventional Radiology

## 2017-06-13 DIAGNOSIS — S22060A Wedge compression fracture of T7-T8 vertebra, initial encounter for closed fracture: Secondary | ICD-10-CM

## 2017-06-13 DIAGNOSIS — M549 Dorsalgia, unspecified: Secondary | ICD-10-CM

## 2017-06-13 DIAGNOSIS — M899 Disorder of bone, unspecified: Secondary | ICD-10-CM | POA: Diagnosis not present

## 2017-06-13 DIAGNOSIS — M4854XA Collapsed vertebra, not elsewhere classified, thoracic region, initial encounter for fracture: Secondary | ICD-10-CM | POA: Diagnosis not present

## 2017-06-13 HISTORY — PX: IR RADIOLOGIST EVAL & MGMT: IMG5224

## 2017-06-13 NOTE — Consult Note (Signed)
Chief Complaint: Patient was seen in consultation today for thoracic 7 and thoracic 8 compression fractures.  Referring Physician(s): Kari BaarsHawkins, Edward  Supervising Physician: Julieanne Cottoneveshwar, Sanjeev  [facility]  History of Present Illness: Lori George is a 84 y.o. female  Hx osteoporosis, hypothyroidism, and previous compression fractures. Hx back pain x2 months without known injury.  MRI thoracic spine 06/09/2017: 1. Edema throughout the vertebral bodies at T7 and T8 compatible with acute/subacute compression fractures. Given disease elsewhere, these may represent pathologic fractures. 2. Rounded T2 hyperintensities at T6, T10, and L1 as described raise concern for metastatic disease to the thoracic spine. Biopsy or postcontrast imaging could be used for further evaluation. 3. Exaggerated thoracic kyphosis without focal disc protrusion or stenosis. 4. Spinal augmentation at L2 associated with a remote fracture.  IR consulted by Dr. Juanetta GoslingHawkins for management of patient's thoracic 7 and thoracic 8 compression fractures. Patient accompanied by her 3 children (2 sons, 1 daughter) at bedside. Complains of constant lower thoracic back pain rated 10/10 at its worse. Patient states Dr. Juanetta GoslingHawkins prescribed her Hydrocodone for her back pain. Denies numbness/tingling down legs and bladder/bowel incontinence.  Past Medical History:  Diagnosis Date  . Glaucoma   . Thyroid disease     Past Surgical History:  Procedure Laterality Date  . BACK SURGERY      Allergies: Patient has no known allergies.  Medications: Prior to Admission medications   Medication Sig Start Date End Date Taking? Authorizing Provider  aspirin 325 MG tablet Take 1 tablet (325 mg total) by mouth daily. 05/17/16   Kari BaarsHawkins, Edward, MD  brimonidine (ALPHAGAN) 0.2 % ophthalmic solution Place 1 drop into both eyes daily. 05/30/14   [provider]  calcium carbonate (TUMS - DOSED IN MG ELEMENTAL  CALCIUM) 500 MG chewable tablet Chew 1 tablet by mouth daily.    [provider]  CALCIUM PO Take 1 tablet by mouth daily.    [provider]  levofloxacin (LEVAQUIN) 500 MG tablet Take 500 mg by mouth daily. 05/03/17   [provider]  levothyroxine (SYNTHROID, LEVOTHROID) 50 MCG tablet Take 50 mcg by mouth daily before breakfast.    [provider]  predniSONE (STERAPRED UNI-PAK 21 TAB) 5 MG (21) TBPK tablet See admin instructions. 05/03/17   [provider]  timolol (TIMOPTIC) 0.5 % ophthalmic solution Place 1 drop into the right eye daily.  04/18/14   [provider]  traMADol (ULTRAM) 50 MG tablet Take 1 tablet (50 mg total) by mouth every 6 (six) hours as needed for severe pain. 05/06/17   Loren RacerYelverton, David, MD     No family history on file.  Social History   Socioeconomic History  . Marital status: Widowed    Spouse name: Not on file  . Number of children: Not on file  . Years of education: Not on file  . Highest education level: Not on file  Occupational History  . Not on file  Social Needs  . Financial resource strain: Not on file  . Food insecurity:    Worry: Not on file    Inability: Not on file  . Transportation needs:    Medical: Not on file    Non-medical: Not on file  Tobacco Use  . Smoking status: Never Smoker  . Smokeless tobacco: Never Used  Substance and Sexual Activity  . Alcohol use: No  . Drug use: No  . Sexual activity: Not on file  Lifestyle  . Physical activity:  Days per week: Not on file    Minutes per session: Not on file  . Stress: Not on file  Relationships  . Social connections:    Talks on phone: Not on file    Gets together: Not on file    Attends religious service: Not on file    Active member of club or organization: Not on file    Attends meetings of clubs or organizations: Not on file    Relationship status: Not on file  Other Topics Concern  . Not on file  Social History Narrative    . Not on file     Review of Systems: A 12 point ROS discussed and pertinent positives are indicated in the HPI above.  All other systems are negative.  Review of Systems  Constitutional: Negative for fever.  Respiratory: Negative for shortness of breath and wheezing.   Cardiovascular: Negative for chest pain and palpitations.  Gastrointestinal:       Negative for bowel incontinence.  Genitourinary:       Negative for bladder incontinence.  Musculoskeletal: Positive for back pain.  Neurological: Negative for numbness.  Psychiatric/Behavioral: Negative for behavioral problems and confusion.    Vital Signs: There were no vitals taken for this visit.  Physical Exam  Constitutional: She is oriented to person, place, and time. She appears well-developed and well-nourished. No distress.  Musculoskeletal:  Moderate midline tenderness of lower thoracic region.  Neurological: She is alert and oriented to person, place, and time.  Psychiatric: She has a normal mood and affect. Her behavior is normal. Judgment and thought content normal.    Imaging: Dg Thoracic Spine W/swimmers  Result Date: 06/06/2017 CLINICAL DATA:  Mid back pain for the past month.  No known injury. EXAM: THORACIC SPINE - 3 VIEWS COMPARISON:  Coronal and sagittal reconstructed images through the thoracic spine from a chest CT scan of May 05, 2017 FINDINGS: The thoracic vertebral bodies are preserved in height. There is new compression of T7 with loss of height anteriorly of 60% and posteriorly of 10%. There is mild multilevel degenerative disc space narrowing. The cervicothoracic and thoracolumbar junctions are grossly normal. The patient has undergone previous kyphoplasty at L2. IMPRESSION: New wedge compression of the body of T7 since the CT scan of May 05, 2017. Electronically Signed   By: David  [place] M.D.   On: 06/06/2017 17:06   Mr Thoracic Spine Wo Contrast  Result Date: 06/09/2017 CLINICAL DATA:  Lumbar back  pain 2 months. No known injury. T7 compression fracture. EXAM: MRI THORACIC SPINE WITHOUT CONTRAST TECHNIQUE: Multiplanar, multisequence MR imaging of the thoracic spine was performed. No intravenous contrast was administered. COMPARISON:  Thoracic spine radiographs 06/06/2017. CT chest 05/05/2017. FINDINGS: Alignment:  AP alignment is anatomic. Vertebrae: There is diffuse edema throughout the T7 vertebral body compatible with an acute/subacute inferior endplate compression fracture. There is also diffuse edema involving the T8 vertebral body with some loss of height superiorly. The STIR sequence demonstrates at least 3 rounded lesions in the thoracic spine. A 9 mm lesion is present posteriorly and to the right at T6. A 10 mm lesion involves the superior endplate of T10 on the left. A 12 mm lesion is noted anteriorly at L1, just to the left of midline. Indistinct T2 signal is noted superiorly at T2. Vertebral body heights are maintained. Vertebral augmentation is noted at L2 with the remote fracture. This appears healed. Cord:  Normal signal is present to the conus medullaris at L2. Paraspinal and  other soft tissues: Small bilateral pleural effusions are worse on the right. Visualized lung fields are otherwise clear. Limited imaging of the abdomen is unremarkable. Paraspinous musculature is within normal limits for age. Disc levels: No significant focal disc protrusion or central canal stenosis is present. The foramina are patent bilaterally. IMPRESSION: 1. Edema throughout the vertebral bodies at T7 and T8 compatible with acute/subacute compression fractures. Given disease elsewhere, these may represent pathologic fractures. 2. Rounded T2 hyperintensities at T6, T10, and L1 as described raise concern for metastatic disease to the thoracic spine. Biopsy or postcontrast imaging could be used for further evaluation. 3. Exaggerated thoracic kyphosis without focal disc protrusion or stenosis. 4. Spinal augmentation at  L2 associated with a remote fracture. Electronically Signed   By: Marin Roberts M.D.   On: 06/09/2017 14:09    Labs:  CBC: Recent Labs    05/05/17 2012  WBC 6.8  HGB 13.4  HCT 40.4  PLT 281    COAGS: No results for input(s): INR, APTT in the last 8760 hours.  BMP: Recent Labs    05/05/17 2012  NA 142  K 4.7  CL 105  CO2 28  GLUCOSE 136*  BUN 19  CALCIUM 9.9  CREATININE 0.83  GFRNONAA >60  GFRAA >60    LIVER FUNCTION TESTS: No results for input(s): BILITOT, AST, ALT, ALKPHOS, PROT, ALBUMIN in the last 8760 hours.  TUMOR MARKERS: No results for input(s): AFPTM, CEA, CA199, CHROMGRNA in the last 8760 hours.  Assessment and Plan:  Thoracic 7 and thoracic 8 compression fractures. Imaging reviewed with patient and family members. Explained kyphoplasty/vertebroplasty procedure to patient and family members, including risks and benefits. Explained that Dr. Juanetta Gosling has asked [place] to obtain tissue biopsies at thoracic 7, thoracic 8, and thoracic 10, which will take place at the same time as the procedure.  Plan for image-guided thoracic 7 and thoracic 8 kyphoplasties/vertebroplasties along with thoracic 7, thoracic 8, and thoracic 10 biopsies on 06/17/2017 with Dr. Corliss Skains. Instructed patient to continue pain management regimen as directed by Dr. Juanetta Gosling.  All questions answered and concerns addressed. Patient and family members conveys understanding and agrees with plan.  Thank you for this interesting consult.  I greatly enjoyed meeting Lori George and look forward to participating in their care.  A copy of this report was sent to the requesting provider on this date.  Electronically Signed: Elwin Mocha, PA-C 06/13/2017, 4:00 PM   I spent a total of 30 Minutes in face to face in clinical consultation, greater than 50% of which was counseling/coordinating care for thoracic 7 and thoracic 8 compression fractures.

## 2017-06-16 ENCOUNTER — Other Ambulatory Visit: Payer: Self-pay | Admitting: Radiology

## 2017-06-17 ENCOUNTER — Encounter (HOSPITAL_COMMUNITY): Payer: Self-pay

## 2017-06-17 ENCOUNTER — Other Ambulatory Visit (HOSPITAL_COMMUNITY): Payer: Self-pay | Admitting: Interventional Radiology

## 2017-06-17 ENCOUNTER — Ambulatory Visit (HOSPITAL_COMMUNITY)
Admission: RE | Admit: 2017-06-17 | Discharge: 2017-06-17 | Disposition: A | Discharge status: Home / Self Care | Payer: Medicare Other | Source: Ambulatory Visit | Attending: Interventional Radiology | Admitting: Interventional Radiology

## 2017-06-17 DIAGNOSIS — Z7982 Long term (current) use of aspirin: Secondary | ICD-10-CM | POA: Insufficient documentation

## 2017-06-17 DIAGNOSIS — M4854XA Collapsed vertebra, not elsewhere classified, thoracic region, initial encounter for fracture: Secondary | ICD-10-CM | POA: Insufficient documentation

## 2017-06-17 DIAGNOSIS — H409 Unspecified glaucoma: Secondary | ICD-10-CM | POA: Diagnosis not present

## 2017-06-17 DIAGNOSIS — M549 Dorsalgia, unspecified: Secondary | ICD-10-CM

## 2017-06-17 DIAGNOSIS — S22060A Wedge compression fracture of T7-T8 vertebra, initial encounter for closed fracture: Secondary | ICD-10-CM

## 2017-06-17 DIAGNOSIS — E039 Hypothyroidism, unspecified: Secondary | ICD-10-CM | POA: Diagnosis not present

## 2017-06-17 DIAGNOSIS — M40204 Unspecified kyphosis, thoracic region: Secondary | ICD-10-CM | POA: Diagnosis not present

## 2017-06-17 DIAGNOSIS — M810 Age-related osteoporosis without current pathological fracture: Secondary | ICD-10-CM | POA: Insufficient documentation

## 2017-06-17 DIAGNOSIS — M899 Disorder of bone, unspecified: Secondary | ICD-10-CM | POA: Diagnosis not present

## 2017-06-17 HISTORY — PX: IR KYPHO EA ADDL LEVEL THORACIC OR LUMBAR: IMG5520

## 2017-06-17 HISTORY — PX: IR FLUORO GUIDED NEEDLE PLC ASPIRATION/INJECTION LOC: IMG2395

## 2017-06-17 HISTORY — PX: IR KYPHO THORACIC WITH BONE BIOPSY: IMG5518

## 2017-06-17 LAB — BASIC METABOLIC PANEL
Anion gap: 11 (ref 5–15)
BUN: 21 mg/dL — AB (ref 6–20)
CHLORIDE: 104 mmol/L (ref 101–111)
CO2: 24 mmol/L (ref 22–32)
CREATININE: 0.82 mg/dL (ref 0.44–1.00)
Calcium: 9.8 mg/dL (ref 8.9–10.3)
GFR calc non Af Amer: >60 mL/min (ref >60)
Glucose, Bld: 115 mg/dL — ABNORMAL HIGH (ref 65–99)
POTASSIUM: 4.2 mmol/L (ref 3.5–5.1)
Sodium: 139 mmol/L (ref 135–145)

## 2017-06-17 LAB — CBC
HCT: 40.3 % (ref 36.0–46.0)
Hemoglobin: 13.2 g/dL (ref 12.0–15.0)
MCH: 30.6 pg (ref 26.0–34.0)
MCHC: 32.8 g/dL (ref 30.0–36.0)
MCV: 93.3 fL (ref 78.0–100.0)
PLATELETS: 329 10*3/uL (ref 150–400)
RBC: 4.32 MIL/uL (ref 3.87–5.11)
RDW: 13 % (ref 11.5–15.5)
WBC: 5.9 10*3/uL (ref 4.0–10.5)

## 2017-06-17 LAB — PROTIME-INR
INR: 1.02
Prothrombin Time: 13.3 seconds (ref 11.4–15.2)

## 2017-06-17 MED ORDER — LIDOCAINE HCL (PF) 1 % IJ SOLN
INTRAMUSCULAR | Status: AC | PRN
  Administered 2017-06-17: 10 mL

## 2017-06-17 MED ORDER — TOBRAMYCIN SULFATE 1.2 G IJ SOLR
INTRAMUSCULAR | Status: AC
  Filled 2017-06-17: qty 1.2

## 2017-06-17 MED ORDER — BUPIVACAINE HCL (PF) 0.5 % IJ SOLN
INTRAMUSCULAR | Status: AC
  Filled 2017-06-17: qty 60

## 2017-06-17 MED ORDER — HYDROMORPHONE HCL 2 MG/ML IJ SOLN
INTRAMUSCULAR | Status: AC
  Filled 2017-06-17: qty 1

## 2017-06-17 MED ORDER — SODIUM CHLORIDE 0.9 % IV SOLN: INTRAVENOUS | Status: DC

## 2017-06-17 MED ORDER — CEFAZOLIN SODIUM-DEXTROSE 2-4 GM/100ML-% IV SOLN
INTRAVENOUS | Status: AC
  Administered 2017-06-17: 2 g via INTRAVENOUS
  Filled 2017-06-17: qty 100

## 2017-06-17 MED ORDER — FENTANYL CITRATE (PF) 100 MCG/2ML IJ SOLN
INTRAMUSCULAR | Status: AC | PRN
  Administered 2017-06-17 (×3): 25 ug via INTRAVENOUS

## 2017-06-17 MED ORDER — IOPAMIDOL (ISOVUE-300) INJECTION 61%
INTRAVENOUS | Status: AC
  Filled 2017-06-17: qty 50

## 2017-06-17 MED ORDER — SODIUM CHLORIDE 0.9 % IV SOLN
INTRAVENOUS | Status: DC
  Administered 2017-06-17: 09:00:00 via INTRAVENOUS

## 2017-06-17 MED ORDER — MIDAZOLAM HCL 2 MG/2ML IJ SOLN
INTRAMUSCULAR | Status: AC | PRN
  Administered 2017-06-17 (×2): 1 mg via INTRAVENOUS

## 2017-06-17 MED ORDER — MIDAZOLAM HCL 2 MG/2ML IJ SOLN
INTRAMUSCULAR | Status: AC
  Filled 2017-06-17: qty 2

## 2017-06-17 MED ORDER — FENTANYL CITRATE (PF) 100 MCG/2ML IJ SOLN
INTRAMUSCULAR | Status: AC
  Filled 2017-06-17: qty 2

## 2017-06-17 MED ORDER — CEFAZOLIN SODIUM-DEXTROSE 2-4 GM/100ML-% IV SOLN
2.0000 g | INTRAVENOUS | Status: AC
  Administered 2017-06-17: 2 g via INTRAVENOUS

## 2017-06-17 NOTE — Sedation Documentation (Signed)
Patient is resting comfortably. 

## 2017-06-17 NOTE — Procedures (Signed)
S/p T 7 and T8 balloon kyphoplasty with biopsy. S/P T 10 biopsy S.Kayvon Mo MD

## 2017-06-17 NOTE — H&P (Signed)
Chief Complaint: Patient was seen in consultation today for thoracic 7 and thoracic 8 compression fractures.  Referring Physician(s): Kari Baars  Supervising Physician: Julieanne Cotton  Patient Status: [facility] - Out-pt  History of Present Illness: Lori George is a 84 y.o. female  Hx osteoporosis, hypothyroidism, and previous compression fractures. Hx back pain x2 months without known injury.  MRI thoracic spine 06/09/2017: 1. Edema throughout the vertebral bodies at T7 and T8 compatible with acute/subacute compression fractures. Given disease elsewhere, these may represent pathologic fractures. 2. Rounded T2 hyperintensities at T6, T10, and L1 as described raise concern for metastatic disease to the thoracic spine. Biopsy or postcontrast imaging could be used for further evaluation. 3. Exaggerated thoracic kyphosis without focal disc protrusion or stenosis. 4. Spinal augmentation at L2 associated with a remote fracture.  Consulted with Dr. Corliss Skains 06/13/2017 for management of thoracic 7 and thoracic 8 compression fractures.  Patient presents today for image-guided thoracic 7 and thoracic 8 kyphoplasties/vertebralplasties with thoracic 7, thoracic 8, and thoracic 10 biopsies with Dr. Corliss Skains. Accompanied by son at bedside. Complains of back pain, rated 2-3/10. States she took her Hydrocodone this AM for pain. Denies numbness/tingling down legs and bladder/bowel incontinence.  Past Medical History:  Diagnosis Date  . Glaucoma   . Thyroid disease     Past Surgical History:  Procedure Laterality Date  . BACK SURGERY      Allergies: Patient has no known allergies.  Medications: Prior to Admission medications   Medication Sig Start Date End Date Taking? Authorizing Provider  aspirin EC 81 MG tablet Take 81 mg by mouth daily.   Yes [provider]  brimonidine (ALPHAGAN) 0.2 % ophthalmic solution Place 1 drop into both eyes daily. 05/30/14  Yes [provider]  calcium carbonate (TUMS - DOSED IN MG ELEMENTAL CALCIUM) 500 MG chewable tablet Chew 1 tablet by mouth daily as needed for heartburn.    Yes [provider]  CALCIUM PO Take 1 tablet by mouth daily.   Yes [provider]  HYDROcodone-acetaminophen (NORCO/VICODIN) 5-325 MG tablet Take 1 tablet by mouth every 6 (six) hours as needed for moderate pain.   Yes [provider]  levothyroxine (SYNTHROID, LEVOTHROID) 50 MCG tablet Take 50 mcg by mouth daily before breakfast.   Yes [provider]  linaclotide (LINZESS) 72 MCG capsule Take 72 mcg by mouth daily before breakfast.   Yes [provider]  pantoprazole (PROTONIX) 40 MG tablet Take 1 tablet by mouth daily. 06/06/17  Yes [provider]  timolol (TIMOPTIC) 0.5 % ophthalmic solution Place 1 drop into the right eye daily.  04/18/14   [provider]     History reviewed. No pertinent family history.  Social History   Socioeconomic History  . Marital status: Widowed    Spouse name: Not on file  . Number of children: Not on file  . Years of education: Not on file  . Highest education level: Not on file  Occupational History  . Not on file  Social Needs  . Financial resource strain: Not on file  . Food insecurity:    Worry: Not on file    Inability: Not on file  . Transportation needs:    Medical: Not on file    Non-medical: Not on file  Tobacco Use  . Smoking status: Never Smoker  . Smokeless tobacco: Never Used  Substance and Sexual Activity  . Alcohol use: No  . Drug use: No  . Sexual activity:  Not on file  Lifestyle  . Physical activity:    Days per week: Not on file    Minutes per session: Not on file  . Stress: Not on file  Relationships  . Social connections:    Talks on phone: Not on file    Gets together: Not on file    Attends religious service: Not on file    Active member of club or organization: Not on file    Attends meetings of clubs  or organizations: Not on file    Relationship status: Not on file  Other Topics Concern  . Not on file  Social History Narrative  . Not on file     Review of Systems: A 12 point ROS discussed and pertinent positives are indicated in the HPI above.  All other systems are negative.  Review of Systems  Constitutional: Negative for activity change and fever.  Respiratory: Negative for shortness of breath and wheezing.   Cardiovascular: Negative for chest pain and palpitations.  Gastrointestinal:       Denies bowel incontinence.  Genitourinary:       Denies bladder incontinence.  Neurological: Negative for numbness.  Psychiatric/Behavioral: Negative for behavioral problems and confusion.    Vital Signs: BP 140/87   Pulse 85   Temp 98.1 F (36.7 C)   Resp 18   Ht 5\' 4"  (1.626 m)   Wt 116 lb (52.6 kg)   SpO2 99%   BMI 19.91 kg/m   Physical Exam  Constitutional: She is oriented to person, place, and time. She appears well-developed and well-nourished. No distress.  Cardiovascular: Normal rate, regular rhythm, normal heart sounds and intact distal pulses.  No murmur heard. Pulmonary/Chest: Effort normal and breath sounds normal. She has no wheezes.  Musculoskeletal:  Mild tenderness of midline lower back.  Neurological: She is alert and oriented to person, place, and time.  Skin: Skin is warm and dry.  Psychiatric: She has a normal mood and affect. Her behavior is normal. Judgment and thought content normal.  Nursing note and vitals reviewed.    MD Evaluation Airway: WNL Heart: WNL Abdomen: WNL Chest/ Lungs: WNL ASA  Classification: 2 Mallampati/Airway Score: Two   Imaging: Dg Thoracic Spine W/swimmers  Result Date: 06/06/2017 CLINICAL DATA:  Mid back pain for the past month.  No known injury. EXAM: THORACIC SPINE - 3 VIEWS COMPARISON:  Coronal and sagittal reconstructed images through the thoracic spine from a chest CT scan of May 05, 2017 FINDINGS: The thoracic  vertebral bodies are preserved in height. There is new compression of T7 with loss of height anteriorly of 60% and posteriorly of 10%. There is mild multilevel degenerative disc space narrowing. The cervicothoracic and thoracolumbar junctions are grossly normal. The patient has undergone previous kyphoplasty at L2. IMPRESSION: New wedge compression of the body of T7 since the CT scan of May 05, 2017. Electronically Signed   By: David  [place] M.D.   On: 06/06/2017 17:06   Mr Thoracic Spine Wo Contrast  Result Date: 06/09/2017 CLINICAL DATA:  Lumbar back pain 2 months. No known injury. T7 compression fracture. EXAM: MRI THORACIC SPINE WITHOUT CONTRAST TECHNIQUE: Multiplanar, multisequence MR imaging of the thoracic spine was performed. No intravenous contrast was administered. COMPARISON:  Thoracic spine radiographs 06/06/2017. CT chest 05/05/2017. FINDINGS: Alignment:  AP alignment is anatomic. Vertebrae: There is diffuse edema throughout the T7 vertebral body compatible with an acute/subacute inferior endplate compression fracture. There is also diffuse edema involving the T8 vertebral body with  some loss of height superiorly. The STIR sequence demonstrates at least 3 rounded lesions in the thoracic spine. A 9 mm lesion is present posteriorly and to the right at T6. A 10 mm lesion involves the superior endplate of T10 on the left. A 12 mm lesion is noted anteriorly at L1, just to the left of midline. Indistinct T2 signal is noted superiorly at T2. Vertebral body heights are maintained. Vertebral augmentation is noted at L2 with the remote fracture. This appears healed. Cord:  Normal signal is present to the conus medullaris at L2. Paraspinal and other soft tissues: Small bilateral pleural effusions are worse on the right. Visualized lung fields are otherwise clear. Limited imaging of the abdomen is unremarkable. Paraspinous musculature is within normal limits for age. Disc levels: No significant focal disc  protrusion or central canal stenosis is present. The foramina are patent bilaterally. IMPRESSION: 1. Edema throughout the vertebral bodies at T7 and T8 compatible with acute/subacute compression fractures. Given disease elsewhere, these may represent pathologic fractures. 2. Rounded T2 hyperintensities at T6, T10, and L1 as described raise concern for metastatic disease to the thoracic spine. Biopsy or postcontrast imaging could be used for further evaluation. 3. Exaggerated thoracic kyphosis without focal disc protrusion or stenosis. 4. Spinal augmentation at L2 associated with a remote fracture. Electronically Signed   By: Marin Roberts M.D.   On: 06/09/2017 14:09    Labs:  CBC: Recent Labs    05/05/17 2012  WBC 6.8  HGB 13.4  HCT 40.4  PLT 281    COAGS: No results for input(s): INR, APTT in the last 8760 hours.  BMP: Recent Labs    05/05/17 2012  NA 142  K 4.7  CL 105  CO2 28  GLUCOSE 136*  BUN 19  CALCIUM 9.9  CREATININE 0.83  GFRNONAA >60  GFRAA >60    LIVER FUNCTION TESTS: No results for input(s): BILITOT, AST, ALT, ALKPHOS, PROT, ALBUMIN in the last 8760 hours.  TUMOR MARKERS: No results for input(s): AFPTM, CEA, CA199, CHROMGRNA in the last 8760 hours.  Assessment and Plan:  Thoracic 7 and thoracic 8 compression fractures. Plan for image-guided thoracic 7 and thoracic 8 kyphoplasties/vertebralplasties with thoracic 7, thoracic 8, and thoracic 10 biopsies with Dr. Corliss Skains this AM. Patient is NPO. She denies fever and WBCs WNL. INR pending.  Risks and benefits of thoracic 7 and thoracic 8 kyphoplasties/vertebralplasties were discussed with the patient including, but not limited to education regarding the natural healing process of compression fractures without intervention, bleeding, infection, cement migration which may cause spinal cord damage, paralysis, pulmonary embolism or even death. This interventional procedure involves the use of X-rays and  because of the nature of the planned procedure, it is possible that we will have prolonged use of X-ray fluoroscopy. Potential radiation risks to you include (but are not limited to) the following: - A slightly elevated risk for cancer  several years later in life. This risk is typically less than 0.5% percent. This risk is low in comparison to the normal incidence of human cancer, which is 33% for women and 50% for men according to the [employer]. - Radiation induced injury can include skin redness, resembling a rash, tissue breakdown / ulcers and hair loss (which can be temporary or permanent).  The likelihood of either of these occurring depends on the difficulty of the procedure and whether you are sensitive to radiation due to previous procedures, disease, or genetic conditions.  IF your procedure requires  a prolonged use of radiation, you will be notified and given written instructions for further action.  It is your responsibility to monitor the irradiated area for the 2 weeks following the procedure and to notify your physician if you are concerned that you have suffered a radiation induced injury.   All of the patient's questions were answered, patient is agreeable to proceed. Consent signed and in chart.  Thank you for this interesting consult.  I greatly enjoyed meeting Lori George and look forward to participating in their care.  A copy of this report was sent to the requesting provider on this date.  Electronically Signed: Elwin MochaAlexandra Hortensia Duffin, PA-C 06/17/2017, 8:52 AM   I spent a total of 30 minutes in face to face in clinical consultation, greater than 50% of which was counseling/coordinating care for thoracic 7 and thoracic 8 compression fractures.

## 2017-06-17 NOTE — Sedation Documentation (Signed)
Vital signs stable. 

## 2017-06-17 NOTE — Discharge Instructions (Signed)
**Note [place] Echavarria-identified via Obfuscation** Balloon Kyphoplasty, Care After °Refer to this sheet in the next few weeks. These instructions provide you with information about caring for yourself after your procedure. Your health care provider may also give you more specific instructions. Your treatment has been planned according to current medical practices, but problems sometimes occur. Call your health care provider if you have any problems or questions after your procedure. °What can I expect after the procedure? °After your procedure, it is common to have back pain. °Follow these instructions at home: °Incision care °· Follow instructions from your health care provider about how to take care of your incisions. Make sure you: °? Wash your hands with soap and water before you change your bandage (dressing). If soap and water are not available, use hand sanitizer. °? Change your dressing as told by your health care provider. °? Leave stitches (sutures), skin glue, or adhesive strips in place. These skin closures may need to be in place for 2 weeks or longer. If adhesive strip edges start to loosen and curl up, you may trim the loose edges. Do not remove adhesive strips completely unless your health care provider tells you to do that. °· Check your incision area every day for signs of infection. Watch for: °? Redness, swelling, or pain. °? Fluid, blood, or pus. °· Keep your dressing dry until your health care provider says that it can be removed. °Activity ° °· Rest your back and avoid intense physical activity for as long as told by your health care provider. °· Return to your normal activities as told by your health care provider. Ask your health care provider what activities are safe for you. °· Do not lift anything that is heavier than 10 lb (4.5 kg). This is about the weight of a gallon of milk. You may need to avoid heavy lifting for several weeks. °General instructions °· Take over-the-counter and prescription medicines only as told by your health care  provider. °· If directed, apply ice to the painful area: °? Put ice in a plastic bag. °? Place a towel between your skin and the bag. °? Leave the ice on for 20 minutes, 2-3 times per day. °· Do not use tobacco products, including cigarettes, chewing tobacco, or e-cigarettes. If you need help quitting, ask your health care provider. °· Keep all follow-up visits as told by your health care provider. This is important. °Contact a health care provider if: °· You have a fever. °· You have redness, swelling, or pain at the site of your incisions. °· You have fluid, blood, or pus coming from your incisions. °· You have pain that gets worse or does not get better with medicine. °· You develop numbness or weakness in any part of your body. °Get help right away if: °· You have chest pain. °· You have difficulty breathing. °· You cannot move your legs. °· You cannot control your bladder or bowel movements. °· You suddenly become weak or numb on one side of your body. °· You become very confused. °· You have trouble speaking or understanding, or both. °This information is not intended to replace advice given to you by your health care provider. Make sure you discuss any questions you have with your health care provider. °Document Released: 11/06/2014 Document Revised: 07/24/2015 Document Reviewed: 06/10/2014 °[employer] Interactive Patient Education © 2018 [employer] ° °

## 2017-06-20 ENCOUNTER — Encounter (HOSPITAL_COMMUNITY): Payer: Self-pay | Admitting: Interventional Radiology

## 2017-06-23 ENCOUNTER — Encounter (HOSPITAL_COMMUNITY): Payer: Self-pay | Admitting: Interventional Radiology

## 2017-06-29 ENCOUNTER — Other Ambulatory Visit (HOSPITAL_COMMUNITY): Payer: Self-pay | Admitting: Pulmonary Disease

## 2017-06-29 ENCOUNTER — Ambulatory Visit (HOSPITAL_COMMUNITY)
Admission: RE | Admit: 2017-06-29 | Discharge: 2017-06-29 | Disposition: A | Discharge status: Home / Self Care | Payer: Medicare Other | Source: Ambulatory Visit | Attending: Pulmonary Disease | Admitting: Pulmonary Disease

## 2017-06-29 ENCOUNTER — Emergency Department (HOSPITAL_COMMUNITY): Payer: Medicare Other

## 2017-06-29 ENCOUNTER — Encounter (HOSPITAL_COMMUNITY): Payer: Self-pay

## 2017-06-29 ENCOUNTER — Emergency Department (HOSPITAL_COMMUNITY)
Admission: EM | Admit: 2017-06-29 | Discharge: 2017-06-29 | Disposition: A | Discharge status: Home / Self Care | Payer: Medicare Other | Attending: Emergency Medicine | Admitting: Emergency Medicine

## 2017-06-29 DIAGNOSIS — Z8673 Personal history of transient ischemic attack (TIA), and cerebral infarction without residual deficits: Secondary | ICD-10-CM | POA: Insufficient documentation

## 2017-06-29 DIAGNOSIS — R42 Dizziness and giddiness: Secondary | ICD-10-CM

## 2017-06-29 DIAGNOSIS — M546 Pain in thoracic spine: Secondary | ICD-10-CM | POA: Diagnosis not present

## 2017-06-29 DIAGNOSIS — R52 Pain, unspecified: Secondary | ICD-10-CM

## 2017-06-29 DIAGNOSIS — Z7982 Long term (current) use of aspirin: Secondary | ICD-10-CM | POA: Insufficient documentation

## 2017-06-29 DIAGNOSIS — M8580 Other specified disorders of bone density and structure, unspecified site: Secondary | ICD-10-CM

## 2017-06-29 DIAGNOSIS — M549 Dorsalgia, unspecified: Secondary | ICD-10-CM | POA: Insufficient documentation

## 2017-06-29 DIAGNOSIS — Z79899 Other long term (current) drug therapy: Secondary | ICD-10-CM | POA: Diagnosis not present

## 2017-06-29 DIAGNOSIS — M545 Low back pain: Secondary | ICD-10-CM | POA: Diagnosis not present

## 2017-06-29 DIAGNOSIS — R51 Headache: Secondary | ICD-10-CM | POA: Diagnosis not present

## 2017-06-29 DIAGNOSIS — E039 Hypothyroidism, unspecified: Secondary | ICD-10-CM | POA: Diagnosis not present

## 2017-06-29 DIAGNOSIS — M5489 Other dorsalgia: Secondary | ICD-10-CM

## 2017-06-29 HISTORY — DX: Cerebral infarction, unspecified: I63.9

## 2017-06-29 LAB — CBC WITH DIFFERENTIAL/PLATELET
Basophils Absolute: 0 10*3/uL (ref 0.0–0.1)
Basophils Relative: 0 %
Eosinophils Absolute: 0.1 10*3/uL (ref 0.0–0.7)
Eosinophils Relative: 1 %
HCT: 39.4 % (ref 36.0–46.0)
Hemoglobin: 12.9 g/dL (ref 12.0–15.0)
Lymphocytes Relative: 27 %
Lymphs Abs: 1.8 10*3/uL (ref 0.7–4.0)
MCH: 30.6 pg (ref 26.0–34.0)
MCHC: 32.7 g/dL (ref 30.0–36.0)
MCV: 93.4 fL (ref 78.0–100.0)
Monocytes Absolute: 0.6 10*3/uL (ref 0.1–1.0)
Monocytes Relative: 8 %
Neutro Abs: 4.3 10*3/uL (ref 1.7–7.7)
Neutrophils Relative %: 64 %
Platelets: 290 10*3/uL (ref 150–400)
RBC: 4.22 MIL/uL (ref 3.87–5.11)
RDW: 12.9 % (ref 11.5–15.5)
WBC: 6.7 10*3/uL (ref 4.0–10.5)

## 2017-06-29 LAB — COMPREHENSIVE METABOLIC PANEL
ALT: 12 U/L — ABNORMAL LOW (ref 14–54)
AST: 16 U/L (ref 15–41)
Albumin: 4.1 g/dL (ref 3.5–5.0)
Alkaline Phosphatase: 124 U/L (ref 38–126)
Anion gap: 13 (ref 5–15)
BUN: 20 mg/dL (ref 6–20)
CO2: 25 mmol/L (ref 22–32)
Calcium: 9.8 mg/dL (ref 8.9–10.3)
Chloride: 100 mmol/L — ABNORMAL LOW (ref 101–111)
Creatinine, Ser: 0.89 mg/dL (ref 0.44–1.00)
GFR calc Af Amer: >60 mL/min (ref >60)
GFR calc non Af Amer: 59 mL/min — ABNORMAL LOW (ref >60)
Glucose, Bld: 109 mg/dL — ABNORMAL HIGH (ref 65–99)
Potassium: 3.8 mmol/L (ref 3.5–5.1)
Sodium: 138 mmol/L (ref 135–145)
Total Bilirubin: 0.7 mg/dL (ref 0.3–1.2)
Total Protein: 7.4 g/dL (ref 6.5–8.1)

## 2017-06-29 MED ORDER — IOPAMIDOL (ISOVUE-370) INJECTION 76%
80.0000 mL | Freq: Once | INTRAVENOUS | Status: AC | PRN
  Administered 2017-06-29: 80 mL via INTRAVENOUS

## 2017-06-29 MED ORDER — DIAZEPAM 5 MG PO TABS: 2.5000 mg | ORAL_TABLET | Freq: Three times a day (TID) | ORAL | 0 refills | Status: DC | PRN

## 2017-06-29 MED ORDER — LORAZEPAM 0.5 MG PO TABS
0.5000 mg | ORAL_TABLET | Freq: Once | ORAL | Status: AC
  Administered 2017-06-29: 0.5 mg via ORAL
  Filled 2017-06-29: qty 1

## 2017-06-29 MED ORDER — HYDROMORPHONE HCL 1 MG/ML IJ SOLN
0.5000 mg | Freq: Once | INTRAMUSCULAR | Status: AC
  Administered 2017-06-29: 0.5 mg via INTRAVENOUS
  Filled 2017-06-29: qty 1

## 2017-06-29 MED ORDER — LORAZEPAM 2 MG/ML IJ SOLN
0.5000 mg | Freq: Once | INTRAMUSCULAR | Status: AC
  Administered 2017-06-29: 0.5 mg via INTRAVENOUS
  Filled 2017-06-29: qty 1

## 2017-06-29 NOTE — ED Notes (Signed)
Patient walked to restroom with standby assistance. Patient states that her back is still hurting at this time.

## 2017-06-29 NOTE — ED Notes (Signed)
Pt states her right side of her face and her neck doesn't feel right.  Also c/o feeling like everything is spinning.

## 2017-06-29 NOTE — ED Triage Notes (Signed)
Pt here for xrays of back.  Pt was leaving the hospital and told her  Family member she suddenly felt light headed and had a funny feeling in her head.  Pt says she feels dizzy and thought she was going to pass out.  Pt alert and oriented.   Reports significant back pain.

## 2017-06-29 NOTE — ED Provider Notes (Signed)
[facility] EMERGENCY DEPARTMENT Provider Note   CSN: [id number] Arrival date & time: 06/29/17  1705     History   Chief Complaint Chief Complaint  Patient presents with  . Dizziness    HPI Lori George is a 84 y.o. female.  HPI   84 year old female presenting with her daughter for evaluation of dizziness and what sounds like near syncope.  Patient was leaving after having plain films of her back.  She was going out to her vehicle when she had an odd sensation in the right side of her neck.  This is associated with dizziness.  She describes a sensation as if she may pass out.  She has had vertigo previously and says it did not feel like this.  No acute visual changes.  No acute numbness, tingling or focal loss of strength.  She denies any neck pain or headache per se, but rather it just does not feel quite right in her right lateral neck.  This feels different than the back pain she has been experiencing recently.  No dyspnea.  No palpitations.  Past Medical History:  Diagnosis Date  . Glaucoma   . Stroke (HCC)   . Thyroid disease     Patient Active Problem List   Diagnosis Date Noted  . Hyperlipidemia 05/17/2016  . GERD (gastroesophageal reflux disease) 05/17/2016  . TIA (transient ischemic attack) 05/15/2016  . Low back pain 05/15/2016  . Hypothyroidism 05/15/2016    Past Surgical History:  Procedure Laterality Date  . BACK SURGERY    . IR FLUORO GUIDED NEEDLE PLC ASPIRATION/INJECTION LOC  06/17/2017  . IR KYPHO EA ADDL LEVEL THORACIC OR LUMBAR  06/17/2017  . IR KYPHO THORACIC WITH BONE BIOPSY  06/17/2017  . IR RADIOLOGIST EVAL & MGMT  06/13/2017     OB History   None      Home Medications    Prior to Admission medications   Medication Sig Start Date End Date Taking? Authorizing Provider  aspirin EC 81 MG tablet Take 81 mg by mouth daily.    [provider]  brimonidine (ALPHAGAN) 0.2 % ophthalmic solution Place 1 drop into both eyes daily. 05/30/14    [provider]  calcium carbonate (TUMS - DOSED IN MG ELEMENTAL CALCIUM) 500 MG chewable tablet Chew 1 tablet by mouth daily as needed for heartburn.     [provider]  CALCIUM PO Take 1 tablet by mouth daily.    [provider]  HYDROcodone-acetaminophen (NORCO/VICODIN) 5-325 MG tablet Take 1 tablet by mouth every 6 (six) hours as needed for moderate pain.    [provider]  levothyroxine (SYNTHROID, LEVOTHROID) 50 MCG tablet Take 50 mcg by mouth daily before breakfast.    [provider]  linaclotide (LINZESS) 72 MCG capsule Take 72 mcg by mouth daily before breakfast.    [provider]  pantoprazole (PROTONIX) 40 MG tablet Take 1 tablet by mouth daily. 06/06/17   [provider]  timolol (TIMOPTIC) 0.5 % ophthalmic solution Place 1 drop into the right eye daily.  04/18/14   [provider]    Family History No family history on file.  Social History Social History   Tobacco Use  . Smoking status: Never Smoker  . Smokeless tobacco: Never Used  Substance Use Topics  . Alcohol use: No  . Drug use: No     Allergies   Patient has no known allergies.   Review of Systems Review of Systems  All  systems reviewed and negative, other than as noted in HPI.  Physical Exam Updated Vital Signs BP (!) 179/86 (BP Location: Left Arm)   Pulse 87   Temp (!) 97.5 F (36.4 C) (Oral)   Resp 18   SpO2 100%   Physical Exam  Constitutional: She appears well-developed and well-nourished. No distress.  HENT:  Head: Normocephalic and atraumatic.  Eyes: Conjunctivae are normal. Right eye exhibits no discharge. Left eye exhibits no discharge.  Neck: Neck supple.  Cardiac is grossly normal to inspection.  Her neck pain is not reproducible with palpation nor ranging it.  Cardiovascular: Normal rate, regular rhythm and normal heart sounds. Exam reveals no gallop and no friction rub.  No murmur heard. Pulmonary/Chest:  Effort normal and breath sounds normal. No respiratory distress.  Abdominal: Soft. She exhibits no distension. There is no tenderness.  Musculoskeletal: She exhibits no edema or tenderness.  Neurological: She is alert.  Right pupil approximately 4 to 5 mm, left pupil 3 mm.  Both are reactive.  Cranial nerves II through XII are otherwise intact.  Speech is clear.  Strength is 5 out of 5 bilateral upper and lower extremities although she complains of increased back pain with flexion of either hip.  Skin: Skin is warm and dry.  Psychiatric: She has a normal mood and affect. Her behavior is normal. Thought content normal.  Nursing note and vitals reviewed.    ED Treatments / Results  Labs (all labs ordered are listed, but only abnormal results are displayed) Labs Reviewed  COMPREHENSIVE METABOLIC PANEL - Abnormal; Notable for the following components:      Result Value   Chloride 100 (*)    Glucose, Bld 109 (*)    ALT 12 (*)    GFR calc non Af Amer 59 (*)    All other components within normal limits  CBC WITH DIFFERENTIAL/PLATELET    EKG EKG Interpretation  Date/Time:  Wednesday Jun 29 2017 17:06:17 EDT Ventricular Rate:  79 PR Interval:  138 QRS Duration: 78 QT Interval:  384 QTC Calculation: 440 R Axis:   67 Text Interpretation:  Normal sinus rhythm Normal ECG since last tracing no significant change Confirmed by Rolan Bucco [phone]) on 06/30/2017 9:00:51 AM   Radiology No results found.   Ct Angio Head W Or Wo Contrast  Result Date: 06/29/2017 CLINICAL DATA:  Dizziness and presyncope. RIGHT head and neck abnormality. Headache. History of stroke. EXAM: CT ANGIOGRAPHY HEAD AND NECK TECHNIQUE: Multidetector CT imaging of the head and neck was performed using the standard protocol during bolus administration of intravenous contrast. Multiplanar CT image reconstructions and MIPs were obtained to evaluate the vascular anatomy. Carotid stenosis measurements (when applicable) are  obtained utilizing NASCET criteria, using the distal internal carotid diameter as the denominator. CONTRAST:  80mL ISOVUE-370 IOPAMIDOL (ISOVUE-370) INJECTION 76% COMPARISON:  MRI thoracic spine June 09, 2017 and MRI head May 16, 2016 FINDINGS: CT HEAD FINDINGS BRAIN: No intraparenchymal hemorrhage, mass effect nor midline shift. The ventricles and sulci are normal for age. Patchy supratentorial and pontine white matter hypodensities less than expected for patient's age, though non-specific are most compatible with chronic small vessel ischemic disease. No acute large vascular territory infarcts. No abnormal extra-axial fluid collections. Basal cisterns are patent. VASCULAR: Minimal calcific atherosclerosis of the carotid siphons. SKULL: No skull fracture. No significant scalp soft tissue swelling. SINUSES/ORBITS: The mastoid air-cells and included paranasal sinuses are well-aerated.The included ocular globes and orbital contents are non-suspicious. OTHER: Patient is edentulous. CTA  NECK FINDINGS: AORTIC ARCH: Normal appearance of the thoracic arch, normal branch pattern. The origins of the innominate, left Common carotid artery and subclavian artery are widely patent. RIGHT CAROTID SYSTEM: Common carotid artery is patent. Normal appearance of the carotid bifurcation without hemodynamically significant stenosis by NASCET criteria. Normal appearance of the internal carotid artery. LEFT CAROTID SYSTEM: Common carotid artery is patent. Normal appearance of the carotid bifurcation without hemodynamically significant stenosis by NASCET criteria. Normal appearance of the internal carotid artery. VERTEBRAL ARTERIES:RIGHT vertebral artery is dominant. Tortuous origin, widely patent. Extremely diminutive LEFT vertebral artery with compensatory small LEFT foramen transversarium. Nonvisualized distal LEFT V2 segment, reconstituted V3 segment. SKELETON: No acute osseous process though bone windows have not been submitted.  Osteopenia. OTHER NECK: Nonacute. 10 mm cyst interposed between RIGHT epiglottis and RIGHT vallecular. UPPER CHEST: Included lung apices are clear. No superior mediastinal lymphadenopathy. CTA HEAD FINDINGS: ANTERIOR CIRCULATION: Patent cervical internal carotid arteries, petrous, cavernous and supra clinoid internal carotid arteries. Patent anterior communicating artery. Patent anterior and middle cerebral arteries. Fenestrated RIGHT A2 origin. No large vessel occlusion, significant stenosis, contrast extravasation or aneurysm. POSTERIOR CIRCULATION: Patent vertebral arteries, vertebrobasilar junction and basilar artery, as well as main branch vessels. Patent posterior cerebral arteries. Bilateral posterior communicating arteries present. No large vessel occlusion, significant stenosis, contrast extravasation or aneurysm. VENOUS SINUSES: Major dural venous sinuses are patent though not tailored for evaluation on this angiographic examination. ANATOMIC VARIANTS: None. DELAYED PHASE: No abnormal intracranial enhancement. MIP images reviewed. IMPRESSION: CT HEAD: 1. Normal CT HEAD with and without contrast for age. CTA NECK: 1. No hemodynamically significant stenosis internal carotid artery's. 2. Hypoplastic LEFT V1 segment, nonvisualized distal V2 segment may be developmental or, chronically occluded. V3 reconstitution. CTA HEAD: 1. Normal CTA HEAD.  Complete circle-of-Willis. Electronically Signed   By: Awilda Metro M.D.   On: 06/29/2017 18:18   Dg Cervical Spine Complete  Result Date: 06/30/2017 CLINICAL DATA:  Severe back pain greatest in the midthoracic region for the past 8 weeks. EXAM: CERVICAL SPINE - COMPLETE 4+ VIEW COMPARISON:  None in PACs FINDINGS: The cervical vertebral bodies are preserved in height. The disc space heights are well maintained. There is no perched facet or spinous process fracture. There is mild bony encroachment upon the neural foramina on the right in the lower cervical spine.  The prevertebral soft tissue spaces are normal. The odontoid is intact. IMPRESSION: There is no compression fracture nor other acute bony abnormality of the cervical spine. There is mild bony encroachment upon the lower neural foramina on the right. Electronically Signed   By: David  [place] M.D.   On: 06/30/2017 08:50   Dg Thoracic Spine W/swimmers  Result Date: 06/30/2017 CLINICAL DATA:  Generalized back pain greatest in the midthoracic region for the past 8 weeks. The patient underwent kyphoplasty thirteen days ago. EXAM: THORACIC SPINE - 3 VIEWS COMPARISON:  Thoracic spine series dated June 06, 2017 FINDINGS: The patient has undergone kyphoplasty at approximately T7 and T8. There is subjective mild loss of height of T10 more conspicuous today. T11 and T12 are preserved in height. The levels above the kyphoplasty are normal where visualized. The patient has undergone previous L2 kyphoplasty. IMPRESSION: No postprocedure complication observed following kyphoplasty at T7 and T8. There is mild loss of height of the body of T10 slightly more conspicuous today. Electronically Signed   By: David  [place] M.D.   On: 06/30/2017 08:53   Dg Thoracic Spine W/swimmers  Result Date: 06/06/2017 CLINICAL DATA:  Mid back pain for the past month.  No known injury. EXAM: THORACIC SPINE - 3 VIEWS COMPARISON:  Coronal and sagittal reconstructed images through the thoracic spine from a chest CT scan of May 05, 2017 FINDINGS: The thoracic vertebral bodies are preserved in height. There is new compression of T7 with loss of height anteriorly of 60% and posteriorly of 10%. There is mild multilevel degenerative disc space narrowing. The cervicothoracic and thoracolumbar junctions are grossly normal. The patient has undergone previous kyphoplasty at L2. IMPRESSION: New wedge compression of the body of T7 since the CT scan of May 05, 2017. Electronically Signed   By: David  [place] M.D.   On: 06/06/2017 17:06   Dg Lumbar Spine  Complete  Result Date: 06/30/2017 CLINICAL DATA:  Severe back pain. EXAM: LUMBAR SPINE - COMPLETE 4+ VIEW COMPARISON:  CT 09/30/2015. FINDINGS: Lumbar spine numbered as per prior CT. Thoracolumbar spine osteopenia and degenerative change. Mild scoliosis. L1 vertebroplasty again noted. L4 and L5 vertebroplasties also noted on today's exam. The inferior endplate of L4 and superior endplate of L5 are not well visualized. This may be secondary to osteopenia. Diskitis cannot be excluded. MRI may prove useful for further evaluation. IMPRESSION: 1. L1 vertebroplasty again noted. L4-L5 vertebroplasties noted on today's exam. Diffuse osteopenia and degenerative change. Mild scoliosis. 2. The inferior endplate of L4 and superior endplate of L5 poorly visualized. Although this may be secondary to osteopenia diskitis cannot be excluded. Gadolinium-enhanced lumbar MRI may prove useful for further evaluation. Electronically Signed   By: Maisie Fus  Register   On: 06/30/2017 08:52   Ct Angio Neck W And/or Wo Contrast  Result Date: 06/29/2017 CLINICAL DATA:  Dizziness and presyncope. RIGHT head and neck abnormality. Headache. History of stroke. EXAM: CT ANGIOGRAPHY HEAD AND NECK TECHNIQUE: Multidetector CT imaging of the head and neck was performed using the standard protocol during bolus administration of intravenous contrast. Multiplanar CT image reconstructions and MIPs were obtained to evaluate the vascular anatomy. Carotid stenosis measurements (when applicable) are obtained utilizing NASCET criteria, using the distal internal carotid diameter as the denominator. CONTRAST:  80mL ISOVUE-370 IOPAMIDOL (ISOVUE-370) INJECTION 76% COMPARISON:  MRI thoracic spine June 09, 2017 and MRI head May 16, 2016 FINDINGS: CT HEAD FINDINGS BRAIN: No intraparenchymal hemorrhage, mass effect nor midline shift. The ventricles and sulci are normal for age. Patchy supratentorial and pontine white matter hypodensities less than expected for  patient's age, though non-specific are most compatible with chronic small vessel ischemic disease. No acute large vascular territory infarcts. No abnormal extra-axial fluid collections. Basal cisterns are patent. VASCULAR: Minimal calcific atherosclerosis of the carotid siphons. SKULL: No skull fracture. No significant scalp soft tissue swelling. SINUSES/ORBITS: The mastoid air-cells and included paranasal sinuses are well-aerated.The included ocular globes and orbital contents are non-suspicious. OTHER: Patient is edentulous. CTA NECK FINDINGS: AORTIC ARCH: Normal appearance of the thoracic arch, normal branch pattern. The origins of the innominate, left Common carotid artery and subclavian artery are widely patent. RIGHT CAROTID SYSTEM: Common carotid artery is patent. Normal appearance of the carotid bifurcation without hemodynamically significant stenosis by NASCET criteria. Normal appearance of the internal carotid artery. LEFT CAROTID SYSTEM: Common carotid artery is patent. Normal appearance of the carotid bifurcation without hemodynamically significant stenosis by NASCET criteria. Normal appearance of the internal carotid artery. VERTEBRAL ARTERIES:RIGHT vertebral artery is dominant. Tortuous origin, widely patent. Extremely diminutive LEFT vertebral artery with compensatory small LEFT foramen transversarium. Nonvisualized distal LEFT V2 segment, reconstituted V3 segment. SKELETON: No acute osseous process  though bone windows have not been submitted. Osteopenia. OTHER NECK: Nonacute. 10 mm cyst interposed between RIGHT epiglottis and RIGHT vallecular. UPPER CHEST: Included lung apices are clear. No superior mediastinal lymphadenopathy. CTA HEAD FINDINGS: ANTERIOR CIRCULATION: Patent cervical internal carotid arteries, petrous, cavernous and supra clinoid internal carotid arteries. Patent anterior communicating artery. Patent anterior and middle cerebral arteries. Fenestrated RIGHT A2 origin. No large vessel  occlusion, significant stenosis, contrast extravasation or aneurysm. POSTERIOR CIRCULATION: Patent vertebral arteries, vertebrobasilar junction and basilar artery, as well as main branch vessels. Patent posterior cerebral arteries. Bilateral posterior communicating arteries present. No large vessel occlusion, significant stenosis, contrast extravasation or aneurysm. VENOUS SINUSES: Major dural venous sinuses are patent though not tailored for evaluation on this angiographic examination. ANATOMIC VARIANTS: None. DELAYED PHASE: No abnormal intracranial enhancement. MIP images reviewed. IMPRESSION: CT HEAD: 1. Normal CT HEAD with and without contrast for age. CTA NECK: 1. No hemodynamically significant stenosis internal carotid artery's. 2. Hypoplastic LEFT V1 segment, nonvisualized distal V2 segment may be developmental or, chronically occluded. V3 reconstitution. CTA HEAD: 1. Normal CTA HEAD.  Complete circle-of-Willis. Electronically Signed   By: Awilda Metro M.D.   On: 06/29/2017 18:18   Mr Thoracic Spine Wo Contrast  Result Date: 06/09/2017 CLINICAL DATA:  Lumbar back pain 2 months. No known injury. T7 compression fracture. EXAM: MRI THORACIC SPINE WITHOUT CONTRAST TECHNIQUE: Multiplanar, multisequence MR imaging of the thoracic spine was performed. No intravenous contrast was administered. COMPARISON:  Thoracic spine radiographs 06/06/2017. CT chest 05/05/2017. FINDINGS: Alignment:  AP alignment is anatomic. Vertebrae: There is diffuse edema throughout the T7 vertebral body compatible with an acute/subacute inferior endplate compression fracture. There is also diffuse edema involving the T8 vertebral body with some loss of height superiorly. The STIR sequence demonstrates at least 3 rounded lesions in the thoracic spine. A 9 mm lesion is present posteriorly and to the right at T6. A 10 mm lesion involves the superior endplate of T10 on the left. A 12 mm lesion is noted anteriorly at L1, just to the left  of midline. Indistinct T2 signal is noted superiorly at T2. Vertebral body heights are maintained. Vertebral augmentation is noted at L2 with the remote fracture. This appears healed. Cord:  Normal signal is present to the conus medullaris at L2. Paraspinal and other soft tissues: Small bilateral pleural effusions are worse on the right. Visualized lung fields are otherwise clear. Limited imaging of the abdomen is unremarkable. Paraspinous musculature is within normal limits for age. Disc levels: No significant focal disc protrusion or central canal stenosis is present. The foramina are patent bilaterally. IMPRESSION: 1. Edema throughout the vertebral bodies at T7 and T8 compatible with acute/subacute compression fractures. Given disease elsewhere, these may represent pathologic fractures. 2. Rounded T2 hyperintensities at T6, T10, and L1 as described raise concern for metastatic disease to the thoracic spine. Biopsy or postcontrast imaging could be used for further evaluation. 3. Exaggerated thoracic kyphosis without focal disc protrusion or stenosis. 4. Spinal augmentation at L2 associated with a remote fracture. Electronically Signed   By: Marin Roberts M.D.   On: 06/09/2017 14:09   Ir Fluoro Guide Ndl Plmt / Bx  Result Date: 06/20/2017 INDICATION: Severe lower thoracic pain secondary to compression fracture of T7-T8. Abnormal lesions in the thoracic spine. T10 biopsy. EXAM: BALLOON KYPHOPLASTY AT T7, T8 WITH BIOPSIES, AND CORE BIOPSY AT T10 COMPARISON:  MRI scan of the thoracolumbar spine of 06/09/2017. MEDICATIONS: As antibiotic prophylaxis, Ancef 2 g IV ordered pre-procedure and administered  intravenously within 1 hour of incision. ANESTHESIA/SEDATION: Moderate (conscious) sedation was employed during this procedure. A total of Versed 2 mg and Fentanyl 75 mcg was administered intravenously. Moderate Sedation Time: 49 minutes. The patient's level of consciousness and vital signs were monitored  continuously by radiology nursing throughout the procedure under my direct supervision. FLUOROSCOPY TIME:  Fluoroscopy Time: 18 minutes 48 seconds (1038 mGy) COMPLICATIONS: None immediate. PROCEDURE: Following a full explanation of the procedure along with the potential associated complications, an informed witnessed consent was obtained. The patient was placed prone on the fluoroscopic table. The skin overlying the thoracic region was then prepped and draped in the usual sterile fashion. Both pedicles at T7, and the left pedicle at T8 were then infiltrated with 0.25% bupivacaine followed by the advancement of an 11-gauge Jamshidi needle through each of the pedicles into the posterior one-third at T7 and T8. These were then exchanged for a Kyphon advanced osteo introducer system comprised of a working cannula and a Kyphon osteo drill at both levels. These combinations were then advanced over a Kyphon osteo bone pin until the tips of the Kyphon osteo drill was in the posterior third at T7 and T8. At this time, the bone pins were removed. In a medial trajectory, the combinations were advanced until the tip of the working cannula was inside the posterior one-third at T7 and T8. The osteo drills were removed and a core sample sent for pathologic analysis obtained from each of the needles. Through each working cannula, a Kyphon bone biopsy device was advanced to within 5 mm of the anterior aspect of T7 and T8. Core samples from these were also sent for pathologic analysis. Through the working cannulae, a Kyphon inflatable bone tamp 20 x 3 were advanced and positioned with the distal marker 5 mm from the anterior aspect of T7 and T8. Crossing of the midline was seen on the AP projection. At this time, the balloons were expanded using contrast via a Kyphon inflation syringe device via microtubing at both levels. Inflations were continued until there was apposition with the superior and the inferior endplates. At this time,  methylmethacrylate mixture was reconstituted with Tobramycin in the Kyphon bone mixing device system. This was then loaded onto the Kyphon bone fillers. The balloons were deflated and removed followed by the instillation of 2-1/2 bone filler equivalents of methylmethacrylate mixture at T7, and 3 bone filler equivalents of methylmethacrylate mixture at T7 and T8 with excellent filling in the AP and lateral projections. No extravasation was noted in the disk spaces or posteriorly into the spinal canal. No epidural venous contamination was seen. The working cannulae and the bone fillers were then retrieved and removed. Hemostasis was achieved at the skin entry sites. The right pedicle at T10 was then identified and infiltrated with 0.25% bupivacaine. Again access was obtained into the T10 vertebral body through the right pedicle with an 11 gauge Jamshidi needle followed by the advancement of Kyphon advanced osteo introducer system comprised of working cannula and the Kyphon osteo drill into the posterior 1/3 at T10 over a Kyphon osteo bone pin. The combination was advanced until the Kyphon osteo drill was in the posterior 1/3 at T10. At this time the bone pin was removed In a medial trajectory the combination was then advanced with the osteo drill until the cannula was in the posterior 1/3 at T10. At this time the osteo drill was removed. Core samples were then sent from the osteo drill, and also from the  Kyphon bone biopsy device for analysis. The working cannula was then removed with hemostasis achieved at the skin entry site. The patient tolerated the procedure well. There were no acute complications. IMPRESSION: 1. Status post fluoroscopic-guided needle placement for deep core bone biopsy at T7, T8 and T10. 2. Status post vertebral body augmentation using balloon kyphoplasty at T7 and T8 as described without event. 3. The patient and her sons were informed to contact Dr Juanetta Gosling, the referring doctor's office, for  the results of the biopsies. Electronically Signed   By: Julieanne Cotton M.D.   On: 06/17/2017 13:18   Ir Kypho Thoracic With Bone Biopsy  Result Date: 06/20/2017 INDICATION: Severe lower thoracic pain secondary to compression fracture of T7-T8. Abnormal lesions in the thoracic spine. T10 biopsy. EXAM: BALLOON KYPHOPLASTY AT T7, T8 WITH BIOPSIES, AND CORE BIOPSY AT T10 COMPARISON:  MRI scan of the thoracolumbar spine of 06/09/2017. MEDICATIONS: As antibiotic prophylaxis, Ancef 2 g IV ordered pre-procedure and administered intravenously within 1 hour of incision. ANESTHESIA/SEDATION: Moderate (conscious) sedation was employed during this procedure. A total of Versed 2 mg and Fentanyl 75 mcg was administered intravenously. Moderate Sedation Time: 49 minutes. The patient's level of consciousness and vital signs were monitored continuously by radiology nursing throughout the procedure under my direct supervision. FLUOROSCOPY TIME:  Fluoroscopy Time: 18 minutes 48 seconds (1038 mGy) COMPLICATIONS: None immediate. PROCEDURE: Following a full explanation of the procedure along with the potential associated complications, an informed witnessed consent was obtained. The patient was placed prone on the fluoroscopic table. The skin overlying the thoracic region was then prepped and draped in the usual sterile fashion. Both pedicles at T7, and the left pedicle at T8 were then infiltrated with 0.25% bupivacaine followed by the advancement of an 11-gauge Jamshidi needle through each of the pedicles into the posterior one-third at T7 and T8. These were then exchanged for a Kyphon advanced osteo introducer system comprised of a working cannula and a Kyphon osteo drill at both levels. These combinations were then advanced over a Kyphon osteo bone pin until the tips of the Kyphon osteo drill was in the posterior third at T7 and T8. At this time, the bone pins were removed. In a medial trajectory, the combinations were advanced  until the tip of the working cannula was inside the posterior one-third at T7 and T8. The osteo drills were removed and a core sample sent for pathologic analysis obtained from each of the needles. Through each working cannula, a Kyphon bone biopsy device was advanced to within 5 mm of the anterior aspect of T7 and T8. Core samples from these were also sent for pathologic analysis. Through the working cannulae, a Kyphon inflatable bone tamp 20 x 3 were advanced and positioned with the distal marker 5 mm from the anterior aspect of T7 and T8. Crossing of the midline was seen on the AP projection. At this time, the balloons were expanded using contrast via a Kyphon inflation syringe device via microtubing at both levels. Inflations were continued until there was apposition with the superior and the inferior endplates. At this time, methylmethacrylate mixture was reconstituted with Tobramycin in the Kyphon bone mixing device system. This was then loaded onto the Kyphon bone fillers. The balloons were deflated and removed followed by the instillation of 2-1/2 bone filler equivalents of methylmethacrylate mixture at T7, and 3 bone filler equivalents of methylmethacrylate mixture at T7 and T8 with excellent filling in the AP and lateral projections. No extravasation was noted  in the disk spaces or posteriorly into the spinal canal. No epidural venous contamination was seen. The working cannulae and the bone fillers were then retrieved and removed. Hemostasis was achieved at the skin entry sites. The right pedicle at T10 was then identified and infiltrated with 0.25% bupivacaine. Again access was obtained into the T10 vertebral body through the right pedicle with an 11 gauge Jamshidi needle followed by the advancement of Kyphon advanced osteo introducer system comprised of working cannula and the Kyphon osteo drill into the posterior 1/3 at T10 over a Kyphon osteo bone pin. The combination was advanced until the Kyphon  osteo drill was in the posterior 1/3 at T10. At this time the bone pin was removed In a medial trajectory the combination was then advanced with the osteo drill until the cannula was in the posterior 1/3 at T10. At this time the osteo drill was removed. Core samples were then sent from the osteo drill, and also from the Kyphon bone biopsy device for analysis. The working cannula was then removed with hemostasis achieved at the skin entry site. The patient tolerated the procedure well. There were no acute complications. IMPRESSION: 1. Status post fluoroscopic-guided needle placement for deep core bone biopsy at T7, T8 and T10. 2. Status post vertebral body augmentation using balloon kyphoplasty at T7 and T8 as described without event. 3. The patient and her sons were informed to contact Dr Juanetta Gosling, the referring doctor's office, for the results of the biopsies. Electronically Signed   By: Julieanne Cotton M.D.   On: 06/17/2017 13:18   Ir Kypho Ea Addl Level Thoracic Or Lumbar  Result Date: 06/20/2017 INDICATION: Severe lower thoracic pain secondary to compression fracture of T7-T8. Abnormal lesions in the thoracic spine. T10 biopsy. EXAM: BALLOON KYPHOPLASTY AT T7, T8 WITH BIOPSIES, AND CORE BIOPSY AT T10 COMPARISON:  MRI scan of the thoracolumbar spine of 06/09/2017. MEDICATIONS: As antibiotic prophylaxis, Ancef 2 g IV ordered pre-procedure and administered intravenously within 1 hour of incision. ANESTHESIA/SEDATION: Moderate (conscious) sedation was employed during this procedure. A total of Versed 2 mg and Fentanyl 75 mcg was administered intravenously. Moderate Sedation Time: 49 minutes. The patient's level of consciousness and vital signs were monitored continuously by radiology nursing throughout the procedure under my direct supervision. FLUOROSCOPY TIME:  Fluoroscopy Time: 18 minutes 48 seconds (1038 mGy) COMPLICATIONS: None immediate. PROCEDURE: Following a full explanation of the procedure along with  the potential associated complications, an informed witnessed consent was obtained. The patient was placed prone on the fluoroscopic table. The skin overlying the thoracic region was then prepped and draped in the usual sterile fashion. Both pedicles at T7, and the left pedicle at T8 were then infiltrated with 0.25% bupivacaine followed by the advancement of an 11-gauge Jamshidi needle through each of the pedicles into the posterior one-third at T7 and T8. These were then exchanged for a Kyphon advanced osteo introducer system comprised of a working cannula and a Kyphon osteo drill at both levels. These combinations were then advanced over a Kyphon osteo bone pin until the tips of the Kyphon osteo drill was in the posterior third at T7 and T8. At this time, the bone pins were removed. In a medial trajectory, the combinations were advanced until the tip of the working cannula was inside the posterior one-third at T7 and T8. The osteo drills were removed and a core sample sent for pathologic analysis obtained from each of the needles. Through each working cannula, a Kyphon bone biopsy  device was advanced to within 5 mm of the anterior aspect of T7 and T8. Core samples from these were also sent for pathologic analysis. Through the working cannulae, a Kyphon inflatable bone tamp 20 x 3 were advanced and positioned with the distal marker 5 mm from the anterior aspect of T7 and T8. Crossing of the midline was seen on the AP projection. At this time, the balloons were expanded using contrast via a Kyphon inflation syringe device via microtubing at both levels. Inflations were continued until there was apposition with the superior and the inferior endplates. At this time, methylmethacrylate mixture was reconstituted with Tobramycin in the Kyphon bone mixing device system. This was then loaded onto the Kyphon bone fillers. The balloons were deflated and removed followed by the instillation of 2-1/2 bone filler equivalents of  methylmethacrylate mixture at T7, and 3 bone filler equivalents of methylmethacrylate mixture at T7 and T8 with excellent filling in the AP and lateral projections. No extravasation was noted in the disk spaces or posteriorly into the spinal canal. No epidural venous contamination was seen. The working cannulae and the bone fillers were then retrieved and removed. Hemostasis was achieved at the skin entry sites. The right pedicle at T10 was then identified and infiltrated with 0.25% bupivacaine. Again access was obtained into the T10 vertebral body through the right pedicle with an 11 gauge Jamshidi needle followed by the advancement of Kyphon advanced osteo introducer system comprised of working cannula and the Kyphon osteo drill into the posterior 1/3 at T10 over a Kyphon osteo bone pin. The combination was advanced until the Kyphon osteo drill was in the posterior 1/3 at T10. At this time the bone pin was removed In a medial trajectory the combination was then advanced with the osteo drill until the cannula was in the posterior 1/3 at T10. At this time the osteo drill was removed. Core samples were then sent from the osteo drill, and also from the Kyphon bone biopsy device for analysis. The working cannula was then removed with hemostasis achieved at the skin entry site. The patient tolerated the procedure well. There were no acute complications. IMPRESSION: 1. Status post fluoroscopic-guided needle placement for deep core bone biopsy at T7, T8 and T10. 2. Status post vertebral body augmentation using balloon kyphoplasty at T7 and T8 as described without event. 3. The patient and her sons were informed to contact Dr Juanetta Gosling, the referring doctor's office, for the results of the biopsies. Electronically Signed   By: Julieanne Cotton M.D.   On: 06/17/2017 13:18   Ir Radiologist Eval & Mgmt  Result Date: 06/23/2017 Please refer to notes tab for details about interventional procedure. (Op  Note)   Procedures Procedures (including critical care time)  Medications Ordered in ED Medications  LORazepam (ATIVAN) injection 0.5 mg (has no administration in time range)     Initial Impression / Assessment and Plan / ED Course  I have reviewed the triage vital signs and the nursing notes.  Pertinent labs & imaging results that were available during my care of the patient were reviewed by me and considered in my medical decision making (see chart for details).     84 year old female with what sounds like a possible near syncopal event.  Now much improved.  Precipitated by severe pain?  CT angiography head and neck unremarkable.  Questionable anisocoria on her initial exam.  If real, this is resolved and her exam is otherwise nonfocal.  I am unsure as to the  exact etiology of her increased back pain after she had been improving after her recent kyphoplasty.  No weight she describes that it sounds like she may be having muscle spasms.  I reviewed her imaging from earlier today.  I do not see any significant changes on her plain films that were not present previously.  We will add Valium as muscle relaxant.  She has prescribed pain medicine.  Return precautions were discussed.  Outpatient follow-up otherwise.  Final Clinical Impressions(s) / ED Diagnoses   Final diagnoses:  Midline back pain, unspecified back location, unspecified chronicity  Dizziness    ED Discharge Orders    None       Raeford Razor, MD 07/01/17 [phone]

## 2017-07-04 ENCOUNTER — Other Ambulatory Visit (HOSPITAL_COMMUNITY): Payer: Self-pay | Admitting: Pulmonary Disease

## 2017-07-04 ENCOUNTER — Telehealth (HOSPITAL_COMMUNITY): Payer: Self-pay

## 2017-07-04 DIAGNOSIS — M4850XA Collapsed vertebra, not elsewhere classified, site unspecified, initial encounter for fracture: Secondary | ICD-10-CM

## 2017-07-04 NOTE — Telephone Encounter (Signed)
Pt's daughter called stating pt still having back pain. Called to schedule f/u, no answer, left vm. AW

## 2017-07-07 ENCOUNTER — Ambulatory Visit (HOSPITAL_COMMUNITY)
Admission: RE | Admit: 2017-07-07 | Discharge: 2017-07-07 | Disposition: A | Discharge status: Home / Self Care | Payer: Medicare Other | Source: Ambulatory Visit | Attending: Pulmonary Disease | Admitting: Pulmonary Disease

## 2017-07-07 DIAGNOSIS — S22079A Unspecified fracture of T9-T10 vertebra, initial encounter for closed fracture: Secondary | ICD-10-CM | POA: Diagnosis not present

## 2017-07-07 DIAGNOSIS — M4850XA Collapsed vertebra, not elsewhere classified, site unspecified, initial encounter for fracture: Secondary | ICD-10-CM | POA: Insufficient documentation

## 2017-07-12 DIAGNOSIS — M545 Low back pain: Secondary | ICD-10-CM | POA: Diagnosis not present

## 2017-07-12 DIAGNOSIS — M4856XG Collapsed vertebra, not elsewhere classified, lumbar region, subsequent encounter for fracture with delayed healing: Secondary | ICD-10-CM | POA: Diagnosis not present

## 2017-07-12 DIAGNOSIS — Z7901 Long term (current) use of anticoagulants: Secondary | ICD-10-CM | POA: Diagnosis not present

## 2017-07-19 DIAGNOSIS — M4856XG Collapsed vertebra, not elsewhere classified, lumbar region, subsequent encounter for fracture with delayed healing: Secondary | ICD-10-CM | POA: Diagnosis not present

## 2017-07-21 DIAGNOSIS — M4854XG Collapsed vertebra, not elsewhere classified, thoracic region, subsequent encounter for fracture with delayed healing: Secondary | ICD-10-CM | POA: Diagnosis not present

## 2017-07-28 DIAGNOSIS — M810 Age-related osteoporosis without current pathological fracture: Secondary | ICD-10-CM | POA: Diagnosis not present

## 2017-07-28 DIAGNOSIS — M4856XG Collapsed vertebra, not elsewhere classified, lumbar region, subsequent encounter for fracture with delayed healing: Secondary | ICD-10-CM | POA: Diagnosis not present

## 2017-07-28 DIAGNOSIS — M545 Low back pain: Secondary | ICD-10-CM | POA: Diagnosis not present

## 2017-07-28 DIAGNOSIS — M4854XD Collapsed vertebra, not elsewhere classified, thoracic region, subsequent encounter for fracture with routine healing: Secondary | ICD-10-CM | POA: Diagnosis not present

## 2017-07-28 DIAGNOSIS — M4854XA Collapsed vertebra, not elsewhere classified, thoracic region, initial encounter for fracture: Secondary | ICD-10-CM | POA: Diagnosis not present

## 2017-07-28 DIAGNOSIS — S22089A Unspecified fracture of T11-T12 vertebra, initial encounter for closed fracture: Secondary | ICD-10-CM | POA: Diagnosis not present

## 2017-07-28 DIAGNOSIS — Z9889 Other specified postprocedural states: Secondary | ICD-10-CM | POA: Diagnosis not present

## 2017-07-28 DIAGNOSIS — M4854XG Collapsed vertebra, not elsewhere classified, thoracic region, subsequent encounter for fracture with delayed healing: Secondary | ICD-10-CM | POA: Diagnosis not present

## 2017-07-28 DIAGNOSIS — M4856XA Collapsed vertebra, not elsewhere classified, lumbar region, initial encounter for fracture: Secondary | ICD-10-CM | POA: Diagnosis not present

## 2017-08-04 DIAGNOSIS — M4854XG Collapsed vertebra, not elsewhere classified, thoracic region, subsequent encounter for fracture with delayed healing: Secondary | ICD-10-CM | POA: Diagnosis not present

## 2017-08-17 DIAGNOSIS — M545 Low back pain: Secondary | ICD-10-CM | POA: Diagnosis not present

## 2017-08-17 DIAGNOSIS — M4854XG Collapsed vertebra, not elsewhere classified, thoracic region, subsequent encounter for fracture with delayed healing: Secondary | ICD-10-CM | POA: Diagnosis not present

## 2017-08-23 DIAGNOSIS — M4854XG Collapsed vertebra, not elsewhere classified, thoracic region, subsequent encounter for fracture with delayed healing: Secondary | ICD-10-CM | POA: Diagnosis not present

## 2017-08-23 DIAGNOSIS — M545 Low back pain: Secondary | ICD-10-CM | POA: Diagnosis not present

## 2017-08-24 DIAGNOSIS — M4856XA Collapsed vertebra, not elsewhere classified, lumbar region, initial encounter for fracture: Secondary | ICD-10-CM | POA: Diagnosis not present

## 2017-08-24 DIAGNOSIS — M48061 Spinal stenosis, lumbar region without neurogenic claudication: Secondary | ICD-10-CM | POA: Diagnosis not present

## 2017-08-24 DIAGNOSIS — M4854XA Collapsed vertebra, not elsewhere classified, thoracic region, initial encounter for fracture: Secondary | ICD-10-CM | POA: Diagnosis not present

## 2017-08-24 DIAGNOSIS — R600 Localized edema: Secondary | ICD-10-CM | POA: Diagnosis not present

## 2017-08-24 DIAGNOSIS — Z9889 Other specified postprocedural states: Secondary | ICD-10-CM | POA: Diagnosis not present

## 2017-08-24 DIAGNOSIS — M4856XD Collapsed vertebra, not elsewhere classified, lumbar region, subsequent encounter for fracture with routine healing: Secondary | ICD-10-CM | POA: Diagnosis not present

## 2017-08-24 DIAGNOSIS — M5126 Other intervertebral disc displacement, lumbar region: Secondary | ICD-10-CM | POA: Diagnosis not present

## 2017-08-24 DIAGNOSIS — M4854XD Collapsed vertebra, not elsewhere classified, thoracic region, subsequent encounter for fracture with routine healing: Secondary | ICD-10-CM | POA: Diagnosis not present

## 2017-08-24 DIAGNOSIS — M546 Pain in thoracic spine: Secondary | ICD-10-CM | POA: Diagnosis not present

## 2017-08-26 DIAGNOSIS — M545 Low back pain: Secondary | ICD-10-CM | POA: Diagnosis not present

## 2017-08-29 DIAGNOSIS — M4856XG Collapsed vertebra, not elsewhere classified, lumbar region, subsequent encounter for fracture with delayed healing: Secondary | ICD-10-CM | POA: Diagnosis not present

## 2017-09-09 DIAGNOSIS — M4856XG Collapsed vertebra, not elsewhere classified, lumbar region, subsequent encounter for fracture with delayed healing: Secondary | ICD-10-CM | POA: Diagnosis not present

## 2017-09-09 DIAGNOSIS — M545 Low back pain: Secondary | ICD-10-CM | POA: Diagnosis not present

## 2017-09-19 DIAGNOSIS — M4854XG Collapsed vertebra, not elsewhere classified, thoracic region, subsequent encounter for fracture with delayed healing: Secondary | ICD-10-CM | POA: Diagnosis not present

## 2017-09-19 DIAGNOSIS — M545 Low back pain: Secondary | ICD-10-CM | POA: Diagnosis not present

## 2017-09-19 DIAGNOSIS — M4856XG Collapsed vertebra, not elsewhere classified, lumbar region, subsequent encounter for fracture with delayed healing: Secondary | ICD-10-CM | POA: Diagnosis not present

## 2017-10-05 DIAGNOSIS — Q7649 Other congenital malformations of spine, not associated with scoliosis: Secondary | ICD-10-CM | POA: Diagnosis not present

## 2017-10-05 DIAGNOSIS — Z9889 Other specified postprocedural states: Secondary | ICD-10-CM | POA: Diagnosis not present

## 2017-10-05 DIAGNOSIS — M4856XA Collapsed vertebra, not elsewhere classified, lumbar region, initial encounter for fracture: Secondary | ICD-10-CM | POA: Diagnosis not present

## 2017-10-05 DIAGNOSIS — M4854XA Collapsed vertebra, not elsewhere classified, thoracic region, initial encounter for fracture: Secondary | ICD-10-CM | POA: Diagnosis not present

## 2017-10-05 DIAGNOSIS — Z87311 Personal history of (healed) other pathological fracture: Secondary | ICD-10-CM | POA: Diagnosis not present

## 2017-10-07 DIAGNOSIS — Z79899 Other long term (current) drug therapy: Secondary | ICD-10-CM | POA: Diagnosis not present

## 2017-10-07 DIAGNOSIS — M810 Age-related osteoporosis without current pathological fracture: Secondary | ICD-10-CM | POA: Diagnosis not present

## 2017-10-07 DIAGNOSIS — M130 Polyarthritis, unspecified: Secondary | ICD-10-CM | POA: Diagnosis not present

## 2017-10-07 DIAGNOSIS — E039 Hypothyroidism, unspecified: Secondary | ICD-10-CM | POA: Diagnosis not present

## 2017-10-07 DIAGNOSIS — M79641 Pain in right hand: Secondary | ICD-10-CM | POA: Diagnosis not present

## 2017-10-07 DIAGNOSIS — E559 Vitamin D deficiency, unspecified: Secondary | ICD-10-CM | POA: Diagnosis not present

## 2017-10-07 DIAGNOSIS — M79642 Pain in left hand: Secondary | ICD-10-CM | POA: Diagnosis not present

## 2017-10-11 DIAGNOSIS — M4856XG Collapsed vertebra, not elsewhere classified, lumbar region, subsequent encounter for fracture with delayed healing: Secondary | ICD-10-CM | POA: Diagnosis not present

## 2017-10-11 DIAGNOSIS — M4854XG Collapsed vertebra, not elsewhere classified, thoracic region, subsequent encounter for fracture with delayed healing: Secondary | ICD-10-CM | POA: Diagnosis not present

## 2017-10-21 DIAGNOSIS — M79672 Pain in left foot: Secondary | ICD-10-CM | POA: Diagnosis not present

## 2017-10-21 DIAGNOSIS — M79671 Pain in right foot: Secondary | ICD-10-CM | POA: Diagnosis not present

## 2017-10-21 DIAGNOSIS — Z713 Dietary counseling and surveillance: Secondary | ICD-10-CM | POA: Diagnosis not present

## 2017-10-21 DIAGNOSIS — M810 Age-related osteoporosis without current pathological fracture: Secondary | ICD-10-CM | POA: Diagnosis not present

## 2017-10-21 DIAGNOSIS — M130 Polyarthritis, unspecified: Secondary | ICD-10-CM | POA: Diagnosis not present

## 2017-10-24 DIAGNOSIS — S32028D Other fracture of second lumbar vertebra, subsequent encounter for fracture with routine healing: Secondary | ICD-10-CM | POA: Diagnosis not present

## 2017-10-24 DIAGNOSIS — M545 Low back pain: Secondary | ICD-10-CM | POA: Diagnosis not present

## 2017-10-24 DIAGNOSIS — M130 Polyarthritis, unspecified: Secondary | ICD-10-CM | POA: Diagnosis not present

## 2017-11-11 ENCOUNTER — Other Ambulatory Visit (HOSPITAL_COMMUNITY): Payer: Self-pay | Admitting: Pulmonary Disease

## 2017-11-11 DIAGNOSIS — Z780 Asymptomatic menopausal state: Secondary | ICD-10-CM

## 2017-11-14 ENCOUNTER — Ambulatory Visit (HOSPITAL_COMMUNITY)
Admission: RE | Admit: 2017-11-14 | Discharge: 2017-11-14 | Disposition: A | Discharge status: Home / Self Care | Payer: Medicare Other | Source: Ambulatory Visit | Attending: Pulmonary Disease | Admitting: Pulmonary Disease

## 2017-11-14 DIAGNOSIS — Z780 Asymptomatic menopausal state: Secondary | ICD-10-CM

## 2017-11-14 DIAGNOSIS — Z13820 Encounter for screening for osteoporosis: Secondary | ICD-10-CM | POA: Insufficient documentation

## 2017-11-14 DIAGNOSIS — M810 Age-related osteoporosis without current pathological fracture: Secondary | ICD-10-CM | POA: Insufficient documentation

## 2017-12-01 DIAGNOSIS — M810 Age-related osteoporosis without current pathological fracture: Secondary | ICD-10-CM | POA: Diagnosis not present

## 2018-01-10 DIAGNOSIS — H6123 Impacted cerumen, bilateral: Secondary | ICD-10-CM | POA: Diagnosis not present

## 2018-01-10 DIAGNOSIS — J00 Acute nasopharyngitis [common cold]: Secondary | ICD-10-CM | POA: Diagnosis not present

## 2018-01-10 DIAGNOSIS — H9193 Unspecified hearing loss, bilateral: Secondary | ICD-10-CM | POA: Diagnosis not present

## 2018-01-23 DIAGNOSIS — Z713 Dietary counseling and surveillance: Secondary | ICD-10-CM | POA: Diagnosis not present

## 2018-01-23 DIAGNOSIS — Z2821 Immunization not carried out because of patient refusal: Secondary | ICD-10-CM | POA: Diagnosis not present

## 2018-01-23 DIAGNOSIS — Z79899 Other long term (current) drug therapy: Secondary | ICD-10-CM | POA: Diagnosis not present

## 2018-01-23 DIAGNOSIS — M810 Age-related osteoporosis without current pathological fracture: Secondary | ICD-10-CM | POA: Diagnosis not present

## 2018-01-23 DIAGNOSIS — M130 Polyarthritis, unspecified: Secondary | ICD-10-CM | POA: Diagnosis not present

## 2018-01-23 DIAGNOSIS — E538 Deficiency of other specified B group vitamins: Secondary | ICD-10-CM | POA: Diagnosis not present

## 2018-01-25 DIAGNOSIS — N390 Urinary tract infection, site not specified: Secondary | ICD-10-CM | POA: Diagnosis not present

## 2018-04-19 DIAGNOSIS — Z713 Dietary counseling and surveillance: Secondary | ICD-10-CM | POA: Diagnosis not present

## 2018-04-19 DIAGNOSIS — M130 Polyarthritis, unspecified: Secondary | ICD-10-CM | POA: Diagnosis not present

## 2018-04-19 DIAGNOSIS — E538 Deficiency of other specified B group vitamins: Secondary | ICD-10-CM | POA: Diagnosis not present

## 2018-04-19 DIAGNOSIS — Z2821 Immunization not carried out because of patient refusal: Secondary | ICD-10-CM | POA: Diagnosis not present

## 2018-04-19 DIAGNOSIS — M810 Age-related osteoporosis without current pathological fracture: Secondary | ICD-10-CM | POA: Diagnosis not present

## 2018-06-01 DIAGNOSIS — M810 Age-related osteoporosis without current pathological fracture: Secondary | ICD-10-CM | POA: Diagnosis not present

## 2018-06-30 DIAGNOSIS — E039 Hypothyroidism, unspecified: Secondary | ICD-10-CM | POA: Diagnosis not present

## 2018-06-30 DIAGNOSIS — K5900 Constipation, unspecified: Secondary | ICD-10-CM | POA: Diagnosis not present

## 2018-06-30 DIAGNOSIS — D649 Anemia, unspecified: Secondary | ICD-10-CM | POA: Diagnosis not present

## 2018-06-30 DIAGNOSIS — E559 Vitamin D deficiency, unspecified: Secondary | ICD-10-CM | POA: Diagnosis not present

## 2018-06-30 DIAGNOSIS — K210 Gastro-esophageal reflux disease with esophagitis: Secondary | ICD-10-CM | POA: Diagnosis not present

## 2018-08-22 DIAGNOSIS — Z79899 Other long term (current) drug therapy: Secondary | ICD-10-CM | POA: Diagnosis not present

## 2018-08-22 DIAGNOSIS — M130 Polyarthritis, unspecified: Secondary | ICD-10-CM | POA: Diagnosis not present

## 2018-08-22 DIAGNOSIS — E538 Deficiency of other specified B group vitamins: Secondary | ICD-10-CM | POA: Diagnosis not present

## 2018-08-22 DIAGNOSIS — Z713 Dietary counseling and surveillance: Secondary | ICD-10-CM | POA: Diagnosis not present

## 2018-08-22 DIAGNOSIS — Z2821 Immunization not carried out because of patient refusal: Secondary | ICD-10-CM | POA: Diagnosis not present

## 2018-08-22 DIAGNOSIS — M810 Age-related osteoporosis without current pathological fracture: Secondary | ICD-10-CM | POA: Diagnosis not present

## 2018-10-11 ENCOUNTER — Other Ambulatory Visit (HOSPITAL_COMMUNITY): Payer: Self-pay | Admitting: Pulmonary Disease

## 2018-10-11 DIAGNOSIS — R221 Localized swelling, mass and lump, neck: Secondary | ICD-10-CM | POA: Diagnosis not present

## 2018-10-11 DIAGNOSIS — M545 Low back pain: Secondary | ICD-10-CM | POA: Diagnosis not present

## 2018-10-11 DIAGNOSIS — E039 Hypothyroidism, unspecified: Secondary | ICD-10-CM | POA: Diagnosis not present

## 2018-10-11 DIAGNOSIS — M810 Age-related osteoporosis without current pathological fracture: Secondary | ICD-10-CM | POA: Diagnosis not present

## 2018-10-27 ENCOUNTER — Ambulatory Visit (HOSPITAL_COMMUNITY)
Admission: RE | Admit: 2018-10-27 | Discharge: 2018-10-27 | Disposition: A | Discharge status: Home / Self Care | Payer: Medicare Other | Source: Ambulatory Visit | Attending: Pulmonary Disease | Admitting: Pulmonary Disease

## 2018-10-27 ENCOUNTER — Encounter (HOSPITAL_COMMUNITY): Payer: Self-pay

## 2018-10-27 ENCOUNTER — Other Ambulatory Visit: Payer: Self-pay

## 2018-10-27 DIAGNOSIS — R911 Solitary pulmonary nodule: Secondary | ICD-10-CM | POA: Diagnosis not present

## 2018-10-27 DIAGNOSIS — R221 Localized swelling, mass and lump, neck: Secondary | ICD-10-CM

## 2018-10-27 LAB — POCT I-STAT CREATININE: Creatinine, Ser: 1 mg/dL (ref 0.44–1.00)

## 2018-10-27 MED ORDER — IOHEXOL 300 MG/ML  SOLN
75.0000 mL | Freq: Once | INTRAMUSCULAR | Status: AC | PRN
  Administered 2018-10-27: 17:00:00 75 mL via INTRAVENOUS

## 2018-10-29 IMAGING — MR MR THORACIC SPINE W/O CM
4 of 6 series · 14 of 48 positions shown · non-contrast
Comparison: Radiography 06/29/2017.  MRI 06/09/2017.

CLINICAL DATA: Upper back pain over the last 3 weeks. Kyphoplasty
performed 3 weeks ago.

EXAM:
MRI THORACIC SPINE WITHOUT CONTRAST
TECHNIQUE: Multiplanar, multisequence MR imaging of the thoracic spine was
performed. No intravenous contrast was administered.

[Series 5: T1 · sagittal · 4.0mm · 0.62mm/px · 3 of 13 slices shown (1 of 2)]
[im 1/13]
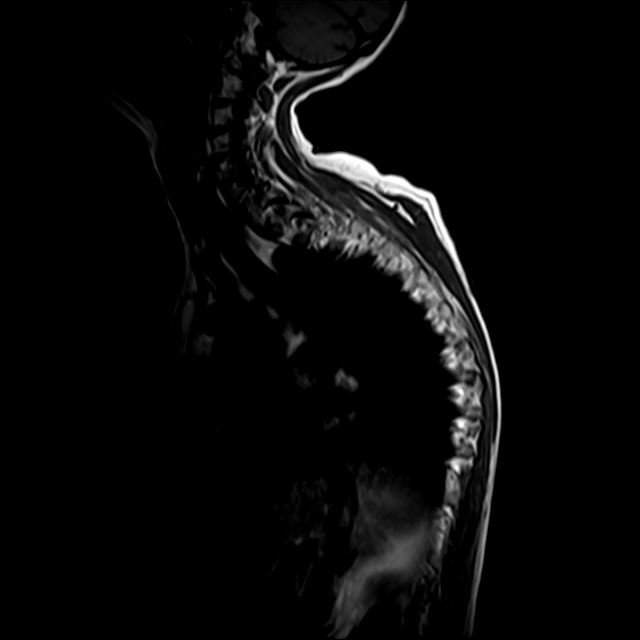
[im 9/13]
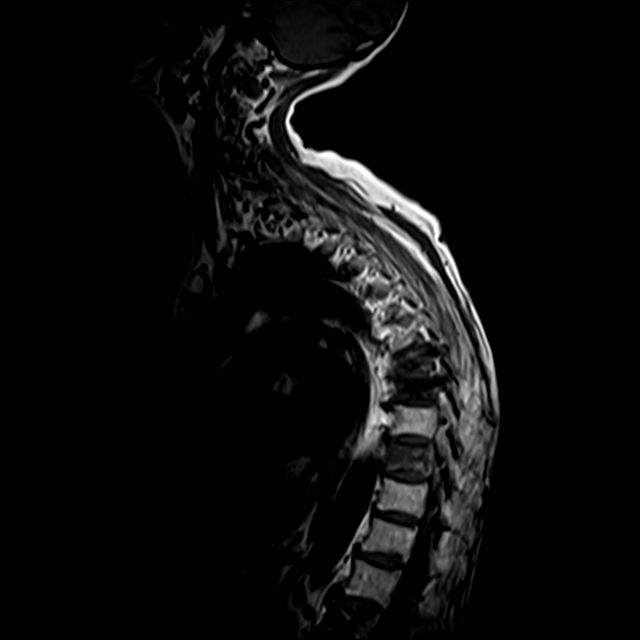
[im 13/13]
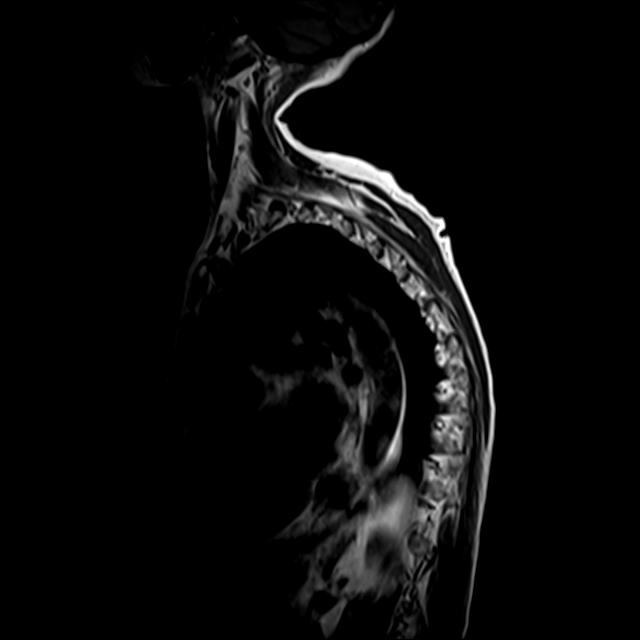

[Series 6: T2 · sagittal · 4.0mm · 0.45mm/px · 4 of 13 slices shown (1 of 2)]
[im 1/13]
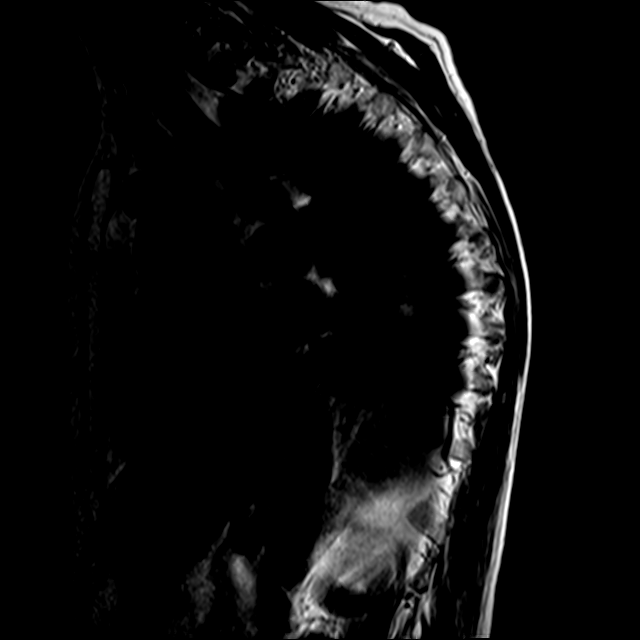
[im 5/13]
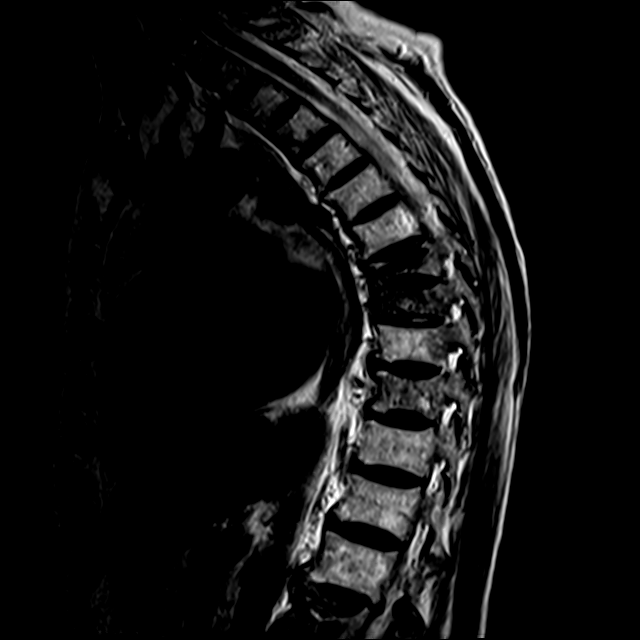
[im 9/13]
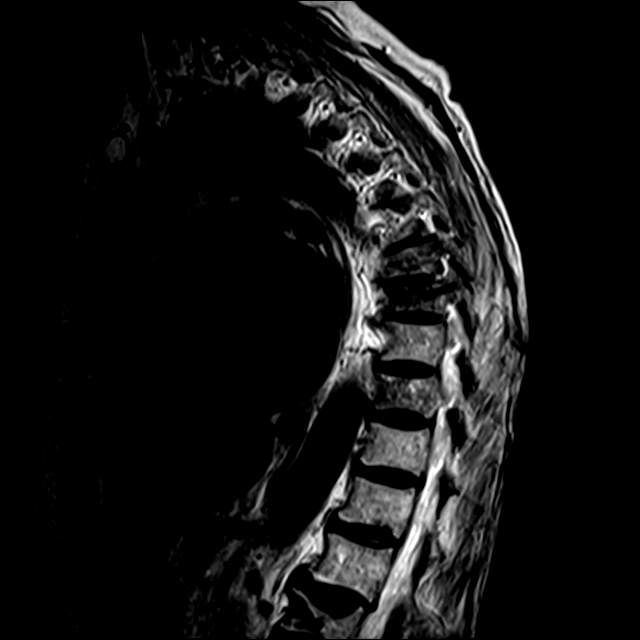
[im 13/13]
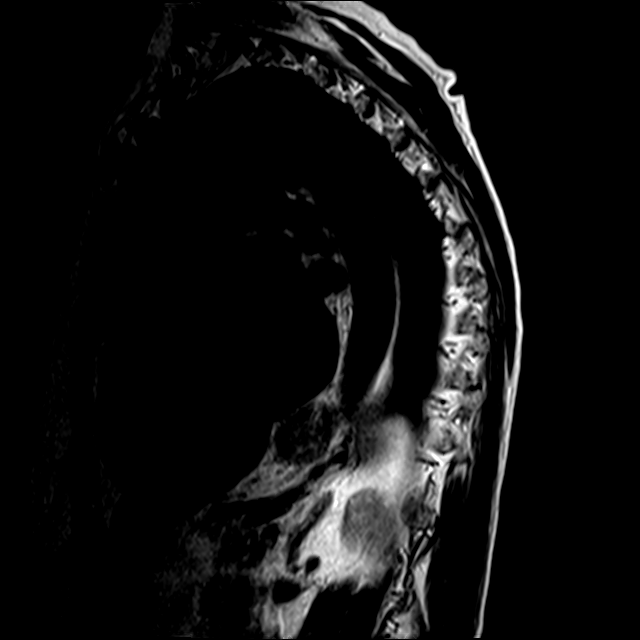

[Series 7: T1 · sagittal · 4.0mm · 0.45mm/px · 3 of 13 slices shown (2 of 2)]
[im 1/13]
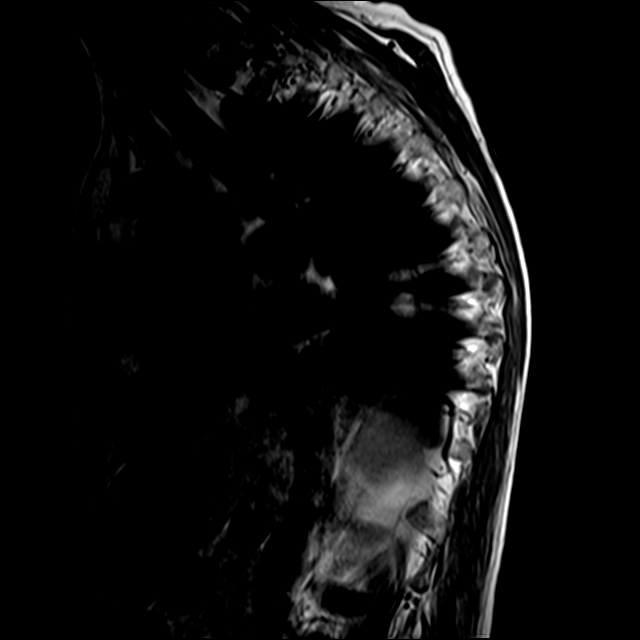
[im 7/13]
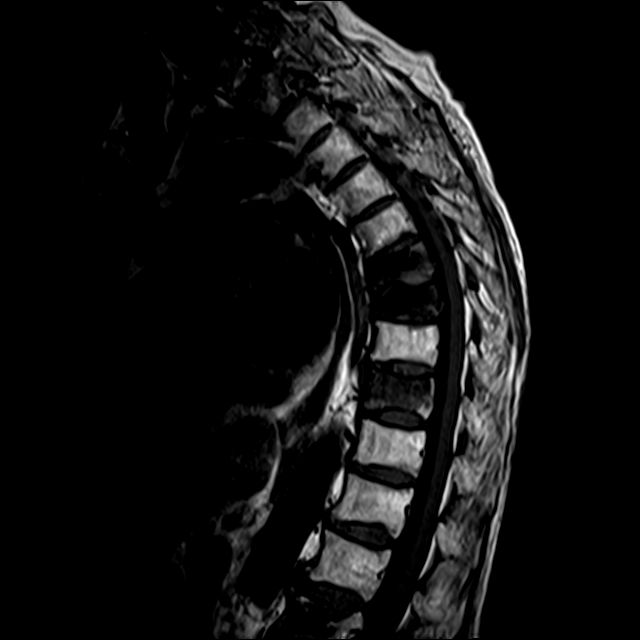
[im 13/13]
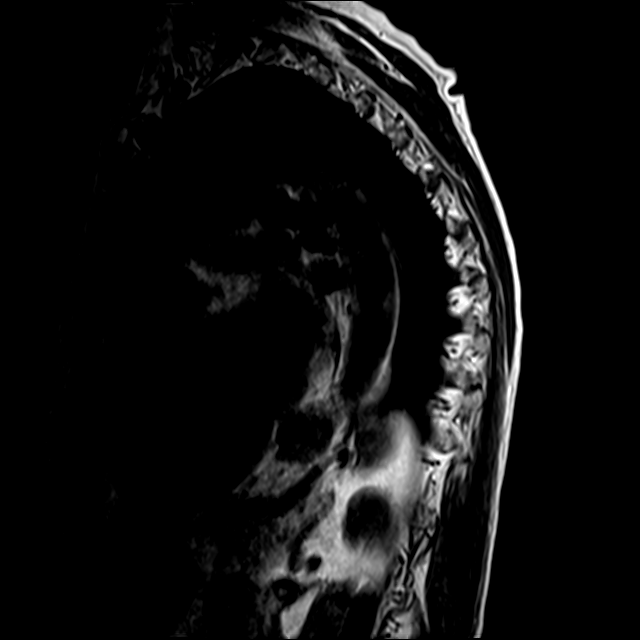

[Series 10: T2 · axial · 4.0mm · 0.24mm/px · z∈[-288,-122]mm · 4 of 42 slices shown (2 of 2)]
[im 1/42]
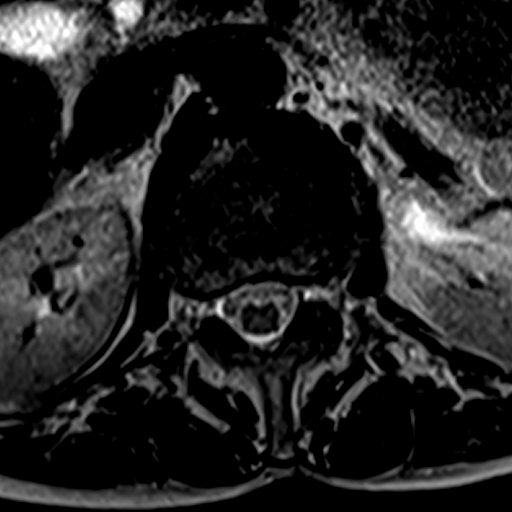
[im 6/42]
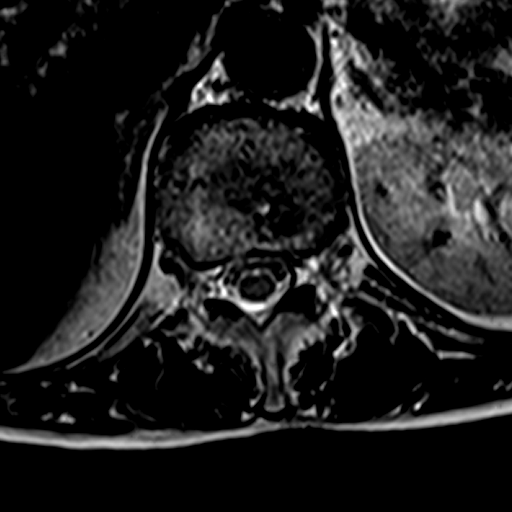
[im 21/42]
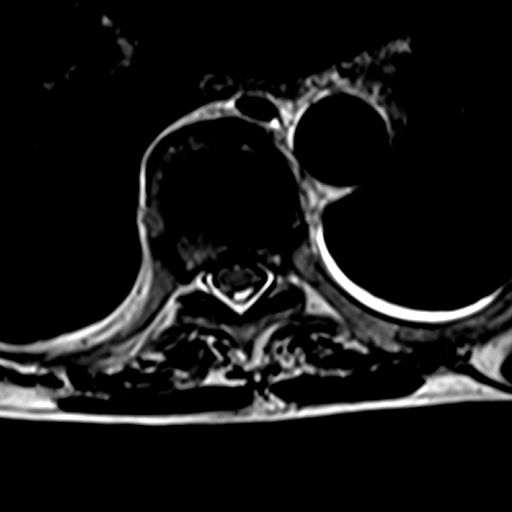
[im 36/42]
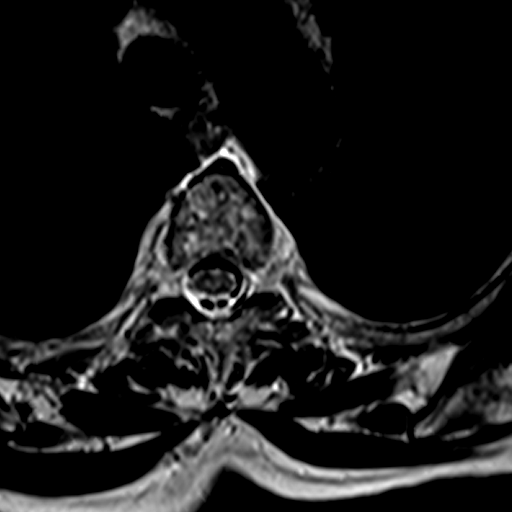

[14 of 48 positions shown; findings below may reference images not displayed]

FINDINGS: Alignment:  Unchanged with increased thoracic kyphotic curvature.

Vertebrae: Previously augmented fractures at T7 and T8 without
further collapse. Persistent edema at those levels consistent with
ongoing healing. Minimal posterior bowing of the posterior margins
of the vertebral bodies without compressive encroachment.

Newly seen fracture at T10 with loss of height of about 10-20%. No
retropulsed bone or spinal encroachment. Previously at this level,
there was a 1 cm focus of signal abnormality. On today's study, the
marrow pattern is indeterminate and could be seen with an acute
benign fracture, though I certainly cannot exclude the possibility
of a progressive underlying malignancy. Biopsy report from the
previous fractures did not show evidence of malignancy to my
reading.

9-10 mm T2 hyperintense focus in the posterior aspect of T6 appears
unchanged. No fracture at that level. I do not see the small L1-T2
focus that was measured on the previous exam.

Old augmented fracture at L2 is healed without residual edema.

Cord:  Negative

Paraspinal and other soft tissues: Negative

Disc levels:

No disc pathology.
IMPRESSION: Newly seen fracture at T10 with loss of height of only 10-20%, but
with considerable bone marrow edema, likely the cause of the new
pain syndrome. No retropulsed bone. Previously at this level, there
was a 1 cm marrow focus that was interpreted as indeterminate. The
marrow pattern today is markedly progressed, most likely edema
related to the fracture. I cannot exclude an underlying malignant
process however. I do note that the previous biopsy samples from T7
and T8 did not show any worrisome finding, to my reading of the
report.

Expected findings at T7 and T8 following vertebral augmentation.
Ongoing edema at those levels consistent with the healing process.
Minimal posterior bowing of the posterior margins of the vertebral
body but no compressive stenosis.

Old healed treated fracture at L2.

No apparent change in a T2 bright marrow focus within the posterior
aspect of the T6 vertebral body. Small focus in L1 previously
described is not seen. No newly seen marrow foci at other levels.

## 2018-12-04 DIAGNOSIS — M810 Age-related osteoporosis without current pathological fracture: Secondary | ICD-10-CM | POA: Diagnosis not present

## 2019-01-03 DIAGNOSIS — Z79899 Other long term (current) drug therapy: Secondary | ICD-10-CM | POA: Diagnosis not present

## 2019-01-03 DIAGNOSIS — M810 Age-related osteoporosis without current pathological fracture: Secondary | ICD-10-CM | POA: Diagnosis not present

## 2019-01-03 DIAGNOSIS — E538 Deficiency of other specified B group vitamins: Secondary | ICD-10-CM | POA: Diagnosis not present

## 2019-01-03 DIAGNOSIS — Z2821 Immunization not carried out because of patient refusal: Secondary | ICD-10-CM | POA: Diagnosis not present

## 2019-01-03 DIAGNOSIS — M130 Polyarthritis, unspecified: Secondary | ICD-10-CM | POA: Diagnosis not present

## 2019-01-03 DIAGNOSIS — Z713 Dietary counseling and surveillance: Secondary | ICD-10-CM | POA: Diagnosis not present

## 2019-03-30 DIAGNOSIS — M810 Age-related osteoporosis without current pathological fracture: Secondary | ICD-10-CM | POA: Diagnosis not present

## 2019-03-30 DIAGNOSIS — E538 Deficiency of other specified B group vitamins: Secondary | ICD-10-CM | POA: Diagnosis not present

## 2019-03-30 DIAGNOSIS — Z79899 Other long term (current) drug therapy: Secondary | ICD-10-CM | POA: Diagnosis not present

## 2019-04-03 DIAGNOSIS — E559 Vitamin D deficiency, unspecified: Secondary | ICD-10-CM | POA: Diagnosis not present

## 2019-04-03 DIAGNOSIS — M1990 Unspecified osteoarthritis, unspecified site: Secondary | ICD-10-CM | POA: Diagnosis not present

## 2019-04-03 DIAGNOSIS — Z713 Dietary counseling and surveillance: Secondary | ICD-10-CM | POA: Diagnosis not present

## 2019-04-03 DIAGNOSIS — Z2821 Immunization not carried out because of patient refusal: Secondary | ICD-10-CM | POA: Diagnosis not present

## 2019-04-03 DIAGNOSIS — Z79899 Other long term (current) drug therapy: Secondary | ICD-10-CM | POA: Diagnosis not present

## 2019-04-03 DIAGNOSIS — E538 Deficiency of other specified B group vitamins: Secondary | ICD-10-CM | POA: Diagnosis not present

## 2019-04-03 DIAGNOSIS — M810 Age-related osteoporosis without current pathological fracture: Secondary | ICD-10-CM | POA: Diagnosis not present

## 2019-06-07 DIAGNOSIS — M810 Age-related osteoporosis without current pathological fracture: Secondary | ICD-10-CM | POA: Diagnosis not present

## 2019-07-19 DIAGNOSIS — D519 Vitamin B12 deficiency anemia, unspecified: Secondary | ICD-10-CM | POA: Diagnosis not present

## 2019-07-19 DIAGNOSIS — Z0189 Encounter for other specified special examinations: Secondary | ICD-10-CM | POA: Diagnosis not present

## 2019-07-19 DIAGNOSIS — E039 Hypothyroidism, unspecified: Secondary | ICD-10-CM | POA: Diagnosis not present

## 2019-07-19 DIAGNOSIS — I25118 Atherosclerotic heart disease of native coronary artery with other forms of angina pectoris: Secondary | ICD-10-CM | POA: Diagnosis not present

## 2019-07-19 DIAGNOSIS — M859 Disorder of bone density and structure, unspecified: Secondary | ICD-10-CM | POA: Diagnosis not present

## 2019-07-27 DIAGNOSIS — R7301 Impaired fasting glucose: Secondary | ICD-10-CM | POA: Diagnosis not present

## 2019-07-27 DIAGNOSIS — Z0000 Encounter for general adult medical examination without abnormal findings: Secondary | ICD-10-CM | POA: Diagnosis not present

## 2019-07-27 DIAGNOSIS — E559 Vitamin D deficiency, unspecified: Secondary | ICD-10-CM | POA: Diagnosis not present

## 2019-07-27 DIAGNOSIS — E039 Hypothyroidism, unspecified: Secondary | ICD-10-CM | POA: Diagnosis not present

## 2019-08-02 DIAGNOSIS — E7800 Pure hypercholesterolemia, unspecified: Secondary | ICD-10-CM | POA: Diagnosis not present

## 2019-08-02 DIAGNOSIS — M1990 Unspecified osteoarthritis, unspecified site: Secondary | ICD-10-CM | POA: Diagnosis not present

## 2019-08-02 DIAGNOSIS — Z0001 Encounter for general adult medical examination with abnormal findings: Secondary | ICD-10-CM | POA: Diagnosis not present

## 2019-08-02 DIAGNOSIS — Z79899 Other long term (current) drug therapy: Secondary | ICD-10-CM | POA: Diagnosis not present

## 2019-08-02 DIAGNOSIS — E039 Hypothyroidism, unspecified: Secondary | ICD-10-CM | POA: Diagnosis not present

## 2019-08-02 DIAGNOSIS — D519 Vitamin B12 deficiency anemia, unspecified: Secondary | ICD-10-CM | POA: Diagnosis not present

## 2019-08-02 DIAGNOSIS — I25118 Atherosclerotic heart disease of native coronary artery with other forms of angina pectoris: Secondary | ICD-10-CM | POA: Diagnosis not present

## 2019-08-02 DIAGNOSIS — K219 Gastro-esophageal reflux disease without esophagitis: Secondary | ICD-10-CM | POA: Diagnosis not present

## 2019-08-02 DIAGNOSIS — M859 Disorder of bone density and structure, unspecified: Secondary | ICD-10-CM | POA: Diagnosis not present

## 2019-08-06 DIAGNOSIS — M1990 Unspecified osteoarthritis, unspecified site: Secondary | ICD-10-CM | POA: Diagnosis not present

## 2019-08-06 DIAGNOSIS — E559 Vitamin D deficiency, unspecified: Secondary | ICD-10-CM | POA: Diagnosis not present

## 2019-08-06 DIAGNOSIS — Z713 Dietary counseling and surveillance: Secondary | ICD-10-CM | POA: Diagnosis not present

## 2019-08-06 DIAGNOSIS — Z2821 Immunization not carried out because of patient refusal: Secondary | ICD-10-CM | POA: Diagnosis not present

## 2019-08-06 DIAGNOSIS — M810 Age-related osteoporosis without current pathological fracture: Secondary | ICD-10-CM | POA: Diagnosis not present

## 2019-08-06 DIAGNOSIS — Z79899 Other long term (current) drug therapy: Secondary | ICD-10-CM | POA: Diagnosis not present

## 2019-08-06 DIAGNOSIS — E538 Deficiency of other specified B group vitamins: Secondary | ICD-10-CM | POA: Diagnosis not present

## 2019-09-04 DIAGNOSIS — H6123 Impacted cerumen, bilateral: Secondary | ICD-10-CM | POA: Diagnosis not present

## 2019-09-10 DIAGNOSIS — H6123 Impacted cerumen, bilateral: Secondary | ICD-10-CM | POA: Diagnosis not present

## 2019-10-03 DIAGNOSIS — H35342 Macular cyst, hole, or pseudohole, left eye: Secondary | ICD-10-CM | POA: Diagnosis not present

## 2019-10-03 DIAGNOSIS — Z0100 Encounter for examination of eyes and vision without abnormal findings: Secondary | ICD-10-CM | POA: Diagnosis not present

## 2019-10-03 DIAGNOSIS — H401131 Primary open-angle glaucoma, bilateral, mild stage: Secondary | ICD-10-CM | POA: Diagnosis not present

## 2019-10-03 DIAGNOSIS — H524 Presbyopia: Secondary | ICD-10-CM | POA: Diagnosis not present

## 2019-10-03 DIAGNOSIS — Z961 Presence of intraocular lens: Secondary | ICD-10-CM | POA: Diagnosis not present

## 2019-10-23 DIAGNOSIS — U071 COVID-19: Secondary | ICD-10-CM | POA: Diagnosis not present

## 2019-10-24 ENCOUNTER — Other Ambulatory Visit: Payer: Self-pay | Admitting: Physician Assistant

## 2019-10-24 DIAGNOSIS — U071 COVID-19: Secondary | ICD-10-CM

## 2019-10-24 DIAGNOSIS — E079 Disorder of thyroid, unspecified: Secondary | ICD-10-CM

## 2019-10-24 NOTE — Progress Notes (Signed)
I connected by phone with Lori George on 10/24/2019 at 5:47 PM to discuss the potential use of a new treatment for mild to moderate COVID-19 viral infection in non-hospitalized patients.  This patient is a 86 y.o. female that meets the FDA criteria for Emergency Use Authorization of COVID monoclonal antibody casirivimab/imdevimab.  Has a (+) direct SARS-CoV-2 viral test result  Has mild or moderate COVID-19   Is NOT hospitalized due to COVID-19  Is within 10 days of symptom onset  Has at least one of the high risk factor(s) for progression to severe COVID-19 and/or hospitalization as defined in EUA.  Specific high risk criteria : Older age (>/= 84 yo)   I have spoken and communicated the following to the patient or parent/caregiver regarding COVID monoclonal antibody treatment:  1. FDA has authorized the emergency use for the treatment of mild to moderate COVID-19 in adults and pediatric patients with positive results of direct SARS-CoV-2 viral testing who are 18 years of age and older weighing at least 40 kg, and who are at high risk for progressing to severe COVID-19 and/or hospitalization.  2. The significant known and potential risks and benefits of COVID monoclonal antibody, and the extent to which such potential risks and benefits are unknown.  3. Information on available alternative treatments and the risks and benefits of those alternatives, including clinical trials.  4. Patients treated with COVID monoclonal antibody should continue to self-isolate and use infection control measures (e.g., wear mask, isolate, social distance, avoid sharing personal items, clean and disinfect "high touch" surfaces, and frequent handwashing) according to CDC guidelines.   5. The patient or parent/caregiver has the option to accept or refuse COVID monoclonal antibody treatment.  After reviewing this information with the patient, The patient agreed to proceed with receiving casirivimab\imdevimab  infusion and will be provided a copy of the Fact sheet prior to receiving the infusion.   [name] 10/24/2019 5:47 PM

## 2019-10-25 ENCOUNTER — Ambulatory Visit (HOSPITAL_COMMUNITY)
Admission: RE | Admit: 2019-10-25 | Discharge: 2019-10-25 | Disposition: A | Discharge status: Home / Self Care | Payer: Medicare Other | Source: Ambulatory Visit | Attending: Pulmonary Disease | Admitting: Pulmonary Disease

## 2019-10-25 DIAGNOSIS — U071 COVID-19: Secondary | ICD-10-CM

## 2019-10-25 DIAGNOSIS — Z23 Encounter for immunization: Secondary | ICD-10-CM | POA: Diagnosis not present

## 2019-10-25 MED ORDER — SODIUM CHLORIDE 0.9 % IV SOLN: INTRAVENOUS | Status: DC | PRN

## 2019-10-25 MED ORDER — METHYLPREDNISOLONE SODIUM SUCC 125 MG IJ SOLR: 125.0000 mg | Freq: Once | INTRAMUSCULAR | Status: DC | PRN

## 2019-10-25 MED ORDER — EPINEPHRINE 0.3 MG/0.3ML IJ SOAJ: 0.3000 mg | Freq: Once | INTRAMUSCULAR | Status: DC | PRN

## 2019-10-25 MED ORDER — ONDANSETRON HCL 4 MG/2ML IJ SOLN
4.0000 mg | Freq: Once | INTRAMUSCULAR | Status: AC
  Administered 2019-10-25: 4 mg via INTRAVENOUS
  Filled 2019-10-25: qty 2

## 2019-10-25 MED ORDER — ALBUTEROL SULFATE HFA 108 (90 BASE) MCG/ACT IN AERS: 2.0000 | INHALATION_SPRAY | Freq: Once | RESPIRATORY_TRACT | Status: DC | PRN

## 2019-10-25 MED ORDER — FAMOTIDINE IN NACL 20-0.9 MG/50ML-% IV SOLN: 20.0000 mg | Freq: Once | INTRAVENOUS | Status: DC | PRN

## 2019-10-25 MED ORDER — DIPHENHYDRAMINE HCL 50 MG/ML IJ SOLN: 50.0000 mg | Freq: Once | INTRAMUSCULAR | Status: DC | PRN

## 2019-10-25 MED ORDER — SODIUM CHLORIDE 0.9 % IV SOLN
1200.0000 mg | Freq: Once | INTRAVENOUS | Status: AC
  Administered 2019-10-25: 1200 mg via INTRAVENOUS
  Filled 2019-10-25: qty 10

## 2019-10-25 NOTE — Progress Notes (Signed)
  Diagnosis: COVID-19  Physician: Dr. Patrick Wright  Procedure: [facility] Med: casirivimab\imdevimab infusion - Provided patient with casirivimab\imdevimab fact sheet for patients, parents and caregivers prior to infusion.  Complications: No immediate complications noted.  Discharge: Discharged home   Ally Yow 10/25/2019   

## 2019-10-25 NOTE — Discharge Instructions (Signed)
10 Things You Can Do to Manage Your COVID-19 Symptoms at Home If you have possible or confirmed COVID-19: 1. Stay home from work and school. And stay away from other public places. If you must go out, avoid using any kind of public transportation, ridesharing, or taxis. 2. Monitor your symptoms carefully. If your symptoms get worse, call your healthcare provider immediately. 3. Get rest and stay hydrated. 4. If you have a medical appointment, call the healthcare provider ahead of time and tell them that you have or may have COVID-19. 5. For medical emergencies, call 911 and notify the dispatch personnel that you have or may have COVID-19. 6. Cover your cough and sneezes with a tissue or use the inside of your elbow. 7. Wash your hands often with soap and water for at least 20 seconds or clean your hands with an alcohol-based hand sanitizer that contains at least 60% alcohol. 8. As much as possible, stay in a specific room and away from other people in your home. Also, you should use a separate bathroom, if available. If you need to be around other people in or outside of the home, wear a mask. 9. Avoid sharing personal items with other people in your household, like dishes, towels, and bedding. 10. Clean all surfaces that are touched often, like counters, tabletops, and doorknobs. Use household cleaning sprays or wipes according to the label instructions. cdc.gov/coronavirus 08/30/2018 This information is not intended to replace advice given to you by your health care provider. Make sure you discuss any questions you have with your health care provider. Document Revised: 02/01/2019 Document Reviewed: 02/01/2019 Elsevier Patient Education  2020 [employer] 10 Things You Can Do to Manage Your COVID-19 Symptoms at Home If you have possible or confirmed COVID-19: 11. Stay home from work and school. And stay away from other public places. If you must go out, avoid using any kind of public  transportation, ridesharing, or taxis. 12. Monitor your symptoms carefully. If your symptoms get worse, call your healthcare provider immediately. 13. Get rest and stay hydrated. 14. If you have a medical appointment, call the healthcare provider ahead of time and tell them that you have or may have COVID-19. 15. For medical emergencies, call 911 and notify the dispatch personnel that you have or may have COVID-19. 16. Cover your cough and sneezes with a tissue or use the inside of your elbow. 17. Wash your hands often with soap and water for at least 20 seconds or clean your hands with an alcohol-based hand sanitizer that contains at least 60% alcohol. 18. As much as possible, stay in a specific room and away from other people in your home. Also, you should use a separate bathroom, if available. If you need to be around other people in or outside of the home, wear a mask. 19. Avoid sharing personal items with other people in your household, like dishes, towels, and bedding. 20. Clean all surfaces that are touched often, like counters, tabletops, and doorknobs. Use household cleaning sprays or wipes according to the label instructions. cdc.gov/coronavirus 08/30/2018 This information is not intended to replace advice given to you by your health care provider. Make sure you discuss any questions you have with your health care provider. Document Revised: 02/01/2019 Document Reviewed: 02/01/2019 Elsevier Patient Education  2020 [employer] What types of side effects do monoclonal antibody drugs cause?  Common side effects  In general, the more common side effects caused by monoclonal antibody drugs include: . Allergic reactions,   such as hives or itching . Flu-like signs and symptoms, including chills, fatigue, fever, and muscle aches and pains . Nausea, vomiting . Diarrhea . Skin rashes . Low blood pressure   The CDC is recommending patients who receive monoclonal antibody treatments wait  at least 90 days before being vaccinated.  Currently, there are no data on the safety and efficacy of mRNA COVID-19 vaccines in persons who received monoclonal antibodies or convalescent plasma as part of COVID-19 treatment. Based on the estimated half-life of such therapies as well as evidence suggesting that reinfection is uncommon in the 90 days after initial infection, vaccination should be deferred for at least 90 days, as a precautionary measure until additional information becomes available, to avoid interference of the antibody treatment with vaccine-induced immune responses. What types of side effects do monoclonal antibody drugs cause?  Common side effects  In general, the more common side effects caused by monoclonal antibody drugs include: . Allergic reactions, such as hives or itching . Flu-like signs and symptoms, including chills, fatigue, fever, and muscle aches and pains . Nausea, vomiting . Diarrhea . Skin rashes . Low blood pressure   The CDC is recommending patients who receive monoclonal antibody treatments wait at least 90 days before being vaccinated.  Currently, there are no data on the safety and efficacy of mRNA COVID-19 vaccines in persons who received monoclonal antibodies or convalescent plasma as part of COVID-19 treatment. Based on the estimated half-life of such therapies as well as evidence suggesting that reinfection is uncommon in the 90 days after initial infection, vaccination should be deferred for at least 90 days, as a precautionary measure until additional information becomes available, to avoid interference of the antibody treatment with vaccine-induced immune responses. 

## 2019-10-27 DIAGNOSIS — U071 COVID-19: Secondary | ICD-10-CM | POA: Diagnosis not present

## 2019-11-29 DIAGNOSIS — M810 Age-related osteoporosis without current pathological fracture: Secondary | ICD-10-CM | POA: Diagnosis not present

## 2019-11-29 DIAGNOSIS — Z79899 Other long term (current) drug therapy: Secondary | ICD-10-CM | POA: Diagnosis not present

## 2019-11-29 DIAGNOSIS — E559 Vitamin D deficiency, unspecified: Secondary | ICD-10-CM | POA: Diagnosis not present

## 2019-12-06 DIAGNOSIS — Z713 Dietary counseling and surveillance: Secondary | ICD-10-CM | POA: Diagnosis not present

## 2019-12-06 DIAGNOSIS — Z2821 Immunization not carried out because of patient refusal: Secondary | ICD-10-CM | POA: Diagnosis not present

## 2019-12-06 DIAGNOSIS — M1990 Unspecified osteoarthritis, unspecified site: Secondary | ICD-10-CM | POA: Diagnosis not present

## 2019-12-06 DIAGNOSIS — M810 Age-related osteoporosis without current pathological fracture: Secondary | ICD-10-CM | POA: Diagnosis not present

## 2019-12-06 DIAGNOSIS — E538 Deficiency of other specified B group vitamins: Secondary | ICD-10-CM | POA: Diagnosis not present

## 2019-12-06 DIAGNOSIS — E559 Vitamin D deficiency, unspecified: Secondary | ICD-10-CM | POA: Diagnosis not present

## 2019-12-06 DIAGNOSIS — Z79899 Other long term (current) drug therapy: Secondary | ICD-10-CM | POA: Diagnosis not present

## 2019-12-11 DIAGNOSIS — B9721 SARS-associated coronavirus as the cause of diseases classified elsewhere: Secondary | ICD-10-CM | POA: Diagnosis not present

## 2019-12-11 DIAGNOSIS — R7301 Impaired fasting glucose: Secondary | ICD-10-CM | POA: Diagnosis not present

## 2019-12-11 DIAGNOSIS — E039 Hypothyroidism, unspecified: Secondary | ICD-10-CM | POA: Diagnosis not present

## 2019-12-11 DIAGNOSIS — U071 COVID-19: Secondary | ICD-10-CM | POA: Diagnosis not present

## 2019-12-11 DIAGNOSIS — I10 Essential (primary) hypertension: Secondary | ICD-10-CM | POA: Diagnosis not present

## 2019-12-11 DIAGNOSIS — Z0000 Encounter for general adult medical examination without abnormal findings: Secondary | ICD-10-CM | POA: Diagnosis not present

## 2019-12-20 DIAGNOSIS — I25118 Atherosclerotic heart disease of native coronary artery with other forms of angina pectoris: Secondary | ICD-10-CM | POA: Diagnosis not present

## 2019-12-20 DIAGNOSIS — E039 Hypothyroidism, unspecified: Secondary | ICD-10-CM | POA: Diagnosis not present

## 2019-12-20 DIAGNOSIS — K219 Gastro-esophageal reflux disease without esophagitis: Secondary | ICD-10-CM | POA: Diagnosis not present

## 2019-12-20 DIAGNOSIS — D519 Vitamin B12 deficiency anemia, unspecified: Secondary | ICD-10-CM | POA: Diagnosis not present

## 2019-12-20 DIAGNOSIS — M859 Disorder of bone density and structure, unspecified: Secondary | ICD-10-CM | POA: Diagnosis not present

## 2019-12-20 DIAGNOSIS — E7800 Pure hypercholesterolemia, unspecified: Secondary | ICD-10-CM | POA: Diagnosis not present

## 2020-10-21 DIAGNOSIS — E7800 Pure hypercholesterolemia, unspecified: Secondary | ICD-10-CM | POA: Diagnosis not present

## 2020-10-27 DIAGNOSIS — M810 Age-related osteoporosis without current pathological fracture: Secondary | ICD-10-CM | POA: Diagnosis not present

## 2020-10-27 DIAGNOSIS — E7800 Pure hypercholesterolemia, unspecified: Secondary | ICD-10-CM | POA: Diagnosis not present

## 2020-10-27 DIAGNOSIS — K219 Gastro-esophageal reflux disease without esophagitis: Secondary | ICD-10-CM | POA: Diagnosis not present

## 2020-10-27 DIAGNOSIS — E039 Hypothyroidism, unspecified: Secondary | ICD-10-CM | POA: Diagnosis not present

## 2020-10-27 DIAGNOSIS — N182 Chronic kidney disease, stage 2 (mild): Secondary | ICD-10-CM | POA: Diagnosis not present

## 2020-10-27 DIAGNOSIS — I25729 Atherosclerosis of autologous artery coronary artery bypass graft(s) with unspecified angina pectoris: Secondary | ICD-10-CM | POA: Diagnosis not present

## 2020-12-11 DIAGNOSIS — M810 Age-related osteoporosis without current pathological fracture: Secondary | ICD-10-CM | POA: Diagnosis not present

## 2020-12-16 DIAGNOSIS — M1990 Unspecified osteoarthritis, unspecified site: Secondary | ICD-10-CM | POA: Diagnosis not present

## 2020-12-16 DIAGNOSIS — E559 Vitamin D deficiency, unspecified: Secondary | ICD-10-CM | POA: Diagnosis not present

## 2020-12-16 DIAGNOSIS — Z79899 Other long term (current) drug therapy: Secondary | ICD-10-CM | POA: Diagnosis not present

## 2020-12-22 DIAGNOSIS — Z79899 Other long term (current) drug therapy: Secondary | ICD-10-CM | POA: Diagnosis not present

## 2020-12-22 DIAGNOSIS — R5383 Other fatigue: Secondary | ICD-10-CM | POA: Diagnosis not present

## 2020-12-22 DIAGNOSIS — Z713 Dietary counseling and surveillance: Secondary | ICD-10-CM | POA: Diagnosis not present

## 2020-12-22 DIAGNOSIS — M1990 Unspecified osteoarthritis, unspecified site: Secondary | ICD-10-CM | POA: Diagnosis not present

## 2020-12-22 DIAGNOSIS — Z2821 Immunization not carried out because of patient refusal: Secondary | ICD-10-CM | POA: Diagnosis not present

## 2020-12-22 DIAGNOSIS — E559 Vitamin D deficiency, unspecified: Secondary | ICD-10-CM | POA: Diagnosis not present

## 2020-12-22 DIAGNOSIS — E538 Deficiency of other specified B group vitamins: Secondary | ICD-10-CM | POA: Diagnosis not present

## 2020-12-22 DIAGNOSIS — M810 Age-related osteoporosis without current pathological fracture: Secondary | ICD-10-CM | POA: Diagnosis not present

## 2021-03-08 ENCOUNTER — Inpatient Hospital Stay (HOSPITAL_COMMUNITY): Payer: Medicare Other

## 2021-03-08 ENCOUNTER — Emergency Department (HOSPITAL_COMMUNITY): Payer: Medicare Other

## 2021-03-08 ENCOUNTER — Inpatient Hospital Stay (HOSPITAL_COMMUNITY)
Admission: EM | Admit: 2021-03-08 | Discharge: 2021-03-13 | DRG: 521 | Disposition: A | Discharge status: Skilled Nursing Facility | Payer: Medicare Other | Attending: Internal Medicine | Admitting: Internal Medicine

## 2021-03-08 ENCOUNTER — Encounter (HOSPITAL_COMMUNITY): Payer: Self-pay | Admitting: Emergency Medicine

## 2021-03-08 DIAGNOSIS — S0990XA Unspecified injury of head, initial encounter: Secondary | ICD-10-CM | POA: Diagnosis not present

## 2021-03-08 DIAGNOSIS — Z20822 Contact with and (suspected) exposure to covid-19: Secondary | ICD-10-CM | POA: Diagnosis present

## 2021-03-08 DIAGNOSIS — W19XXXA Unspecified fall, initial encounter: Secondary | ICD-10-CM

## 2021-03-08 DIAGNOSIS — R109 Unspecified abdominal pain: Secondary | ICD-10-CM

## 2021-03-08 DIAGNOSIS — Y92010 Kitchen of single-family (private) house as the place of occurrence of the external cause: Secondary | ICD-10-CM

## 2021-03-08 DIAGNOSIS — S72042D Displaced fracture of base of neck of left femur, subsequent encounter for closed fracture with routine healing: Secondary | ICD-10-CM | POA: Diagnosis not present

## 2021-03-08 DIAGNOSIS — J189 Pneumonia, unspecified organism: Secondary | ICD-10-CM | POA: Diagnosis present

## 2021-03-08 DIAGNOSIS — M8008XA Age-related osteoporosis with current pathological fracture, vertebra(e), initial encounter for fracture: Secondary | ICD-10-CM | POA: Diagnosis present

## 2021-03-08 DIAGNOSIS — E785 Hyperlipidemia, unspecified: Secondary | ICD-10-CM | POA: Diagnosis present

## 2021-03-08 DIAGNOSIS — G8929 Other chronic pain: Secondary | ICD-10-CM | POA: Diagnosis not present

## 2021-03-08 DIAGNOSIS — K5900 Constipation, unspecified: Secondary | ICD-10-CM | POA: Diagnosis not present

## 2021-03-08 DIAGNOSIS — Z01818 Encounter for other preprocedural examination: Secondary | ICD-10-CM | POA: Diagnosis not present

## 2021-03-08 DIAGNOSIS — M7062 Trochanteric bursitis, left hip: Secondary | ICD-10-CM | POA: Diagnosis not present

## 2021-03-08 DIAGNOSIS — D62 Acute posthemorrhagic anemia: Secondary | ICD-10-CM | POA: Diagnosis not present

## 2021-03-08 DIAGNOSIS — H5789 Other specified disorders of eye and adnexa: Secondary | ICD-10-CM | POA: Diagnosis not present

## 2021-03-08 DIAGNOSIS — Z96649 Presence of unspecified artificial hip joint: Secondary | ICD-10-CM

## 2021-03-08 DIAGNOSIS — I517 Cardiomegaly: Secondary | ICD-10-CM | POA: Diagnosis not present

## 2021-03-08 DIAGNOSIS — Z8249 Family history of ischemic heart disease and other diseases of the circulatory system: Secondary | ICD-10-CM | POA: Diagnosis not present

## 2021-03-08 DIAGNOSIS — J9601 Acute respiratory failure with hypoxia: Secondary | ICD-10-CM | POA: Diagnosis not present

## 2021-03-08 DIAGNOSIS — J9690 Respiratory failure, unspecified, unspecified whether with hypoxia or hypercapnia: Secondary | ICD-10-CM | POA: Diagnosis not present

## 2021-03-08 DIAGNOSIS — H409 Unspecified glaucoma: Secondary | ICD-10-CM | POA: Diagnosis present

## 2021-03-08 DIAGNOSIS — K929 Disease of digestive system, unspecified: Secondary | ICD-10-CM | POA: Diagnosis not present

## 2021-03-08 DIAGNOSIS — Z4789 Encounter for other orthopedic aftercare: Secondary | ICD-10-CM | POA: Diagnosis not present

## 2021-03-08 DIAGNOSIS — K219 Gastro-esophageal reflux disease without esophagitis: Secondary | ICD-10-CM | POA: Diagnosis present

## 2021-03-08 DIAGNOSIS — Z96642 Presence of left artificial hip joint: Secondary | ICD-10-CM | POA: Diagnosis not present

## 2021-03-08 DIAGNOSIS — Y93E9 Activity, other interior property and clothing maintenance: Secondary | ICD-10-CM

## 2021-03-08 DIAGNOSIS — R9431 Abnormal electrocardiogram [ECG] [EKG]: Secondary | ICD-10-CM | POA: Diagnosis not present

## 2021-03-08 DIAGNOSIS — Z471 Aftercare following joint replacement surgery: Secondary | ICD-10-CM | POA: Diagnosis not present

## 2021-03-08 DIAGNOSIS — Z7401 Bed confinement status: Secondary | ICD-10-CM | POA: Diagnosis not present

## 2021-03-08 DIAGNOSIS — E039 Hypothyroidism, unspecified: Secondary | ICD-10-CM | POA: Diagnosis not present

## 2021-03-08 DIAGNOSIS — R110 Nausea: Secondary | ICD-10-CM | POA: Diagnosis not present

## 2021-03-08 DIAGNOSIS — D696 Thrombocytopenia, unspecified: Secondary | ICD-10-CM | POA: Diagnosis present

## 2021-03-08 DIAGNOSIS — R1111 Vomiting without nausea: Secondary | ICD-10-CM | POA: Diagnosis not present

## 2021-03-08 DIAGNOSIS — Z79899 Other long term (current) drug therapy: Secondary | ICD-10-CM | POA: Diagnosis not present

## 2021-03-08 DIAGNOSIS — H9190 Unspecified hearing loss, unspecified ear: Secondary | ICD-10-CM | POA: Diagnosis present

## 2021-03-08 DIAGNOSIS — W010XXA Fall on same level from slipping, tripping and stumbling without subsequent striking against object, initial encounter: Secondary | ICD-10-CM | POA: Diagnosis present

## 2021-03-08 DIAGNOSIS — Z7982 Long term (current) use of aspirin: Secondary | ICD-10-CM | POA: Diagnosis not present

## 2021-03-08 DIAGNOSIS — M2550 Pain in unspecified joint: Secondary | ICD-10-CM | POA: Diagnosis not present

## 2021-03-08 DIAGNOSIS — J181 Lobar pneumonia, unspecified organism: Secondary | ICD-10-CM | POA: Diagnosis not present

## 2021-03-08 DIAGNOSIS — Z8673 Personal history of transient ischemic attack (TIA), and cerebral infarction without residual deficits: Secondary | ICD-10-CM

## 2021-03-08 DIAGNOSIS — M25552 Pain in left hip: Secondary | ICD-10-CM | POA: Diagnosis present

## 2021-03-08 DIAGNOSIS — Z79890 Hormone replacement therapy (postmenopausal): Secondary | ICD-10-CM

## 2021-03-08 DIAGNOSIS — D649 Anemia, unspecified: Secondary | ICD-10-CM | POA: Diagnosis not present

## 2021-03-08 DIAGNOSIS — S72002A Fracture of unspecified part of neck of left femur, initial encounter for closed fracture: Secondary | ICD-10-CM | POA: Diagnosis not present

## 2021-03-08 DIAGNOSIS — S72009A Fracture of unspecified part of neck of unspecified femur, initial encounter for closed fracture: Secondary | ICD-10-CM | POA: Diagnosis not present

## 2021-03-08 DIAGNOSIS — R231 Pallor: Secondary | ICD-10-CM | POA: Diagnosis not present

## 2021-03-08 DIAGNOSIS — M8088XD Other osteoporosis with current pathological fracture, vertebra(e), subsequent encounter for fracture with routine healing: Secondary | ICD-10-CM | POA: Diagnosis not present

## 2021-03-08 DIAGNOSIS — J984 Other disorders of lung: Secondary | ICD-10-CM | POA: Diagnosis not present

## 2021-03-08 DIAGNOSIS — M8080XA Other osteoporosis with current pathological fracture, unspecified site, initial encounter for fracture: Secondary | ICD-10-CM | POA: Diagnosis present

## 2021-03-08 LAB — COMPREHENSIVE METABOLIC PANEL
ALT: 21 U/L (ref 0–44)
AST: 22 U/L (ref 15–41)
Albumin: 4 g/dL (ref 3.5–5.0)
Alkaline Phosphatase: 48 U/L (ref 38–126)
Anion gap: 8 (ref 5–15)
BUN: 30 mg/dL — ABNORMAL HIGH (ref 8–23)
CO2: 26 mmol/L (ref 22–32)
Calcium: 8.8 mg/dL — ABNORMAL LOW (ref 8.9–10.3)
Chloride: 106 mmol/L (ref 98–111)
Creatinine, Ser: 0.87 mg/dL (ref 0.44–1.00)
GFR, Estimated: >60 mL/min (ref >60)
Glucose, Bld: 134 mg/dL — ABNORMAL HIGH (ref 70–99)
Potassium: 4.2 mmol/L (ref 3.5–5.1)
Sodium: 140 mmol/L (ref 135–145)
Total Bilirubin: 0.6 mg/dL (ref 0.3–1.2)
Total Protein: 6.4 g/dL — ABNORMAL LOW (ref 6.5–8.1)

## 2021-03-08 LAB — CBC WITH DIFFERENTIAL/PLATELET
Abs Immature Granulocytes: 0.06 10*3/uL (ref 0.00–0.07)
Basophils Absolute: 0.1 10*3/uL (ref 0.0–0.1)
Basophils Relative: 0 %
Eosinophils Absolute: 0 10*3/uL (ref 0.0–0.5)
Eosinophils Relative: 0 %
HCT: 41.3 % (ref 36.0–46.0)
Hemoglobin: 13.4 g/dL (ref 12.0–15.0)
Immature Granulocytes: 1 %
Lymphocytes Relative: 7 %
Lymphs Abs: 0.8 10*3/uL (ref 0.7–4.0)
MCH: 31.7 pg (ref 26.0–34.0)
MCHC: 32.4 g/dL (ref 30.0–36.0)
MCV: 97.6 fL (ref 80.0–100.0)
Monocytes Absolute: 0.7 10*3/uL (ref 0.1–1.0)
Monocytes Relative: 6 %
Neutro Abs: 9.6 10*3/uL — ABNORMAL HIGH (ref 1.7–7.7)
Neutrophils Relative %: 86 %
Platelets: 223 10*3/uL (ref 150–400)
RBC: 4.23 MIL/uL (ref 3.87–5.11)
RDW: 12.8 % (ref 11.5–15.5)
WBC: 11.2 10*3/uL — ABNORMAL HIGH (ref 4.0–10.5)
nRBC: 0 % (ref 0.0–0.2)

## 2021-03-08 LAB — RESP PANEL BY RT-PCR (FLU A&B, COVID) ARPGX2
Influenza A by PCR: NEGATIVE
Influenza B by PCR: NEGATIVE
SARS Coronavirus 2 by RT PCR: NEGATIVE

## 2021-03-08 LAB — PROCALCITONIN: Procalcitonin: <0.1 ng/mL

## 2021-03-08 MED ORDER — METOCLOPRAMIDE HCL 5 MG/ML IJ SOLN
10.0000 mg | Freq: Once | INTRAMUSCULAR | Status: AC
  Administered 2021-03-08: 10 mg via INTRAVENOUS
  Filled 2021-03-08: qty 2

## 2021-03-08 MED ORDER — ONDANSETRON HCL 4 MG/2ML IJ SOLN
4.0000 mg | Freq: Once | INTRAMUSCULAR | Status: AC
  Administered 2021-03-08: 4 mg via INTRAVENOUS
  Filled 2021-03-08: qty 2

## 2021-03-08 MED ORDER — DEXTROSE-NACL 5-0.9 % IV SOLN: INTRAVENOUS | Status: AC

## 2021-03-08 MED ORDER — HYDROMORPHONE HCL 1 MG/ML IJ SOLN
0.5000 mg | Freq: Once | INTRAMUSCULAR | Status: AC
  Administered 2021-03-08: 0.5 mg via INTRAVENOUS
  Filled 2021-03-08: qty 1

## 2021-03-08 MED ORDER — HYDROMORPHONE HCL 1 MG/ML IJ SOLN
0.5000 mg | INTRAMUSCULAR | Status: DC | PRN
  Administered 2021-03-09 (×2): 0.5 mg via INTRAVENOUS
  Filled 2021-03-08 (×2): qty 1

## 2021-03-08 MED ORDER — MORPHINE SULFATE (PF) 2 MG/ML IV SOLN
2.0000 mg | Freq: Once | INTRAVENOUS | Status: AC
  Administered 2021-03-08: 2 mg via INTRAVENOUS
  Filled 2021-03-08: qty 1

## 2021-03-08 MED ORDER — SODIUM CHLORIDE 0.9 % IV SOLN
12.5000 mg | Freq: Four times a day (QID) | INTRAVENOUS | Status: DC | PRN
  Administered 2021-03-09: 03:00:00 12.5 mg via INTRAVENOUS
  Filled 2021-03-08: qty 0.5

## 2021-03-08 MED ORDER — SODIUM CHLORIDE 0.9 % IV SOLN
1.5000 g | Freq: Four times a day (QID) | INTRAVENOUS | Status: DC
  Administered 2021-03-09 – 2021-03-11 (×9): 1.5 g via INTRAVENOUS
  Filled 2021-03-08 (×12): qty 4

## 2021-03-08 MED ORDER — LEVOTHYROXINE SODIUM 75 MCG PO TABS
75.0000 ug | ORAL_TABLET | Freq: Every day | ORAL | Status: DC
  Administered 2021-03-10 – 2021-03-13 (×4): 75 ug via ORAL
  Filled 2021-03-08 (×7): qty 1

## 2021-03-08 MED ORDER — POLYETHYLENE GLYCOL 3350 17 G PO PACK: 17.0000 g | PACK | Freq: Every day | ORAL | Status: DC | PRN

## 2021-03-08 MED ORDER — SODIUM CHLORIDE 0.9 % IV SOLN
500.0000 mg | INTRAVENOUS | Status: DC
  Administered 2021-03-08 – 2021-03-10 (×3): 500 mg via INTRAVENOUS
  Filled 2021-03-08 (×3): qty 5

## 2021-03-08 MED ORDER — ASPIRIN EC 81 MG PO TBEC
81.0000 mg | DELAYED_RELEASE_TABLET | Freq: Every day | ORAL | Status: DC
  Administered 2021-03-09 – 2021-03-13 (×4): 81 mg via ORAL
  Filled 2021-03-08 (×4): qty 1

## 2021-03-08 MED ORDER — PANTOPRAZOLE SODIUM 40 MG PO TBEC
40.0000 mg | DELAYED_RELEASE_TABLET | Freq: Every day | ORAL | Status: DC
  Administered 2021-03-09 – 2021-03-13 (×4): 40 mg via ORAL
  Filled 2021-03-08 (×4): qty 1

## 2021-03-08 MED ORDER — SODIUM CHLORIDE 0.9 % IV BOLUS
500.0000 mL | Freq: Once | INTRAVENOUS | Status: AC
  Administered 2021-03-08: 500 mL via INTRAVENOUS

## 2021-03-08 NOTE — ED Provider Notes (Signed)
[facility] EMERGENCY DEPARTMENT Provider Note   CSN: [id number] Arrival date & time: 03/08/21  1901     History  Chief Complaint  Patient presents with   Fall    Lori George is a 88 y.o. female.  Patient fell at home and complains of pain in her left hip.  Patient has a history of a stroke and is on aspirin and has thyroid disease  The history is provided by the patient and medical records. No language interpreter was used.  Fall This is a new problem. The current episode started less than 1 hour ago. The problem has not changed since onset.Pertinent negatives include no chest pain, no abdominal pain and no headaches. Nothing aggravates the symptoms. Nothing relieves the symptoms. She has tried nothing for the symptoms.      Home Medications Prior to Admission medications   Medication Sig Start Date End Date Taking? Authorizing Provider  acetaminophen (TYLENOL) 500 MG tablet Take 500-1,000 mg by mouth every 6 (six) hours as needed for mild pain or moderate pain.   Yes [provider]  aspirin EC 81 MG tablet Take 81 mg by mouth daily.   Yes [provider]  calcium carbonate (TUMS - DOSED IN MG ELEMENTAL CALCIUM) 500 MG chewable tablet Chew 1 tablet by mouth daily as needed for heartburn.    Yes [provider]  CALCIUM PO Take 1 tablet by mouth daily.   Yes [provider]  Levothyroxine Sodium 75 MCG CAPS Take 75 mcg by mouth daily before breakfast.   Yes [provider]  Multiple Vitamins-Minerals (MULTIVITAMIN WITH MINERALS) tablet Take 1 tablet by mouth daily.   Yes [provider]  pantoprazole (PROTONIX) 40 MG tablet Take 1 tablet by mouth daily. 06/06/17  Yes [provider]  timolol (TIMOPTIC) 0.5 % ophthalmic solution Place 1 drop into the right eye daily.  04/18/14  Yes [provider]  VITAMIN D, CHOLECALCIFEROL, PO Take 1 capsule by mouth daily.   Yes [provider]  brimonidine  (ALPHAGAN) 0.2 % ophthalmic solution Place 1 drop into both eyes 2 (two) times daily.  Patient not taking: Reported on 03/08/2021 05/30/14   [provider]  diazepam (VALIUM) 5 MG tablet Take 0.5-1 tablets (2.5-5 mg total) by mouth every 8 (eight) hours as needed for muscle spasms. Patient not taking: Reported on 03/08/2021 06/29/17   Virgel Manifold, MD  HYDROcodone-acetaminophen (NORCO/VICODIN) 5-325 MG tablet Take 1 tablet by mouth every 6 (six) hours as needed for moderate pain. Patient not taking: Reported on 03/08/2021    [provider]  linaclotide Rolan Lipa) 72 MCG capsule Take 72 mcg by mouth daily before breakfast. Patient not taking: Reported on 03/08/2021    [provider]  PROCTO-MED HC 2.5 % rectal cream Place 1 application rectally 2 (two) times daily. Patient not taking: Reported on 03/08/2021 06/10/17   [provider]      Allergies    Patient has no known allergies.    Review of Systems   Review of Systems  Constitutional:  Negative for appetite change and fatigue.  HENT:  Negative for congestion, ear discharge and sinus pressure.   Eyes:  Negative for discharge.  Respiratory:  Negative for cough.   Cardiovascular:  Negative for chest pain.  Gastrointestinal:  Negative for abdominal pain and diarrhea.  Genitourinary:  Negative for frequency and hematuria.  Musculoskeletal:  Negative for back pain.       Fall  Skin:  Negative  for rash.  Neurological:  Negative for seizures and headaches.  Psychiatric/Behavioral:  Negative for hallucinations.    Physical Exam Updated Vital Signs BP (!) 141/78    Pulse 87    Temp 98.3 F (36.8 C) (Oral)    Resp 16    Ht 5\' 4"  (1.626 m)    Wt 49.9 kg    SpO2 97%    BMI 18.88 kg/m  Physical Exam Vitals and nursing note reviewed.  Constitutional:      Appearance: She is well-developed.  HENT:     Head: Normocephalic.     Nose: Nose normal.  Eyes:     General: No scleral icterus.    Conjunctiva/sclera:  Conjunctivae normal.  Neck:     Thyroid: No thyromegaly.  Cardiovascular:     Rate and Rhythm: Normal rate and regular rhythm.     Heart sounds: No murmur heard.   No friction rub. No gallop.  Pulmonary:     Breath sounds: No stridor. No wheezing or rales.  Chest:     Chest wall: No tenderness.  Abdominal:     General: There is no distension.     Tenderness: There is no abdominal tenderness. There is no rebound.  Musculoskeletal:        General: Normal range of motion.     Cervical back: Neck supple.     Comments: Tender left hip  Lymphadenopathy:     Cervical: No cervical adenopathy.  Skin:    Findings: No erythema or rash.  Neurological:     Mental Status: She is alert and oriented to person, place, and time.     Motor: No abnormal muscle tone.     Coordination: Coordination normal.  Psychiatric:        Behavior: Behavior normal.    ED Results / Procedures / Treatments   Labs (all labs ordered are listed, but only abnormal results are displayed) Labs Reviewed  CBC WITH DIFFERENTIAL/PLATELET - Abnormal; Notable for the following components:      Result Value   WBC 11.2 (*)    Neutro Abs 9.6 (*)    All other components within normal limits  COMPREHENSIVE METABOLIC PANEL - Abnormal; Notable for the following components:   Glucose, Bld 134 (*)    BUN 30 (*)    Calcium 8.8 (*)    Total Protein 6.4 (*)    All other components within normal limits  RESP PANEL BY RT-PCR (FLU A&B, COVID) [id number]    EKG None  Radiology CT Head Wo Contrast  Result Date: 03/08/2021 CLINICAL DATA:  Fall. Head trauma, intracranial arterial injury suspected EXAM: CT HEAD WITHOUT CONTRAST TECHNIQUE: Contiguous axial images were obtained from the base of the skull through the vertex without intravenous contrast. COMPARISON:  None. FINDINGS: Brain: No acute intracranial abnormality. Specifically, no hemorrhage, hydrocephalus, mass lesion, acute infarction, or significant intracranial injury.  Vascular: No hyperdense vessel or unexpected calcification. Skull: No acute calvarial abnormality. Sinuses/Orbits: No acute findings Other: None IMPRESSION: No acute intracranial abnormality. Electronically Signed   By: Rolm Baptise M.D.   On: 03/08/2021 20:34   DG Hip Unilat W or Wo Pelvis 2-3 Views Left  Result Date: 03/08/2021 CLINICAL DATA:  Fall with pain. EXAM: DG HIP (WITH OR WITHOUT PELVIS) 2-3V LEFT COMPARISON:  None. FINDINGS: Displaced left femoral neck fracture. Fracture is mildly comminuted with proximal migration of the femoral shaft. Femoral head is well seated in the acetabulum. The bones are under mineralized. Pubic rami are grossly intact.  Vertebroplasty within the lower lumbar spine. IMPRESSION: Displaced left femoral neck fracture. Electronically Signed   By: Keith Rake M.D.   On: 03/08/2021 19:48    Procedures Procedures    Medications Ordered in ED Medications  morphine 2 MG/ML injection 2 mg (2 mg Intravenous Given 03/08/21 1948)  ondansetron (ZOFRAN) injection 4 mg (4 mg Intravenous Given 03/08/21 1948)  HYDROmorphone (DILAUDID) injection 0.5 mg (0.5 mg Intravenous Given 03/08/21 2033)  metoCLOPramide (REGLAN) injection 10 mg (10 mg Intravenous Given 03/08/21 2103)    ED Course/ Medical Decision Making/ A&P                           Medical Decision Making  Patient with a femoral neck fracture on the left side.  Dr. Percell Miller orthopedic surgeon will see the patient [facility] and possibly repair her hip tomorrow.  She will be admitted to medicine   This patient presents to the ED for concern of fall, this involves an extensive number of treatment options, and is a complaint that carries with it a high risk of complications and morbidity.  The differential diagnosis includes hip fracture, pelvic fracture, contusion to left hip   Co morbidities that complicate the patient evaluation  CVA, thyroid disease   Additional history obtained:  Additional history  obtained from relatives and patient External records from outside source obtained and reviewed including hospital records   Lab Tests:  I Ordered, and personally interpreted labs.  The pertinent results include: BUN elevated 30   Imaging Studies ordered:  I ordered imaging studies including left hip x-ray and CT head I independently visualized and interpreted imaging which showed femoral neck fracture on the left.  Head CT unremarkable I agree with the radiologist interpretation   Cardiac Monitoring:  The patient was maintained on a cardiac monitor.  I personally viewed and interpreted the cardiac monitored which showed an underlying rhythm of: Normal sinus rhythm   Medicines ordered and prescription drug management:  I ordered medication including Dilaudid for pain and Zofran for nausea Reevaluation of the patient after these medicines showed that the patient improved I have reviewed the patients home medicines and have made adjustments as needed   Test Considered:  CT scan of the hip   Critical Interventions:  Pain medicine and consult orthopedic   Consultations Obtained:  I requested consultation with the orthopedic surgeon,  and discussed lab and imaging findings as well as pertinent plan - they recommend: Admission to [facility] by the hospitalist and patient should be n.p.o. after midnight for possible surgery tomorrow   Problem List / ED Course:  Left hip fracture   Reevaluation:  After the interventions noted above, I reevaluated the patient and found that they have :stayed the same   Social Determinants of Health:  None   Dispostion:  After consideration of the diagnostic results and the patients response to treatment, I feel that the patent would benefit from admission to the hospital over [facility] for left hip fracture and possible surgery tomorrow.         Final Clinical Impression(s) / ED Diagnoses Final diagnoses:  Closed  left hip fracture, initial encounter [facility])    Rx / [place] Orders ED Discharge Orders     None         Milton Ferguson, MD 03/08/21 2114

## 2021-03-08 NOTE — ED Notes (Signed)
Patient transported to CT 

## 2021-03-08 NOTE — ED Notes (Signed)
Family member took pts hearing aids home

## 2021-03-08 NOTE — ED Notes (Signed)
Family at bedside. Pt returned from CT Scan. Pts hearing aides placed in belongings bag.

## 2021-03-08 NOTE — ED Notes (Signed)
ED Provider at bedside. 

## 2021-03-08 NOTE — H&P (Addendum)
History and Physical    Lori George [id number] DOB: Dec 31, 1932 DOA: 03/08/2021  PCP: Kari Baars, MD   Patient coming from: Home  I have personally briefly reviewed patient's old medical records in [facility]  Chief Complaint: Fall  HPI: Lori George is a 88 y.o. female with medical history significant for  stroke, thyroid disease.  Patient was brought to the ED with reports of a fall at home.  At the time of my evaluation patient is awake, but she is in severe pain and very hard of hearing so history is limited.  She tells me she feels ill.  Patient's son [name] is at bedside and assist with the history.  Patient lives alone, and is independent of all ADLs.  Earlier today patient had cooked lunch for heart family who came to visit.  She had no complaints.  Later after family had left, patient was in the kitchen cleaning, patient's son believes patient slipped and fell in the kitchen.  Patient subsequently called family on the phone, her sister and had 2 sons.  EMS gave 8 mg of morphine in route.  ED Course: O2 sats 88% on room air placed on 2 L sats 94 to 97%.  Temperature 98.3.  Heart rate 80s.  Respiratory rate 16-21.  Blood pressure systolic 130s to 400Q.  Patient received another 2 mg of morphine, and 0.5 mg of Dilaudid in the ED still with persistent pain and discomfort. Pelvic x-ray shows a displaced left femoral neck fracture.  Head CT was without acute abnormality. EDP talked with Dr. Eulah Pont, recommended admission to [facility], plan for surgery tomorrow.  Review of Systems: As per HPI all other systems reviewed and negative.  Past Medical History:  Diagnosis Date   Glaucoma    Stroke [facility])    Thyroid disease     Past Surgical History:  Procedure Laterality Date   BACK SURGERY     IR FLUORO GUIDED NEEDLE PLC ASPIRATION/INJECTION LOC  06/17/2017   IR KYPHO EA ADDL LEVEL THORACIC OR LUMBAR  06/17/2017   IR KYPHO THORACIC WITH BONE BIOPSY  06/17/2017   IR  RADIOLOGIST EVAL & MGMT  06/13/2017     reports that she has never smoked. She has never used smokeless tobacco. She reports that she does not drink alcohol and does not use drugs.  No Known Allergies  Family history of hypertension.  Prior to Admission medications   Medication Sig Start Date End Date Taking? Authorizing Provider  acetaminophen (TYLENOL) 500 MG tablet Take 500-1,000 mg by mouth every 6 (six) hours as needed for mild pain or moderate pain.   Yes [provider]  aspirin EC 81 MG tablet Take 81 mg by mouth daily.   Yes [provider]  calcium carbonate (TUMS - DOSED IN MG ELEMENTAL CALCIUM) 500 MG chewable tablet Chew 1 tablet by mouth daily as needed for heartburn.    Yes [provider]  CALCIUM PO Take 1 tablet by mouth daily.   Yes [provider]  Levothyroxine Sodium 75 MCG CAPS Take 75 mcg by mouth daily before breakfast.   Yes [provider]  Multiple Vitamins-Minerals (MULTIVITAMIN WITH MINERALS) tablet Take 1 tablet by mouth daily.   Yes [provider]  pantoprazole (PROTONIX) 40 MG tablet Take 1 tablet by mouth daily. 06/06/17  Yes [provider]  timolol (TIMOPTIC) 0.5 % ophthalmic solution Place 1 drop into the right eye daily.  04/18/14  Yes [provider]  VITAMIN D, CHOLECALCIFEROL, PO Take 1 capsule by mouth daily.   Yes [provider]  brimonidine (ALPHAGAN) 0.2 % ophthalmic solution Place 1 drop into both eyes 2 (two) times daily.  Patient not taking: Reported on 03/08/2021 05/30/14   [provider]  diazepam (VALIUM) 5 MG tablet Take 0.5-1 tablets (2.5-5 mg total) by mouth every 8 (eight) hours as needed for muscle spasms. Patient not taking: Reported on 03/08/2021 06/29/17   Raeford RazorKohut, Stephen, MD  HYDROcodone-acetaminophen (NORCO/VICODIN) 5-325 MG tablet Take 1 tablet by mouth every 6 (six) hours as needed for moderate pain. Patient not taking: Reported on 03/08/2021     [provider]  linaclotide Karlene Einstein(LINZESS) 72 MCG capsule Take 72 mcg by mouth daily before breakfast. Patient not taking: Reported on 03/08/2021    [provider]  PROCTO-MED HC 2.5 % rectal cream Place 1 application rectally 2 (two) times daily. Patient not taking: Reported on 03/08/2021 06/10/17   [provider]    Physical Exam: Vitals:   03/08/21 1943 03/08/21 2000 03/08/21 2030 03/08/21 2100  BP:  136/76 (!) 150/79 (!) 141/78  Pulse: 86 85 87 87  Resp: 20 19 (!) 21 16  Temp:      TempSrc:      SpO2: 94% 96% 94% 97%  Weight:      Height:        Constitutional: Dry heaving, appears uncomfortable in pain Vitals:   03/08/21 1943 03/08/21 2000 03/08/21 2030 03/08/21 2100  BP:  136/76 (!) 150/79 (!) 141/78  Pulse: 86 85 87 87  Resp: 20 19 (!) 21 16  Temp:      TempSrc:      SpO2: 94% 96% 94% 97%  Weight:      Height:       Eyes: PERRL, lids and conjunctivae normal ENMT: Mucous membranes are dry  Neck: normal, supple, no masses, no thyromegaly Respiratory: clear to auscultation bilaterally, no wheezing, no crackles. Normal respiratory effort. No accessory muscle use.  Cardiovascular:  regular rate and rhythm, no murmurs / rubs / gallops. No extremity edema. 2+ pedal pulses.   Abdomen: no tenderness, no masses palpated. No hepatosplenomegaly. Bowel sounds positive.  Musculoskeletal: no clubbing / cyanosis. No joint deformity upper and lower extremities. Good ROM, no contractures. Normal muscle tone.  Skin: no rashes, lesions, ulcers. No induration Neurologic: No apparent cranial nerve abnormality, moving expressing spontaneously Psychiatric: Normal judgment and insight. Alert and oriented x 3. Normal mood.   Labs on Admission: I have personally reviewed following labs and imaging studies  CBC: Recent Labs  Lab 03/08/21 1928  WBC 11.2*  NEUTROABS 9.6*  HGB 13.4  HCT 41.3  MCV 97.6  PLT 223   Basic Metabolic Panel: Recent Labs  Lab  03/08/21 1928  NA 140  K 4.2  CL 106  CO2 26  GLUCOSE 134*  BUN 30*  CREATININE 0.87  CALCIUM 8.8*   GFR: Estimated Creatinine Clearance: 36.6 mL/min (by C-G formula based on SCr of 0.87 mg/dL). Liver Function Tests: Recent Labs  Lab 03/08/21 1928  AST 22  ALT 21  ALKPHOS 48  BILITOT 0.6  PROT 6.4*  ALBUMIN 4.0    Radiological Exams on Admission: CT Head Wo Contrast  Result Date: 03/08/2021 CLINICAL DATA:  Fall. Head trauma, intracranial arterial injury suspected EXAM: CT HEAD WITHOUT CONTRAST TECHNIQUE: Contiguous axial images were obtained from the base of the skull through the vertex without intravenous contrast. COMPARISON:  None. FINDINGS: Brain: No acute intracranial  abnormality. Specifically, no hemorrhage, hydrocephalus, mass lesion, acute infarction, or significant intracranial injury. Vascular: No hyperdense vessel or unexpected calcification. Skull: No acute calvarial abnormality. Sinuses/Orbits: No acute findings Other: None IMPRESSION: No acute intracranial abnormality. Electronically Signed   By: Charlett Nose M.D.   On: 03/08/2021 20:34   DG Hip Unilat W or Wo Pelvis 2-3 Views Left  Result Date: 03/08/2021 CLINICAL DATA:  Fall with pain. EXAM: DG HIP (WITH OR WITHOUT PELVIS) 2-3V LEFT COMPARISON:  None. FINDINGS: Displaced left femoral neck fracture. Fracture is mildly comminuted with proximal migration of the femoral shaft. Femoral head is well seated in the acetabulum. The bones are under mineralized. Pubic rami are grossly intact. Vertebroplasty within the lower lumbar spine. IMPRESSION: Displaced left femoral neck fracture. Electronically Signed   By: Narda Rutherford M.D.   On: 03/08/2021 19:48    EKG: Sinus rhythm rate 84, QTc 471.  No significant ST-T wave changes compared to prior EKG.  Assessment/Plan Principal Problem:   Displaced fracture of left femoral neck (HCC) Active Problems:   Acute respiratory failure with hypoxia (HCC)   Hypothyroidism    Hyperlipidemia   Displaced fracture of left femoral neck s/p mechanical fall-fracture as seen on pelvic x-ray.  Head CT without acute abnormality. - EDP talked to orthopedist on-call at [facility] Dr. Eulah Pont, recommended admission to [facility] plan for surgery tomorrow -N.p.o. midnight -IV Dilaudid 0.5mg  Q 4 hourly as needed -Obtain EKG, portable chest x-ray - 500 bolus, continue D5 N/s 75cc/hr x 20 hrs - IV Phenergan (dry heaving despite 4 mg of Zofran and metoclopramide)  Acute respiratory failure-likely secondary to narcotics, but chest x-ray showing left lung base airspace disease -atelectasis pneumonia or aspiration.  O2 sats down to 88% on room air, patient received 8 mg of morphine, subsequently received 2 mg of morphine and 0.5 mg of Dilaudid.  Slight leukocytosis of 11.2.rules out for sepsis.  -As much as possible judicious opiate use -Will treat with IV Unasyn and azithromycin for now -Check procalcitonin  Hypothyroidism -Resume Synthroid  GERD - Resume protonix  DVT prophylaxis: SCDS for now Code Status: Full code Family Communication: Son [name] at bedside, himself and his brother Nelma Rothman are patient's primary decision makers. Disposition Plan:  > 2 days Consults called: ortho Admission status: Inpatient, telemetry. I certify that at the point of admission it is my clinical judgment that the patient will require inpatient hospital care spanning beyond 2 midnights from the point of admission due to high intensity of service, high risk for further deterioration and high frequency of surveillance required.    Onnie Boer MD [facility]  03/08/2021, 10:00 PM

## 2021-03-08 NOTE — ED Triage Notes (Signed)
Pt had mechanical fall at home about 1730 this afternoon. Pt noted to have left leg shorting and severe pain with movement. Pt given 8mg  morphine and 4mg  zofran by EMS.

## 2021-03-08 NOTE — Progress Notes (Signed)
Pharmacy Antibiotic Note  Lori George is a 88 y.o. female admitted on 03/08/2021 with concern for aspiration pneumonia.  Pharmacy has been consulted for Unasyn dosing.  Plan: Unasyn 1.5g IV Q6H.  Height: 5\' 4"  (162.6 cm) Weight: 49.9 kg (110 lb) IBW/kg (Calculated) : 54.7  Temp (24hrs), Avg:98.3 F (36.8 C), Min:98.3 F (36.8 C), Max:98.3 F (36.8 C)  Recent Labs  Lab 03/08/21 1928  WBC 11.2*  CREATININE 0.87    Estimated Creatinine Clearance: 36.6 mL/min (by C-G formula based on SCr of 0.87 mg/dL).    No Known Allergies   Thank you for allowing pharmacy to be a part of this patients care.  05/06/21, PharmD, BCPS  03/08/2021 11:03 PM

## 2021-03-09 ENCOUNTER — Encounter (HOSPITAL_COMMUNITY)
Admission: EM | Disposition: A | Discharge status: Skilled Nursing Facility | Payer: Self-pay | Source: Home / Self Care | Attending: Internal Medicine

## 2021-03-09 ENCOUNTER — Encounter (HOSPITAL_COMMUNITY): Payer: Self-pay | Admitting: Internal Medicine

## 2021-03-09 ENCOUNTER — Encounter (HOSPITAL_COMMUNITY): Payer: Self-pay | Admitting: Certified Registered"

## 2021-03-09 ENCOUNTER — Other Ambulatory Visit: Payer: Self-pay

## 2021-03-09 DIAGNOSIS — J181 Lobar pneumonia, unspecified organism: Secondary | ICD-10-CM

## 2021-03-09 DIAGNOSIS — E039 Hypothyroidism, unspecified: Secondary | ICD-10-CM

## 2021-03-09 DIAGNOSIS — S72002A Fracture of unspecified part of neck of left femur, initial encounter for closed fracture: Secondary | ICD-10-CM | POA: Diagnosis present

## 2021-03-09 DIAGNOSIS — K219 Gastro-esophageal reflux disease without esophagitis: Secondary | ICD-10-CM

## 2021-03-09 LAB — CBC
HCT: 37.9 % (ref 36.0–46.0)
Hemoglobin: 12.2 g/dL (ref 12.0–15.0)
MCH: 31.9 pg (ref 26.0–34.0)
MCHC: 32.2 g/dL (ref 30.0–36.0)
MCV: 99.2 fL (ref 80.0–100.0)
Platelets: 195 10*3/uL (ref 150–400)
RBC: 3.82 MIL/uL — ABNORMAL LOW (ref 3.87–5.11)
RDW: 12.9 % (ref 11.5–15.5)
WBC: 12.3 10*3/uL — ABNORMAL HIGH (ref 4.0–10.5)
nRBC: 0 % (ref 0.0–0.2)

## 2021-03-09 LAB — BASIC METABOLIC PANEL
Anion gap: 6 (ref 5–15)
BUN: 28 mg/dL — ABNORMAL HIGH (ref 8–23)
CO2: 24 mmol/L (ref 22–32)
Calcium: 7.9 mg/dL — ABNORMAL LOW (ref 8.9–10.3)
Chloride: 110 mmol/L (ref 98–111)
Creatinine, Ser: 0.82 mg/dL (ref 0.44–1.00)
GFR, Estimated: >60 mL/min (ref >60)
Glucose, Bld: 189 mg/dL — ABNORMAL HIGH (ref 70–99)
Potassium: 4.4 mmol/L (ref 3.5–5.1)
Sodium: 140 mmol/L (ref 135–145)

## 2021-03-09 LAB — SURGICAL PCR SCREEN
MRSA, PCR: NEGATIVE
Staphylococcus aureus: NEGATIVE

## 2021-03-09 SURGERY — INSERTION, INTRAMEDULLARY ROD, FEMUR
Anesthesia: General | Site: Leg Upper | Laterality: Left

## 2021-03-09 MED ORDER — ACETAMINOPHEN 325 MG PO TABS
650.0000 mg | ORAL_TABLET | Freq: Four times a day (QID) | ORAL | Status: DC
  Administered 2021-03-09 – 2021-03-10 (×3): 650 mg via ORAL
  Filled 2021-03-09 (×3): qty 2

## 2021-03-09 MED ORDER — PROMETHAZINE HCL 25 MG/ML IJ SOLN
INTRAMUSCULAR | Status: AC
  Filled 2021-03-09: qty 1

## 2021-03-09 MED ORDER — ONDANSETRON HCL 4 MG/2ML IJ SOLN
4.0000 mg | Freq: Once | INTRAMUSCULAR | Status: AC
Start: 2021-03-09 — End: 2021-03-09

## 2021-03-09 MED ORDER — DEXAMETHASONE SODIUM PHOSPHATE 10 MG/ML IJ SOLN
8.0000 mg | Freq: Once | INTRAMUSCULAR | Status: AC
  Administered 2021-03-10: 8 mg via INTRAVENOUS

## 2021-03-09 MED ORDER — ONDANSETRON HCL 4 MG/2ML IJ SOLN
4.0000 mg | Freq: Four times a day (QID) | INTRAMUSCULAR | Status: DC | PRN
  Administered 2021-03-10 (×2): 4 mg via INTRAVENOUS
  Filled 2021-03-09: qty 2

## 2021-03-09 MED ORDER — OXYCODONE HCL 5 MG PO TABS
5.0000 mg | ORAL_TABLET | Freq: Once | ORAL | Status: AC | PRN
  Administered 2021-03-09: 5 mg via ORAL
  Filled 2021-03-09: qty 1

## 2021-03-09 MED ORDER — CEFAZOLIN SODIUM-DEXTROSE 2-4 GM/100ML-% IV SOLN
2.0000 g | INTRAVENOUS | Status: AC
  Administered 2021-03-10: 2 g via INTRAVENOUS
  Filled 2021-03-09: qty 100

## 2021-03-09 MED ORDER — POVIDONE-IODINE 10 % EX SWAB
2.0000 "application " | Freq: Once | CUTANEOUS | Status: AC
  Administered 2021-03-10: 2 via TOPICAL

## 2021-03-09 MED ORDER — ACETAMINOPHEN 500 MG PO TABS
1000.0000 mg | ORAL_TABLET | Freq: Once | ORAL | Status: AC
  Administered 2021-03-10: 1000 mg via ORAL
  Filled 2021-03-09: qty 2

## 2021-03-09 MED ORDER — TRAMADOL HCL 50 MG PO TABS
50.0000 mg | ORAL_TABLET | Freq: Four times a day (QID) | ORAL | Status: DC
  Administered 2021-03-09 – 2021-03-10 (×3): 50 mg via ORAL
  Filled 2021-03-09 (×3): qty 1

## 2021-03-09 MED ORDER — TRANEXAMIC ACID-NACL 1000-0.7 MG/100ML-% IV SOLN
1000.0000 mg | INTRAVENOUS | Status: AC
  Administered 2021-03-10: 1000 mg via INTRAVENOUS
  Filled 2021-03-09: qty 100

## 2021-03-09 MED ORDER — ENSURE PRE-SURGERY PO LIQD
296.0000 mL | Freq: Once | ORAL | Status: AC
  Administered 2021-03-10: 296 mL via ORAL
  Filled 2021-03-09: qty 296

## 2021-03-09 MED ORDER — ONDANSETRON HCL 4 MG/2ML IJ SOLN
INTRAMUSCULAR | Status: AC
  Administered 2021-03-09: 4 mg via INTRAVENOUS
  Filled 2021-03-09: qty 2

## 2021-03-09 NOTE — Progress Notes (Signed)
PROGRESS NOTE    Lori George  YOF:[id number] DOB: 19-Apr-1932 DOA: 03/08/2021 PCP: Kari Baars, MD    Chief Complaint  Patient presents with   Fall    Brief Narrative:  After H&P written by Dr. Mariea Clonts on 03/08/2021 Lori George is a 88 y.o. female with medical history significant for  stroke, thyroid disease.  Patient was brought to the ED with reports of a fall at home.  At the time of my evaluation patient is awake, but she is in severe pain and very hard of hearing so history is limited.  She tells me she feels ill.  Patient's son [name] is at bedside and assist with the history.  Patient lives alone, and is independent of all ADLs.  Earlier today patient had cooked lunch for heart family who came to visit.  She had no complaints.  Later after family had left, patient was in the kitchen cleaning, patient's son believes patient slipped and fell in the kitchen.  Patient subsequently called family on the phone, her sister and had 2 sons.   EMS gave 8 mg of morphine in route.   ED Course: O2 sats 88% on room air placed on 2 L sats 94 to 97%.  Temperature 98.3.  Heart rate 80s.  Respiratory rate 16-21.  Blood pressure systolic 130s to 668D.  Patient received another 2 mg of morphine, and 0.5 mg of Dilaudid in the ED still with persistent pain and discomfort. Pelvic x-ray shows a displaced left femoral neck fracture.  Head CT was without acute abnormality. EDP talked with Dr. Eulah Pont, recommended admission to [facility], plan for surgery tomorrow.  Assessment & Plan: 1-Displaced fracture of left femoral neck (HCC) -Left lower extremity history of other and externally rotated on exam. -Complaining of pain with any movement -No chest pain, no nausea, no vomiting. -Remains n.p.o.; planning to be transferred to [facility] long hospital for surgical intervention. -Dr. Eulah Pont orthopedic service in [place] waiting for her. -Continue as needed analgesics -Physical therapy evaluation after  surgical intervention to determine discharge pathway. -Continue supportive care and follow Ortho recommendations. -DVT prophylaxis to be decided by ortho service after surgical repair.  2-Hypothyroidism -Continue Synthroid  3-Hyperlipidemia -Continue statins.  4-Acute respiratory failure with hypoxia (HCC) -With atelectatic changes and need to be done at [employer] supplementation on presentation -There is concern for pneumonia -Continue current antibiotics for now -Follow clinical response -Flutter valve/incentive spirometer has been added. -Wean off oxygen supplementation as tolerated.  5-GERD -continue PPI.   DVT prophylaxis: SCD's Code Status: Full Family Communication: Son at bedside. Disposition:   Status is: Inpatient    Consultants:  Dr. Eulah Pont  Procedures:  Left hip surgery later today.  Antimicrobials:  Zithromax and Unasyn     Subjective: Complaining of left hip pain with any movement; left lower extremity and shorter and externally rotated; no chest pain, no nausea, no vomiting.  Good saturation on 2 L.  Objective: Vitals:   03/08/21 2230 03/09/21 0000 03/09/21 0400 03/09/21 0700  BP: (!) 155/76 128/75 (!) 143/68 (!) 143/68  Pulse: 89 90 89 87  Resp: 20 16 16  (!) 22  Temp:      TempSrc:      SpO2: 94% 91% 95% 93%  Weight:      Height:        Intake/Output Summary (Last 24 hours) at 03/09/2021 0819 Last data filed at 03/09/2021 0355 Gross per 24 hour  Intake 900 ml  Output --  Net 900 ml  Filed Weights   03/08/21 1906  Weight: 49.9 kg    Examination: General exam: complaining of pain in her left hip; no CP, no nausea, no vomiting, no palpitations or SOB complaints. Patient is afebrile.  Respiratory system: no using accessory muscles, good saturation on 2L [place] supplementation, positive rhonchi. Cardiovascular system: S1 & S2 heard, RRR. No JVD, murmurs, rubs, gallops or clicks. No pedal edema. Gastrointestinal system: Abdomen is nondistended,  soft and nontender. No organomegaly or masses felt. Normal bowel sounds heard. Central nervous system: No new focal neurological deficits. Extremities: left hip is shorter and externally rotated; no cyanosis, no clubbing. Skin: No petechiae. Psychiatry: Judgement and insight appear normal. Mood & affect appropriate.    Data Reviewed: I have personally reviewed following labs and imaging studies  CBC: Recent Labs  Lab 03/08/21 1928 03/09/21 0314  WBC 11.2* 12.3*  NEUTROABS 9.6*  --   HGB 13.4 12.2  HCT 41.3 37.9  MCV 97.6 99.2  PLT 223 195    Basic Metabolic Panel: Recent Labs  Lab 03/08/21 1928 03/09/21 0314  NA 140 140  K 4.2 4.4  CL 106 110  CO2 26 24  GLUCOSE 134* 189*  BUN 30* 28*  CREATININE 0.87 0.82  CALCIUM 8.8* 7.9*    GFR: Estimated Creatinine Clearance: 38.8 mL/min (by C-G formula based on SCr of 0.82 mg/dL).  Liver Function Tests: Recent Labs  Lab 03/08/21 1928  AST 22  ALT 21  ALKPHOS 48  BILITOT 0.6  PROT 6.4*  ALBUMIN 4.0    CBG: No results for input(s): GLUCAP in the last 168 hours.   Recent Results (from the past 240 hour(s))  Resp Panel by RT-PCR (Flu A&B, Covid) Nasopharyngeal Swab     Status: None   Collection Time: 03/08/21  7:13 PM   Specimen: Nasopharyngeal Swab; Nasopharyngeal(NP) swabs in vial transport medium  Result Value Ref Range Status   SARS Coronavirus 2 by RT PCR NEGATIVE NEGATIVE Final    Comment: (NOTE) SARS-CoV-2 target nucleic acids are NOT DETECTED.  The SARS-CoV-2 RNA is generally detectable in upper respiratory specimens during the acute phase of infection. The lowest concentration of SARS-CoV-2 viral copies this assay can detect is 138 copies/mL. A negative result does not preclude SARS-Cov-2 infection and should not be used as the sole basis for treatment or other patient management decisions. A negative result may occur with  improper specimen collection/handling, submission of specimen other than  nasopharyngeal swab, presence of viral mutation(s) within the areas targeted by this assay, and inadequate number of viral copies(<138 copies/mL). A negative result must be combined with clinical observations, patient history, and epidemiological information. The expected result is Negative.  Fact Sheet for Patients:  BloggerCourse.comhttps://www.fda.gov/media/152166/download  Fact Sheet for Healthcare Providers:  SeriousBroker.ithttps://www.fda.gov/media/152162/download  This test is no t yet approved or cleared by the [place] FDA and  has been authorized for detection and/or diagnosis of SARS-CoV-2 by FDA under an Emergency Use Authorization (EUA). This EUA will remain  in effect (meaning this test can be used) for the duration of the COVID-19 declaration under Section 564(b)(1) of the Act, 21 U.S.C.section 360bbb-3(b)(1), unless the authorization is terminated  or revoked sooner.       Influenza A by PCR NEGATIVE NEGATIVE Final   Influenza B by PCR NEGATIVE NEGATIVE Final    Comment: (NOTE) The Xpert Xpress SARS-CoV-2/FLU/RSV plus assay is intended as an aid in the diagnosis of influenza from Nasopharyngeal swab specimens and should not be used as a  sole basis for treatment. Nasal washings and aspirates are unacceptable for Xpert Xpress SARS-CoV-2/FLU/RSV testing.  Fact Sheet for Patients: BloggerCourse.com  Fact Sheet for Healthcare Providers: SeriousBroker.it  This test is not yet approved or cleared by the [place] FDA and has been authorized for detection and/or diagnosis of SARS-CoV-2 by FDA under an Emergency Use Authorization (EUA). This EUA will remain in effect (meaning this test can be used) for the duration of the COVID-19 declaration under Section 564(b)(1) of the Act, 21 U.S.C. section 360bbb-3(b)(1), unless the authorization is terminated or revoked.  Performed at [facility], [address] [place], [place] [address]       Radiology Studies: CT Head Wo Contrast  Result Date: 03/08/2021 CLINICAL DATA:  Fall. Head trauma, intracranial arterial injury suspected EXAM: CT HEAD WITHOUT CONTRAST TECHNIQUE: Contiguous axial images were obtained from the base of the skull through the vertex without intravenous contrast. COMPARISON:  None. FINDINGS: Brain: No acute intracranial abnormality. Specifically, no hemorrhage, hydrocephalus, mass lesion, acute infarction, or significant intracranial injury. Vascular: No hyperdense vessel or unexpected calcification. Skull: No acute calvarial abnormality. Sinuses/Orbits: No acute findings Other: None IMPRESSION: No acute intracranial abnormality. Electronically Signed   By: Charlett Nose M.D.   On: 03/08/2021 20:34   DG CHEST PORT 1 VIEW  Result Date: 03/08/2021 CLINICAL DATA:  Preop hip fracture EXAM: PORTABLE CHEST 1 VIEW COMPARISON:  CT 05/05/2017, chest x-ray 05/05/2017, 07/17/2014 FINDINGS: Mild cardiomegaly. Mild diffuse coarse and slightly reticular interstitial opacity suggesting chronic disease. Patchy airspace opacity at the left base. Stable cardiomediastinal silhouette with aortic atherosclerosis. No visible pneumothorax. Multiple treated compression deformities of the spine. IMPRESSION: Airspace disease at the left lung base may reflect atelectasis, pneumonia or aspiration. Electronically Signed   By: Jasmine Pang M.D.   On: 03/08/2021 21:49   DG Hip Unilat W or Wo Pelvis 2-3 Views Left  Result Date: 03/08/2021 CLINICAL DATA:  Fall with pain. EXAM: DG HIP (WITH OR WITHOUT PELVIS) 2-3V LEFT COMPARISON:  None. FINDINGS: Displaced left femoral neck fracture. Fracture is mildly comminuted with proximal migration of the femoral shaft. Femoral head is well seated in the acetabulum. The bones are under mineralized. Pubic rami are grossly intact. Vertebroplasty within the lower lumbar spine. IMPRESSION: Displaced left femoral neck fracture. Electronically Signed   By: Narda Rutherford  M.D.   On: 03/08/2021 19:48     Scheduled Meds:  aspirin EC  81 mg Oral Daily   levothyroxine  75 mcg Oral QAC breakfast   pantoprazole  40 mg Oral Daily   Continuous Infusions:  ampicillin-sulbactam (UNASYN) IV Stopped (03/09/21 0041)   azithromycin Stopped (03/08/21 2334)   dextrose 5 % and 0.9% NaCl 75 mL/hr at 03/08/21 2335   promethazine (PHENERGAN) injection (IM or IVPB) Stopped (03/09/21 0355)     LOS: 1 day    Vassie Loll, MD [facility]   To contact the attending provider between 7A-7P or the covering provider during after hours 7P-7A, please log into the web site [email or web address] and access using universal [place] password for that web site. If you do not have the password, please call the hospital operator.  03/09/2021, 8:19 AM

## 2021-03-09 NOTE — Plan of Care (Signed)
°  Problem: Education: Goal: Knowledge of General Education information will improve Description: Including pain rating scale, medication(s)/side effects and non-pharmacologic comfort measures 03/09/2021 1755 by Arthor Captain, RN Outcome: Progressing 03/09/2021 1755 by Arthor Captain, RN Outcome: Progressing 03/09/2021 1755 by Arthor Captain, RN Outcome: Progressing   PATIENT IS WAITING ON SURGERY IN THE AM.

## 2021-03-09 NOTE — ED Notes (Signed)
Peri-care done, attends applied, and repositioned in bed.  Family at bedside.

## 2021-03-09 NOTE — H&P (View-Only) (Signed)
ORTHOPAEDIC CONSULTATION  REQUESTING PHYSICIAN: Barton Dubois, MD  Chief Complaint: left hip pain after a fall  HPI: Lori George is a 88 y.o. female with a history of a stroke and hypothyroidism who complains of left hip pain after falling at home yesterday. She is not exactly sure if she slipped or tripped over something. She was unable to get up from the floor and had to call for help.   Imaging shows displaced left femoral neck fracture.    Orthopedics was consulted for evaluation.   Last meal before midnight. No history of MI, DVT, PE. + h/o CVA, on aspirin. Previously ambulatory without the use of assistive devices.  The patient is living on her own at home.    Past Medical History:  Diagnosis Date   Glaucoma    Stroke [facility])    Thyroid disease    Past Surgical History:  Procedure Laterality Date   BACK SURGERY     IR FLUORO GUIDED NEEDLE PLC ASPIRATION/INJECTION LOC  06/17/2017   IR KYPHO EA ADDL LEVEL THORACIC OR LUMBAR  06/17/2017   IR KYPHO THORACIC WITH BONE BIOPSY  06/17/2017   IR RADIOLOGIST EVAL & MGMT  06/13/2017   Social History   Socioeconomic History   Marital status: Widowed    Spouse name: Not on file   Number of children: Not on file   Years of education: Not on file   Highest education level: Not on file  Occupational History   Not on file  Tobacco Use   Smoking status: Never   Smokeless tobacco: Never  Vaping Use   Vaping Use: Never used  Substance and Sexual Activity   Alcohol use: No   Drug use: No   Sexual activity: Not on file  Other Topics Concern   Not on file  Social History Narrative   Not on file   Social Determinants of Health   Financial Resource Strain: Not on file  Food Insecurity: Not on file  Transportation Needs: Not on file  Physical Activity: Not on file  Stress: Not on file  Social Connections: Not on file   History reviewed. No pertinent family history. No Known Allergies Prior to Admission medications    Medication Sig Start Date End Date Taking? Authorizing Provider  acetaminophen (TYLENOL) 500 MG tablet Take 500-1,000 mg by mouth every 6 (six) hours as needed for mild pain or moderate pain.   Yes [provider]  aspirin EC 81 MG tablet Take 81 mg by mouth daily.   Yes [provider]  calcium carbonate (TUMS - DOSED IN MG ELEMENTAL CALCIUM) 500 MG chewable tablet Chew 1 tablet by mouth daily as needed for heartburn.    Yes [provider]  CALCIUM PO Take 1 tablet by mouth daily.   Yes [provider]  Levothyroxine Sodium 75 MCG CAPS Take 75 mcg by mouth daily before breakfast.   Yes [provider]  Multiple Vitamins-Minerals (MULTIVITAMIN WITH MINERALS) tablet Take 1 tablet by mouth daily.   Yes [provider]  pantoprazole (PROTONIX) 40 MG tablet Take 1 tablet by mouth daily. 06/06/17  Yes [provider]  timolol (TIMOPTIC) 0.5 % ophthalmic solution Place 1 drop into the right eye daily.  04/18/14  Yes [provider]  VITAMIN D, CHOLECALCIFEROL, PO Take 1 capsule by mouth daily.   Yes [provider]  brimonidine (ALPHAGAN) 0.2 % ophthalmic solution Place 1 drop into both eyes 2 (two) times daily.  Patient not taking: Reported on 03/08/2021 05/30/14   [provider]  diazepam (VALIUM) 5 MG tablet Take 0.5-1 tablets (2.5-5 mg total) by mouth every 8 (eight) hours as needed for muscle spasms. Patient not taking: Reported on 03/08/2021 06/29/17   Virgel Manifold, MD  HYDROcodone-acetaminophen (NORCO/VICODIN) 5-325 MG tablet Take 1 tablet by mouth every 6 (six) hours as needed for moderate pain. Patient not taking: Reported on 03/08/2021    [provider]  linaclotide Rolan Lipa) 72 MCG capsule Take 72 mcg by mouth daily before breakfast. Patient not taking: Reported on 03/08/2021    [provider]  PROCTO-MED HC 2.5 % rectal cream Place 1 application rectally 2 (two) times daily. Patient not  taking: Reported on 03/08/2021 06/10/17   [provider]   CT Head Wo Contrast  Result Date: 03/08/2021 CLINICAL DATA:  Fall. Head trauma, intracranial arterial injury suspected EXAM: CT HEAD WITHOUT CONTRAST TECHNIQUE: Contiguous axial images were obtained from the base of the skull through the vertex without intravenous contrast. COMPARISON:  None. FINDINGS: Brain: No acute intracranial abnormality. Specifically, no hemorrhage, hydrocephalus, mass lesion, acute infarction, or significant intracranial injury. Vascular: No hyperdense vessel or unexpected calcification. Skull: No acute calvarial abnormality. Sinuses/Orbits: No acute findings Other: None IMPRESSION: No acute intracranial abnormality. Electronically Signed   By: Rolm Baptise M.D.   On: 03/08/2021 20:34   DG CHEST PORT 1 VIEW  Result Date: 03/08/2021 CLINICAL DATA:  Preop hip fracture EXAM: PORTABLE CHEST 1 VIEW COMPARISON:  CT 05/05/2017, chest x-ray 05/05/2017, 07/17/2014 FINDINGS: Mild cardiomegaly. Mild diffuse coarse and slightly reticular interstitial opacity suggesting chronic disease. Patchy airspace opacity at the left base. Stable cardiomediastinal silhouette with aortic atherosclerosis. No visible pneumothorax. Multiple treated compression deformities of the spine. IMPRESSION: Airspace disease at the left lung base may reflect atelectasis, pneumonia or aspiration. Electronically Signed   By: Donavan Foil M.D.   On: 03/08/2021 21:49   DG Hip Unilat W or Wo Pelvis 2-3 Views Left  Result Date: 03/08/2021 CLINICAL DATA:  Fall with pain. EXAM: DG HIP (WITH OR WITHOUT PELVIS) 2-3V LEFT COMPARISON:  None. FINDINGS: Displaced left femoral neck fracture. Fracture is mildly comminuted with proximal migration of the femoral shaft. Femoral head is well seated in the acetabulum. The bones are under mineralized. Pubic rami are grossly intact. Vertebroplasty within the lower lumbar spine. IMPRESSION: Displaced left femoral neck fracture.  Electronically Signed   By: Keith Rake M.D.   On: 03/08/2021 19:48    Positive ROS: All other systems have been reviewed and were otherwise negative with the exception of those mentioned in the HPI and as above.  Objective: Labs cbc Recent Labs    03/08/21 1928 03/09/21 0314  WBC 11.2* 12.3*  HGB 13.4 12.2  HCT 41.3 37.9  PLT 223 195    Labs inflam No results for input(s): CRP in the last 72 hours.  Invalid input(s): ESR  Labs coag No results for input(s): INR, PTT in the last 72 hours.  Invalid input(s): PT  Recent Labs    03/08/21 1928 03/09/21 0314  NA 140 140  K 4.2 4.4  CL 106 110  CO2 26 24  GLUCOSE 134* 189*  BUN 30* 28*  CREATININE 0.87 0.82  CALCIUM 8.8* 7.9*    Physical Exam: Vitals:   03/09/21 1200 03/09/21 1300  BP: 121/61   Pulse: 84 79  Resp: (!) 24 (!) 25  Temp: 98.3 F (36.8 C)   SpO2: 94% 96%   General:  Alert, no acute distress.  Laying in bed, calm. VERY HOH Mental status: Alert and Oriented x3 Neurologic: Speech Clear and organized, no gross focal findings or movement disorder appreciated. Respiratory: No cyanosis, no use of accessory musculature Cardiovascular: No pedal edema GI: Abdomen is soft and non-tender, non-distended. Skin: Warm and dry.  No lesions in the area of chief complaint. Extremities: Warm and well perfused w/o edema Psychiatric: Patient is competent for consent with normal mood and affect  MUSCULOSKELETAL:  TTP left hip, no erythema or ecchymosis seen, left leg is slightly shortened and externally rotated, ROM left hip very limited d/t pain, ROM left knee and ankle intact, NVI Other extremities are atraumatic with painless ROM and NVI.  Assessment / Plan: Principal Problem:   Displaced fracture of left femoral neck (HCC) Active Problems:   Hypothyroidism   Hyperlipidemia   Acute respiratory failure with hypoxia (HCC)   Closed fracture of neck of left femur (HCC)     Will plan for patient to be  transferred from [facility] to [facility] for a left hip hemiarthroplasty on 03/10/21 by Dr. Percell Miller. Order placed for her to be NPO at midnight.    Weightbearing:  pre-op NWB LLE; post-op WBAT LLE   Insicional and dressing care: post op - Reinforce dressings as needed Orthopedic device(s): None Showering: POD 3 VTE prophylaxis:  normally takes ASA 42m daily, will hold for now, post-op will order Lovenox while inpatient     Pain control: will add Tylenol and Tramadol Follow - up plan: 2 weeks post-op Contact information:  TEdmonia LynchMD, MWilliam P. Clements Jr. University HospitalPA-C     MBritt BottomPA-C Office [phone]/10/2021 4:07 PM

## 2021-03-09 NOTE — ED Notes (Signed)
Pt urinated x 1, pericare performed adult diaper changed.

## 2021-03-09 NOTE — Consult Note (Signed)
ORTHOPAEDIC CONSULTATION  REQUESTING PHYSICIAN: Barton Dubois, MD  Chief Complaint: left hip pain after a fall  HPI: Lori George is a 88 y.o. female with a history of a stroke and hypothyroidism who complains of left hip pain after falling at home yesterday. She is not exactly sure if she slipped or tripped over something. She was unable to get up from the floor and had to call for help.   Imaging shows displaced left femoral neck fracture.    Orthopedics was consulted for evaluation.   Last meal before midnight. No history of MI, DVT, PE. + h/o CVA, on aspirin. Previously ambulatory without the use of assistive devices.  The patient is living on her own at home.    Past Medical History:  Diagnosis Date   Glaucoma    Stroke [facility])    Thyroid disease    Past Surgical History:  Procedure Laterality Date   BACK SURGERY     IR FLUORO GUIDED NEEDLE PLC ASPIRATION/INJECTION LOC  06/17/2017   IR KYPHO EA ADDL LEVEL THORACIC OR LUMBAR  06/17/2017   IR KYPHO THORACIC WITH BONE BIOPSY  06/17/2017   IR RADIOLOGIST EVAL & MGMT  06/13/2017   Social History   Socioeconomic History   Marital status: Widowed    Spouse name: Not on file   Number of children: Not on file   Years of education: Not on file   Highest education level: Not on file  Occupational History   Not on file  Tobacco Use   Smoking status: Never   Smokeless tobacco: Never  Vaping Use   Vaping Use: Never used  Substance and Sexual Activity   Alcohol use: No   Drug use: No   Sexual activity: Not on file  Other Topics Concern   Not on file  Social History Narrative   Not on file   Social Determinants of Health   Financial Resource Strain: Not on file  Food Insecurity: Not on file  Transportation Needs: Not on file  Physical Activity: Not on file  Stress: Not on file  Social Connections: Not on file   History reviewed. No pertinent family history. No Known Allergies Prior to Admission medications    Medication Sig Start Date End Date Taking? Authorizing Provider  acetaminophen (TYLENOL) 500 MG tablet Take 500-1,000 mg by mouth every 6 (six) hours as needed for mild pain or moderate pain.   Yes [provider]  aspirin EC 81 MG tablet Take 81 mg by mouth daily.   Yes [provider]  calcium carbonate (TUMS - DOSED IN MG ELEMENTAL CALCIUM) 500 MG chewable tablet Chew 1 tablet by mouth daily as needed for heartburn.    Yes [provider]  CALCIUM PO Take 1 tablet by mouth daily.   Yes [provider]  Levothyroxine Sodium 75 MCG CAPS Take 75 mcg by mouth daily before breakfast.   Yes [provider]  Multiple Vitamins-Minerals (MULTIVITAMIN WITH MINERALS) tablet Take 1 tablet by mouth daily.   Yes [provider]  pantoprazole (PROTONIX) 40 MG tablet Take 1 tablet by mouth daily. 06/06/17  Yes [provider]  timolol (TIMOPTIC) 0.5 % ophthalmic solution Place 1 drop into the right eye daily.  04/18/14  Yes [provider]  VITAMIN D, CHOLECALCIFEROL, PO Take 1 capsule by mouth daily.   Yes [provider]  brimonidine (ALPHAGAN) 0.2 % ophthalmic solution Place 1 drop into both eyes 2 (two) times daily.  Patient not taking: Reported on 03/08/2021 05/30/14   [provider]  diazepam (VALIUM) 5 MG tablet Take 0.5-1 tablets (2.5-5 mg total) by mouth every 8 (eight) hours as needed for muscle spasms. Patient not taking: Reported on 03/08/2021 06/29/17   Virgel Manifold, MD  HYDROcodone-acetaminophen (NORCO/VICODIN) 5-325 MG tablet Take 1 tablet by mouth every 6 (six) hours as needed for moderate pain. Patient not taking: Reported on 03/08/2021    [provider]  linaclotide Rolan Lipa) 72 MCG capsule Take 72 mcg by mouth daily before breakfast. Patient not taking: Reported on 03/08/2021    [provider]  PROCTO-MED HC 2.5 % rectal cream Place 1 application rectally 2 (two) times daily. Patient not  taking: Reported on 03/08/2021 06/10/17   [provider]   CT Head Wo Contrast  Result Date: 03/08/2021 CLINICAL DATA:  Fall. Head trauma, intracranial arterial injury suspected EXAM: CT HEAD WITHOUT CONTRAST TECHNIQUE: Contiguous axial images were obtained from the base of the skull through the vertex without intravenous contrast. COMPARISON:  None. FINDINGS: Brain: No acute intracranial abnormality. Specifically, no hemorrhage, hydrocephalus, mass lesion, acute infarction, or significant intracranial injury. Vascular: No hyperdense vessel or unexpected calcification. Skull: No acute calvarial abnormality. Sinuses/Orbits: No acute findings Other: None IMPRESSION: No acute intracranial abnormality. Electronically Signed   By: Rolm Baptise M.D.   On: 03/08/2021 20:34   DG CHEST PORT 1 VIEW  Result Date: 03/08/2021 CLINICAL DATA:  Preop hip fracture EXAM: PORTABLE CHEST 1 VIEW COMPARISON:  CT 05/05/2017, chest x-ray 05/05/2017, 07/17/2014 FINDINGS: Mild cardiomegaly. Mild diffuse coarse and slightly reticular interstitial opacity suggesting chronic disease. Patchy airspace opacity at the left base. Stable cardiomediastinal silhouette with aortic atherosclerosis. No visible pneumothorax. Multiple treated compression deformities of the spine. IMPRESSION: Airspace disease at the left lung base may reflect atelectasis, pneumonia or aspiration. Electronically Signed   By: Donavan Foil M.D.   On: 03/08/2021 21:49   DG Hip Unilat W or Wo Pelvis 2-3 Views Left  Result Date: 03/08/2021 CLINICAL DATA:  Fall with pain. EXAM: DG HIP (WITH OR WITHOUT PELVIS) 2-3V LEFT COMPARISON:  None. FINDINGS: Displaced left femoral neck fracture. Fracture is mildly comminuted with proximal migration of the femoral shaft. Femoral head is well seated in the acetabulum. The bones are under mineralized. Pubic rami are grossly intact. Vertebroplasty within the lower lumbar spine. IMPRESSION: Displaced left femoral neck fracture.  Electronically Signed   By: Keith Rake M.D.   On: 03/08/2021 19:48    Positive ROS: All other systems have been reviewed and were otherwise negative with the exception of those mentioned in the HPI and as above.  Objective: Labs cbc Recent Labs    03/08/21 1928 03/09/21 0314  WBC 11.2* 12.3*  HGB 13.4 12.2  HCT 41.3 37.9  PLT 223 195    Labs inflam No results for input(s): CRP in the last 72 hours.  Invalid input(s): ESR  Labs coag No results for input(s): INR, PTT in the last 72 hours.  Invalid input(s): PT  Recent Labs    03/08/21 1928 03/09/21 0314  NA 140 140  K 4.2 4.4  CL 106 110  CO2 26 24  GLUCOSE 134* 189*  BUN 30* 28*  CREATININE 0.87 0.82  CALCIUM 8.8* 7.9*    Physical Exam: Vitals:   03/09/21 1200 03/09/21 1300  BP: 121/61   Pulse: 84 79  Resp: (!) 24 (!) 25  Temp: 98.3 F (36.8 C)   SpO2: 94% 96%   General:  Alert, no acute distress.  Laying in bed, calm. VERY HOH Mental status: Alert and Oriented x3 Neurologic: Speech Clear and organized, no gross focal findings or movement disorder appreciated. Respiratory: No cyanosis, no use of accessory musculature Cardiovascular: No pedal edema GI: Abdomen is soft and non-tender, non-distended. Skin: Warm and dry.  No lesions in the area of chief complaint. Extremities: Warm and well perfused w/o edema Psychiatric: Patient is competent for consent with normal mood and affect  MUSCULOSKELETAL:  TTP left hip, no erythema or ecchymosis seen, left leg is slightly shortened and externally rotated, ROM left hip very limited d/t pain, ROM left knee and ankle intact, NVI Other extremities are atraumatic with painless ROM and NVI.  Assessment / Plan: Principal Problem:   Displaced fracture of left femoral neck (HCC) Active Problems:   Hypothyroidism   Hyperlipidemia   Acute respiratory failure with hypoxia (HCC)   Closed fracture of neck of left femur (HCC)     Will plan for patient to be  transferred from [facility] to [facility] for a left hip hemiarthroplasty on 03/10/21 by Dr. Percell Miller. Order placed for her to be NPO at midnight.    Weightbearing:  pre-op NWB LLE; post-op WBAT LLE   Insicional and dressing care: post op - Reinforce dressings as needed Orthopedic device(s): None Showering: POD 3 VTE prophylaxis:  normally takes ASA 42m daily, will hold for now, post-op will order Lovenox while inpatient     Pain control: will add Tylenol and Tramadol Follow - up plan: 2 weeks post-op Contact information:  TEdmonia LynchMD, MWilliam P. Clements Jr. University HospitalPA-C     MBritt BottomPA-C Office [phone]/10/2021 4:07 PM

## 2021-03-09 NOTE — Plan of Care (Signed)
  Problem: Education: Goal: Knowledge of General Education information will improve Description: Including pain rating scale, medication(s)/side effects and non-pharmacologic comfort measures Outcome: Progressing   Problem: Clinical Measurements: Goal: Will remain free from infection Outcome: Progressing   

## 2021-03-09 NOTE — ED Notes (Signed)
Dr Madera at bedside  

## 2021-03-10 ENCOUNTER — Encounter (HOSPITAL_COMMUNITY): Payer: Self-pay | Admitting: Internal Medicine

## 2021-03-10 ENCOUNTER — Inpatient Hospital Stay (HOSPITAL_COMMUNITY): Payer: Medicare Other

## 2021-03-10 ENCOUNTER — Inpatient Hospital Stay (HOSPITAL_COMMUNITY): Payer: Medicare Other | Admitting: Certified Registered Nurse Anesthetist

## 2021-03-10 ENCOUNTER — Encounter (HOSPITAL_COMMUNITY)
Admission: EM | Disposition: A | Discharge status: Skilled Nursing Facility | Payer: Self-pay | Source: Home / Self Care | Attending: Internal Medicine

## 2021-03-10 HISTORY — PX: HIP ARTHROPLASTY: SHX981

## 2021-03-10 SURGERY — HEMIARTHROPLASTY, HIP, DIRECT ANTERIOR APPROACH, FOR FRACTURE
Anesthesia: Spinal | Site: Hip | Laterality: Left

## 2021-03-10 MED ORDER — METHOCARBAMOL 500 MG PO TABS
500.0000 mg | ORAL_TABLET | Freq: Four times a day (QID) | ORAL | Status: DC | PRN
  Administered 2021-03-12 – 2021-03-13 (×2): 500 mg via ORAL
  Filled 2021-03-10 (×2): qty 1

## 2021-03-10 MED ORDER — PHENOL 1.4 % MT LIQD: 1.0000 | OROMUCOSAL | Status: DC | PRN

## 2021-03-10 MED ORDER — DOCUSATE SODIUM 100 MG PO CAPS
100.0000 mg | ORAL_CAPSULE | Freq: Two times a day (BID) | ORAL | Status: DC
  Administered 2021-03-11 – 2021-03-13 (×6): 100 mg via ORAL
  Filled 2021-03-10 (×6): qty 1

## 2021-03-10 MED ORDER — LIDOCAINE HCL (PF) 2 % IJ SOLN
INTRAMUSCULAR | Status: AC
  Filled 2021-03-10: qty 5

## 2021-03-10 MED ORDER — METOCLOPRAMIDE HCL 5 MG/ML IJ SOLN: 5.0000 mg | Freq: Three times a day (TID) | INTRAMUSCULAR | Status: DC | PRN

## 2021-03-10 MED ORDER — BUPIVACAINE IN DEXTROSE 0.75-8.25 % IT SOLN
INTRATHECAL | Status: DC | PRN
  Administered 2021-03-10: 1.6 mL via INTRATHECAL

## 2021-03-10 MED ORDER — ACETAMINOPHEN 500 MG PO TABS
500.0000 mg | ORAL_TABLET | Freq: Four times a day (QID) | ORAL | Status: AC
  Administered 2021-03-10 – 2021-03-11 (×4): 500 mg via ORAL
  Filled 2021-03-10 (×4): qty 1

## 2021-03-10 MED ORDER — 0.9 % SODIUM CHLORIDE (POUR BTL) OPTIME
TOPICAL | Status: DC | PRN
  Administered 2021-03-10: 1000 mL

## 2021-03-10 MED ORDER — TRAMADOL HCL 50 MG PO TABS
50.0000 mg | ORAL_TABLET | Freq: Four times a day (QID) | ORAL | Status: DC
  Administered 2021-03-10 – 2021-03-13 (×6): 50 mg via ORAL
  Filled 2021-03-10 (×6): qty 1

## 2021-03-10 MED ORDER — LACTATED RINGERS IV SOLN: INTRAVENOUS | Status: DC | PRN

## 2021-03-10 MED ORDER — ONDANSETRON HCL 4 MG/2ML IJ SOLN
4.0000 mg | Freq: Four times a day (QID) | INTRAMUSCULAR | Status: DC | PRN
  Administered 2021-03-11: 4 mg via INTRAVENOUS
  Filled 2021-03-10: qty 2

## 2021-03-10 MED ORDER — ONDANSETRON HCL 4 MG/2ML IJ SOLN
INTRAMUSCULAR | Status: AC
  Filled 2021-03-10: qty 2

## 2021-03-10 MED ORDER — ENOXAPARIN SODIUM 30 MG/0.3ML IJ SOSY
30.0000 mg | PREFILLED_SYRINGE | INTRAMUSCULAR | Status: DC
  Administered 2021-03-11 – 2021-03-13 (×3): 30 mg via SUBCUTANEOUS
  Filled 2021-03-10 (×3): qty 0.3

## 2021-03-10 MED ORDER — CEFAZOLIN SODIUM-DEXTROSE 2-4 GM/100ML-% IV SOLN: 2.0000 g | Freq: Four times a day (QID) | INTRAVENOUS | Status: DC

## 2021-03-10 MED ORDER — HYDROCODONE-ACETAMINOPHEN 7.5-325 MG PO TABS: 1.0000 | ORAL_TABLET | ORAL | Status: DC | PRN

## 2021-03-10 MED ORDER — MENTHOL 3 MG MT LOZG: 1.0000 | LOZENGE | OROMUCOSAL | Status: DC | PRN

## 2021-03-10 MED ORDER — ACETAMINOPHEN 325 MG PO TABS: 325.0000 mg | ORAL_TABLET | Freq: Four times a day (QID) | ORAL | Status: DC | PRN

## 2021-03-10 MED ORDER — ADULT MULTIVITAMIN W/MINERALS CH
1.0000 | ORAL_TABLET | Freq: Every day | ORAL | Status: DC
  Administered 2021-03-11 – 2021-03-13 (×3): 1 via ORAL
  Filled 2021-03-10 (×4): qty 1

## 2021-03-10 MED ORDER — BUPIVACAINE LIPOSOME 1.3 % IJ SUSP
INTRAMUSCULAR | Status: DC | PRN
  Administered 2021-03-10: 20 mL

## 2021-03-10 MED ORDER — MORPHINE SULFATE (PF) 2 MG/ML IV SOLN: 0.5000 mg | INTRAVENOUS | Status: DC | PRN

## 2021-03-10 MED ORDER — PROPOFOL 500 MG/50ML IV EMUL
INTRAVENOUS | Status: DC | PRN
  Administered 2021-03-10: 50 ug/kg/min via INTRAVENOUS

## 2021-03-10 MED ORDER — SODIUM CHLORIDE 0.9% FLUSH
INTRAVENOUS | Status: DC | PRN
  Administered 2021-03-10: 30 mL

## 2021-03-10 MED ORDER — METOCLOPRAMIDE HCL 5 MG PO TABS: 5.0000 mg | ORAL_TABLET | Freq: Three times a day (TID) | ORAL | Status: DC | PRN

## 2021-03-10 MED ORDER — TRANEXAMIC ACID-NACL 1000-0.7 MG/100ML-% IV SOLN
1000.0000 mg | Freq: Once | INTRAVENOUS | Status: AC
  Administered 2021-03-10: 1000 mg via INTRAVENOUS
  Filled 2021-03-10: qty 100

## 2021-03-10 MED ORDER — NAPROXEN 250 MG PO TABS
250.0000 mg | ORAL_TABLET | Freq: Two times a day (BID) | ORAL | Status: DC
  Administered 2021-03-10 – 2021-03-13 (×7): 250 mg via ORAL
  Filled 2021-03-10 (×9): qty 1

## 2021-03-10 MED ORDER — PROPOFOL 1000 MG/100ML IV EMUL
INTRAVENOUS | Status: AC
  Filled 2021-03-10: qty 100

## 2021-03-10 MED ORDER — PROPOFOL 10 MG/ML IV BOLUS
INTRAVENOUS | Status: DC | PRN
  Administered 2021-03-10: 10 mg via INTRAVENOUS
  Administered 2021-03-10: 20 mg via INTRAVENOUS

## 2021-03-10 MED ORDER — ONDANSETRON HCL 4 MG PO TABS: 4.0000 mg | ORAL_TABLET | Freq: Four times a day (QID) | ORAL | Status: DC | PRN

## 2021-03-10 MED ORDER — PHENYLEPHRINE HCL-NACL 20-0.9 MG/250ML-% IV SOLN
INTRAVENOUS | Status: DC | PRN
  Administered 2021-03-10: 40 ug/min via INTRAVENOUS

## 2021-03-10 MED ORDER — LIDOCAINE 2% (20 MG/ML) 5 ML SYRINGE
INTRAMUSCULAR | Status: DC | PRN
  Administered 2021-03-10: 40 mg via INTRAVENOUS

## 2021-03-10 MED ORDER — HYDROCODONE-ACETAMINOPHEN 5-325 MG PO TABS: 1.0000 | ORAL_TABLET | ORAL | Status: DC | PRN

## 2021-03-10 MED ORDER — STERILE WATER FOR IRRIGATION IR SOLN
Status: DC | PRN
  Administered 2021-03-10: 2000 mL

## 2021-03-10 MED ORDER — BUPIVACAINE LIPOSOME 1.3 % IJ SUSP
INTRAMUSCULAR | Status: AC
  Filled 2021-03-10: qty 20

## 2021-03-10 MED ORDER — SODIUM CHLORIDE 0.9 % IV SOLN: INTRAVENOUS | Status: DC | PRN

## 2021-03-10 MED ORDER — SODIUM CHLORIDE (PF) 0.9 % IJ SOLN
INTRAMUSCULAR | Status: AC
  Filled 2021-03-10: qty 20

## 2021-03-10 MED ORDER — ALUM & MAG HYDROXIDE-SIMETH 200-200-20 MG/5ML PO SUSP: 30.0000 mL | ORAL | Status: DC | PRN

## 2021-03-10 MED ORDER — BISACODYL 10 MG RE SUPP: 10.0000 mg | Freq: Every day | RECTAL | Status: DC | PRN

## 2021-03-10 MED ORDER — DEXAMETHASONE SODIUM PHOSPHATE 10 MG/ML IJ SOLN
INTRAMUSCULAR | Status: AC
  Filled 2021-03-10: qty 1

## 2021-03-10 MED ORDER — METHOCARBAMOL 500 MG IVPB - SIMPLE MED
500.0000 mg | Freq: Four times a day (QID) | INTRAVENOUS | Status: DC | PRN
  Filled 2021-03-10: qty 50

## 2021-03-10 MED ORDER — FENTANYL CITRATE PF 50 MCG/ML IJ SOSY: 25.0000 ug | PREFILLED_SYRINGE | INTRAMUSCULAR | Status: DC | PRN

## 2021-03-10 MED ORDER — CHLORHEXIDINE GLUCONATE CLOTH 2 % EX PADS
6.0000 | MEDICATED_PAD | Freq: Every day | CUTANEOUS | Status: DC
  Administered 2021-03-13: 6 via TOPICAL

## 2021-03-10 SURGICAL SUPPLY — 54 items
BAG COUNTER SPONGE SURGICOUNT (BAG) ×2 IMPLANT
BIT DRILL 2.0X128 (BIT) ×2 IMPLANT
BLADE SAGITTAL 25.0X1.27X90 (BLADE) ×2 IMPLANT
CLSR STERI-STRIP ANTIMIC 1/2X4 (GAUZE/BANDAGES/DRESSINGS) ×3 IMPLANT
COVER SURGICAL LIGHT HANDLE (MISCELLANEOUS) ×2 IMPLANT
DRAPE ORTHO SPLIT 77X108 STRL (DRAPES) ×4
DRAPE SURG ORHT 6 SPLT 77X108 (DRAPES) ×2 IMPLANT
DRAPE U-SHAPE 47X51 STRL (DRAPES) ×2 IMPLANT
DRSG MEPILEX BORDER 4X8 (GAUZE/BANDAGES/DRESSINGS) ×2 IMPLANT
DURAPREP 26ML APPLICATOR (WOUND CARE) ×2 IMPLANT
ELECT BLADE TIP CTD 4 INCH (ELECTRODE) ×2 IMPLANT
ELECT CAUTERY BLADE TIP 2.5 (TIP)
ELECT PENCIL ROCKER SW 15FT (MISCELLANEOUS) ×1 IMPLANT
ELECT REM PT RETURN 15FT ADLT (MISCELLANEOUS) ×2 IMPLANT
ELECTRODE CAUTERY BLDE TIP 2.5 (TIP) ×1 IMPLANT
FACESHIELD WRAPAROUND (MASK) ×2 IMPLANT
FACESHIELD WRAPAROUND OR TEAM (MASK) ×1 IMPLANT
GLOVE SRG 8 PF TXTR STRL LF DI (GLOVE) ×1 IMPLANT
GLOVE SURG ENC MOIS LTX SZ7.5 (GLOVE) ×2 IMPLANT
GLOVE SURG POLYISO LF SZ7.5 (GLOVE) ×2 IMPLANT
GLOVE SURG UNDER POLY LF SZ7.5 (GLOVE) ×2 IMPLANT
GLOVE SURG UNDER POLY LF SZ8 (GLOVE) ×2
GOWN STRL REUS W/TWL LRG LVL3 (GOWN DISPOSABLE) ×4 IMPLANT
GOWN STRL REUS W/TWL XL LVL3 (GOWN DISPOSABLE) ×2 IMPLANT
HEAD MODULAR ENDO (Orthopedic Implant) ×2 IMPLANT
HEAD UNPLR 47XMDLR STRL HIP (Orthopedic Implant) IMPLANT
IMMOBILIZER KNEE 22 UNIV (SOFTGOODS) ×2 IMPLANT
KIT BASIN OR (CUSTOM PROCEDURE TRAY) ×2 IMPLANT
KIT TURNOVER KIT A (KITS) IMPLANT
MANIFOLD NEPTUNE II (INSTRUMENTS) ×2 IMPLANT
NEEDLE HYPO 22GX1.5 SAFETY (NEEDLE) ×1 IMPLANT
NS IRRIG 1000ML POUR BTL (IV SOLUTION) ×2 IMPLANT
PACK TOTAL JOINT (CUSTOM PROCEDURE TRAY) ×2 IMPLANT
PAD ARMBOARD 7.5X6 YLW CONV (MISCELLANEOUS) ×4 IMPLANT
RETRIEVER SUT HEWSON (MISCELLANEOUS) ×2 IMPLANT
SLEEVE UNITRAX (Orthopedic Implant) ×1 IMPLANT
STAPLER VISISTAT 35W (STAPLE) IMPLANT
STEM HIP 4 127DEG (Stem) ×1 IMPLANT
SUT FIBERWIRE #2 38 REV NDL BL (SUTURE) ×4
SUT MNCRL AB 4-0 PS2 18 (SUTURE) ×2 IMPLANT
SUT MON AB 2-0 CT1 36 (SUTURE) ×2 IMPLANT
SUT STRATAFIX 0 PDS 27 VIOLET (SUTURE) ×4
SUT VIC AB 0 CT1 27 (SUTURE) ×2
SUT VIC AB 0 CT1 27XBRD ANTBC (SUTURE) IMPLANT
SUT VIC AB 1 CT1 27 (SUTURE) ×2
SUT VIC AB 1 CT1 27XBRD ANBCTR (SUTURE) ×1 IMPLANT
SUT VIC AB 2-0 CT2 27 (SUTURE) ×1 IMPLANT
SUTURE FIBERWR#2 38 REV NDL BL (SUTURE) ×2 IMPLANT
SUTURE STRATFX 0 PDS 27 VIOLET (SUTURE) IMPLANT
SYR 30ML LL (SYRINGE) ×1 IMPLANT
TOWEL OR 17X26 10 PK STRL BLUE (TOWEL DISPOSABLE) ×4 IMPLANT
TOWEL OR NON WOVEN STRL DISP B (DISPOSABLE) ×2 IMPLANT
TRAY FOLEY MTR SLVR 14FR STAT (SET/KITS/TRAYS/PACK) ×1 IMPLANT
TRAY FOLEY MTR SLVR 16FR STAT (SET/KITS/TRAYS/PACK) IMPLANT

## 2021-03-10 NOTE — Anesthesia Procedure Notes (Signed)
Procedure Name: MAC Date/Time: 03/10/2021 10:20 AM Performed by: Claudia Desanctis, CRNA Pre-anesthesia Checklist: Patient identified, Emergency Drugs available, Suction available and Patient being monitored Patient Re-evaluated:Patient Re-evaluated prior to induction Oxygen Delivery Method: Simple face mask

## 2021-03-10 NOTE — Discharge Instructions (Signed)

## 2021-03-10 NOTE — Progress Notes (Signed)
Pt's older son has care of her wedding ring.  Pt's dentures at bedside.  Pt brushed her teeth around 8am.  Tele monitor removed and CMT notified as pt is to OR at this time. Family at bedside at time patient is transferred.

## 2021-03-10 NOTE — Interval H&P Note (Signed)
History and Physical Interval Note:  03/10/2021 9:55 AM  Lori George  has presented today for surgery, with the diagnosis of LEFT HIP FRACTURE.  The various methods of treatment have been discussed with the patient and family. After consideration of risks, benefits and other options for treatment, the patient has consented to  Procedure(s): ARTHROPLASTY BIPOLAR HIP (HEMIARTHROPLASTY) (Left) as a surgical intervention.  The patient's history has been reviewed, patient examined, no change in status, stable for surgery.  I have reviewed the patient's chart and labs.  Questions were answered to the patient's satisfaction.     Sheral Apley

## 2021-03-10 NOTE — Progress Notes (Signed)
Initial Nutrition Assessment  DOCUMENTATION CODES:      INTERVENTION:  When pt is allowed clear liquids provide:  Boost Breeze po TID, each supplement provides 250 kcal and 9 grams of protein   Multivitamin daily  NUTRITION DIAGNOSIS:   Increased nutrient needs related to post-op healing as evidenced by estimated needs.   GOAL:  Patient will meet greater than or equal to 90% of their needs   MONITOR:  Diet advancement, PO intake, Skin, Labs, Weight trends  REASON FOR ASSESSMENT:   Consult Hip fracture protocol  ASSESSMENT: RD working remotely.   Patient is a 88 yo female with left hip fracture who fell at home. Transferred from [facility]/9 and presents for surgery -hemiarthroplasty left hip today.   PMH: thyroid disease, stroke, glaucoma. Patient is hard of hearing.   Patient not available at this time to discuss oral intake. She is independent with ADL's per chart review.   Review of weight history unremarkable. Current wt 49.9 kg (110 lb). Under desirable weight for age. At risk for malnutrition.   Medications reviewed and include: synthroid, protonix, tramadol.    Labs: BMP Latest Ref Rng & Units 03/09/2021 03/08/2021 10/27/2018  Glucose 70 - 99 mg/dL 671(I) 458(K) -  BUN 8 - 23 mg/dL 99(I) 33(A) -  Creatinine 0.44 - 1.00 mg/dL 2.50 5.39 7.67  Sodium 135 - 145 mmol/L 140 140 -  Potassium 3.5 - 5.1 mmol/L 4.4 4.2 -  Chloride 98 - 111 mmol/L 110 106 -  CO2 22 - 32 mmol/L 24 26 -  Calcium 8.9 - 10.3 mg/dL 7.9(L) 8.8(L) -     NUTRITION - FOCUSED PHYSICAL EXAM: Unable to complete Nutrition-Focused physical exam at this time.  RD working remotely.    Diet Order:   Diet Order             Diet NPO time specified  Diet effective midnight                   EDUCATION NEEDS:  Not appropriate for education at this time  Skin:  Skin Assessment: Reviewed RN Assessment (left hip incision)  Last BM:  unknown  Height:   Ht Readings from Last 1 Encounters:   03/08/21 5\' 4"  (1.626 m)    Weight:   Wt Readings from Last 1 Encounters:  03/10/21 49.9 kg    Ideal Body Weight:   55 kg  BMI:  Body mass index is 18.88 kg/m.  Estimated Nutritional Needs:   Kcal:  1700-1850  Protein:  75-85 gr  Fluid:  >1200 ml daily   05/08/21 MS,RD,CSG,LDN Contact: Royann Shivers

## 2021-03-10 NOTE — Anesthesia Postprocedure Evaluation (Signed)
Anesthesia Post Note  Patient: Lori George  Procedure(s) Performed: ARTHROPLASTY BIPOLAR HIP (HEMIARTHROPLASTY) (Left: Hip)     Patient location during evaluation: PACU Anesthesia Type: Spinal Level of consciousness: oriented and awake and alert Pain management: pain level controlled Vital Signs Assessment: post-procedure vital signs reviewed and stable Respiratory status: spontaneous breathing, respiratory function stable and patient connected to nasal cannula oxygen Cardiovascular status: blood pressure returned to baseline and stable Postop Assessment: no headache, no backache, no apparent nausea or vomiting, spinal receding and patient able to bend at knees Anesthetic complications: no   No notable events documented.  Last Vitals:  Vitals:   03/10/21 1230 03/10/21 1245  BP: 119/69 113/70  Pulse: 74 74  Resp: 16 15  Temp:    SpO2: 98% 97%    Last Pain:  Vitals:   03/10/21 1245  TempSrc:   PainSc: Asleep                 Valente Fosberg,W. EDMOND

## 2021-03-10 NOTE — Anesthesia Preprocedure Evaluation (Addendum)
Anesthesia Evaluation  Patient identified by MRN, date of birth, ID band Patient awake    Reviewed: Allergy & Precautions, H&P , NPO status , Patient's Chart, lab work & pertinent test results  Airway Mallampati: I  TM Distance: >3 FB Neck ROM: Full    Dental no notable dental hx. (+) Edentulous Upper, Edentulous Lower, Dental Advisory Given   Pulmonary neg pulmonary ROS,    Pulmonary exam normal breath sounds clear to auscultation       Cardiovascular negative cardio ROS   Rhythm:Regular Rate:Normal     Neuro/Psych TIACVA negative psych ROS   GI/Hepatic Neg liver ROS, GERD  Medicated,  Endo/Other  Hypothyroidism   Renal/GU negative Renal ROS  negative genitourinary   Musculoskeletal   Abdominal   Peds  Hematology negative hematology ROS (+)   Anesthesia Other Findings   Reproductive/Obstetrics negative OB ROS                            Anesthesia Physical Anesthesia Plan  ASA: 2  Anesthesia Plan: Spinal   Post-op Pain Management:    Induction: Intravenous  PONV Risk Score and Plan: 3 and Propofol infusion, Ondansetron and Treatment may vary due to age or medical condition  Airway Management Planned: Natural Airway and Simple Face Mask  Additional Equipment:   Intra-op Plan:   Post-operative Plan:   Informed Consent: I have reviewed the patients History and Physical, chart, labs and discussed the procedure including the risks, benefits and alternatives for the proposed anesthesia with the patient or authorized representative who has indicated his/her understanding and acceptance.     Dental advisory given  Plan Discussed with: CRNA  Anesthesia Plan Comments:         Anesthesia Quick Evaluation

## 2021-03-10 NOTE — Progress Notes (Signed)
PROGRESS NOTE    Lori DykesDelane P George  ZHY:[id number] DOB: 24-Feb-1933 DOA: 03/08/2021 PCP: Kari BaarsHawkins, Edward, MD    Brief Narrative:  Lori George is a 88 year old female with past medical history significant for hypothyroidism, GERD who initially presented to [facility] ED on 1/8 following fall at home.  Patient's son at bedside who assists with history given her severe pain and hard of hearing.  Patient currently lives alone and is independent of ADLs.  After family left following lunch, patient had apparent fall in the kitchen with associated severe left hip pain.  EMS was called and transported to the ED for further evaluation, in route received 8 mg of IV morphine.  In the ED, temperature 98.3 F, HR 80, RR 16, BP 136/76.  SPO2 96% on 2 L nasal cannula.  Pelvic x-ray notable for a left femoral neck fracture.  CT head without contrast with no acute intracranial abnormality.  Patient received another 2 mg IV morphine, 0.5 mg IV Dilaudid in the ED.  EDP discussed with orthopedics, Dr. Eulah PontMurphy who [facility].  [facility], hospitalist service was consulted for further evaluation and management of left femoral neck fracture.   Assessment & Plan:   Principal Problem:   Displaced fracture of left femoral neck (HCC) Active Problems:   Hypothyroidism   Hyperlipidemia   Acute respiratory failure with hypoxia (HCC)   Closed fracture of neck of left femur (HCC)   Displaced left femoral neck fracture Patient presenting as a transfer from [facility] following fall in her kitchen and with subsequent displaced left femoral neck fracture noted on imaging.  No loss of consciousness and CT head without contrast unrevealing. --Orthopedics following, appreciate assistance; plan left hip arthroplasty by Dr. Eulah PontMurphy today. --Pain control and postoperative DVT prophylaxis per orthopedics. --PT/OT evaluation  tomorrow, anticipate likely need of SNF placement.  Acute respiratory failure with hypoxia Community-acquired versus aspiration pneumonia Etiology likely medication side effect with aggressive treatment of pain with IV narcotics versus community-acquired versus aspiration pneumonia.  WBC count 12.3, afebrile.  Chest x-ray with airspace disease left lower base consistent with atelectasis versus pneumonia versus aspiration. --Azithromycin 5 mg IV every 24 hours --Unasyn 1.5 g IV every 6 hours --Continue supplemental oxygen, maintain SPO2 greater than 92% --Incentive spirometry/flutter valve --Will attempt to wean postoperatively  Hypothyroidism --Levothyroxine 75 mcg p.o. daily  GERD: Protonix 40 mg p.o. daily   DVT prophylaxis: Postoperative per orthopedics   Code Status: Full Code Family Communication: Son present at bedside updated this morning  Disposition Plan:  Level of care: Med-Surg Status is: Inpatient  Remains inpatient appropriate because: Plan operative management for hip fracture today, will likely need SNF placement dependent on PT/OT evaluation.     Consultants:  Orthopedics, Dr. Eulah PontMurphy  Procedures:  Left hip arthroplasty: Pending  Antimicrobials:  Azithromycin 1/8>> Unasyn 1/8>> Perioperative cefazolin 1/10   Subjective: Patient seen examined at bedside, resting comfortably.  No specific complaints this morning.  Hard of hearing.  Son present at bedside.  Pain currently controlled while laying still.  Awaiting operative management later this morning.  No other specific questions or concerns at this time.  Denies headache, no chest pain, no shortness of breath, no abdominal pain.  No acute events overnight per nursing staff.  Objective: Vitals:   03/10/21 0600 03/10/21 0618 03/10/21 0842 03/10/21 0845  BP:    123/83  Pulse:    79  Resp:  17 18  18   Temp:    97.9 F (36.6 C)  TempSrc:    Oral  SpO2:    95%  Weight:   49.9 kg   Height:         Intake/Output Summary (Last 24 hours) at 03/10/2021 1205 Last data filed at 03/10/2021 1153 Gross per 24 hour  Intake 1842.44 ml  Output 400 ml  Net 1442.44 ml   Filed Weights   03/08/21 1906 03/10/21 0842  Weight: 49.9 kg 49.9 kg    Examination:  General exam: Appears calm and comfortable elderly in appearance Respiratory system: Clear to auscultation. Respiratory effort normal.  On 2 L nasal cannula with SPO2 95% at rest Cardiovascular system: S1 & S2 heard, RRR. No JVD, murmurs, rubs, gallops or clicks. No pedal edema. Gastrointestinal system: Abdomen is nondistended, soft and nontender. No organomegaly or masses felt. Normal bowel sounds heard. Central nervous system: Alert and oriented. No focal neurological deficits. Extremities: Left lower extremity shortened, externally rotated, no peripheral edema Skin: No rashes, lesions or ulcers Psychiatry: Judgement and insight appear normal. Mood & affect appropriate.     Data Reviewed: I have personally reviewed following labs and imaging studies  CBC: Recent Labs  Lab 03/08/21 1928 03/09/21 0314  WBC 11.2* 12.3*  NEUTROABS 9.6*  --   HGB 13.4 12.2  HCT 41.3 37.9  MCV 97.6 99.2  PLT 223 195   Basic Metabolic Panel: Recent Labs  Lab 03/08/21 1928 03/09/21 0314  NA 140 140  K 4.2 4.4  CL 106 110  CO2 26 24  GLUCOSE 134* 189*  BUN 30* 28*  CREATININE 0.87 0.82  CALCIUM 8.8* 7.9*   GFR: Estimated Creatinine Clearance: 38.8 mL/min (by C-G formula based on SCr of 0.82 mg/dL). Liver Function Tests: Recent Labs  Lab 03/08/21 1928  AST 22  ALT 21  ALKPHOS 48  BILITOT 0.6  PROT 6.4*  ALBUMIN 4.0   No results for input(s): LIPASE, AMYLASE in the last 168 hours. No results for input(s): AMMONIA in the last 168 hours. Coagulation Profile: No results for input(s): INR, PROTIME in the last 168 hours. Cardiac Enzymes: No results for input(s): CKTOTAL, CKMB, CKMBINDEX, TROPONINI in the last 168 hours. BNP  (last 3 results) No results for input(s): PROBNP in the last 8760 hours. HbA1C: No results for input(s): HGBA1C in the last 72 hours. CBG: No results for input(s): GLUCAP in the last 168 hours. Lipid Profile: No results for input(s): CHOL, HDL, LDLCALC, TRIG, CHOLHDL, LDLDIRECT in the last 72 hours. Thyroid Function Tests: No results for input(s): TSH, T4TOTAL, FREET4, T3FREE, THYROIDAB in the last 72 hours. Anemia Panel: No results for input(s): VITAMINB12, FOLATE, FERRITIN, TIBC, IRON, RETICCTPCT in the last 72 hours. Sepsis Labs: Recent Labs  Lab 03/08/21 1928  PROCALCITON <0.10    Recent Results (from the past 240 hour(s))  Resp Panel by RT-PCR (Flu A&B, Covid) Nasopharyngeal Swab     Status: None   Collection Time: 03/08/21  7:13 PM   Specimen: Nasopharyngeal Swab; Nasopharyngeal(NP) swabs in vial transport medium  Result Value Ref Range Status   SARS Coronavirus 2 by RT PCR NEGATIVE NEGATIVE Final    Comment: (NOTE) SARS-CoV-2 target nucleic acids are NOT DETECTED.  The SARS-CoV-2 RNA is generally detectable in upper respiratory specimens during the acute phase of infection. The lowest concentration of SARS-CoV-2 viral copies this assay can detect is 138 copies/mL. A negative result does not preclude SARS-Cov-2 infection and should not be used as  the sole basis for treatment or other patient management decisions. A negative result may occur with  improper specimen collection/handling, submission of specimen other than nasopharyngeal swab, presence of viral mutation(s) within the areas targeted by this assay, and inadequate number of viral copies(<138 copies/mL). A negative result must be combined with clinical observations, patient history, and epidemiological information. The expected result is Negative.  Fact Sheet for Patients:  BloggerCourse.com  Fact Sheet for Healthcare Providers:  SeriousBroker.it  This test  is no t yet approved or cleared by the [place] FDA and  has been authorized for detection and/or diagnosis of SARS-CoV-2 by FDA under an Emergency Use Authorization (EUA). This EUA will remain  in effect (meaning this test can be used) for the duration of the COVID-19 declaration under Section 564(b)(1) of the Act, 21 U.S.C.section 360bbb-3(b)(1), unless the authorization is terminated  or revoked sooner.       Influenza A by PCR NEGATIVE NEGATIVE Final   Influenza B by PCR NEGATIVE NEGATIVE Final    Comment: (NOTE) The Xpert Xpress SARS-CoV-2/FLU/RSV plus assay is intended as an aid in the diagnosis of influenza from Nasopharyngeal swab specimens and should not be used as a sole basis for treatment. Nasal washings and aspirates are unacceptable for Xpert Xpress SARS-CoV-2/FLU/RSV testing.  Fact Sheet for Patients: BloggerCourse.com  Fact Sheet for Healthcare Providers: SeriousBroker.it  This test is not yet approved or cleared by the [place] FDA and has been authorized for detection and/or diagnosis of SARS-CoV-2 by FDA under an Emergency Use Authorization (EUA). This EUA will remain in effect (meaning this test can be used) for the duration of the COVID-19 declaration under Section 564(b)(1) of the Act, 21 U.S.C. section 360bbb-3(b)(1), unless the authorization is terminated or revoked.  Performed at [facility], [address], [place], [place] [address]   Surgical pcr screen     Status: None   Collection Time: 03/09/21  8:58 PM   Specimen: Nasal Mucosa; Nasal Swab  Result Value Ref Range Status   MRSA, PCR NEGATIVE NEGATIVE Final   Staphylococcus aureus NEGATIVE NEGATIVE Final    Comment: (NOTE) The Xpert SA Assay (FDA approved for NASAL specimens in patients 65 years of age and older), is one component of a comprehensive surveillance program. It is not intended to diagnose infection nor to guide or monitor  treatment. Performed at [facility], [address], [place], [place] [address]          Radiology Studies: CT Head Wo Contrast  Result Date: 03/08/2021 CLINICAL DATA:  Fall. Head trauma, intracranial arterial injury suspected EXAM: CT HEAD WITHOUT CONTRAST TECHNIQUE: Contiguous axial images were obtained from the base of the skull through the vertex without intravenous contrast. COMPARISON:  None. FINDINGS: Brain: No acute intracranial abnormality. Specifically, no hemorrhage, hydrocephalus, mass lesion, acute infarction, or significant intracranial injury. Vascular: No hyperdense vessel or unexpected calcification. Skull: No acute calvarial abnormality. Sinuses/Orbits: No acute findings Other: None IMPRESSION: No acute intracranial abnormality. Electronically Signed   By: Charlett Nose M.D.   On: 03/08/2021 20:34   DG CHEST PORT 1 VIEW  Result Date: 03/08/2021 CLINICAL DATA:  Preop hip fracture EXAM: PORTABLE CHEST 1 VIEW COMPARISON:  CT 05/05/2017, chest x-ray 05/05/2017, 07/17/2014 FINDINGS: Mild cardiomegaly. Mild diffuse coarse and slightly reticular interstitial opacity suggesting chronic disease. Patchy airspace opacity at the left base. Stable cardiomediastinal silhouette with aortic atherosclerosis. No visible pneumothorax. Multiple treated compression deformities of the spine. IMPRESSION: Airspace disease at the left  lung base may reflect atelectasis, pneumonia or aspiration. Electronically Signed   By: Jasmine Pang M.D.   On: 03/08/2021 21:49   DG Hip Unilat W or Wo Pelvis 2-3 Views Left  Result Date: 03/08/2021 CLINICAL DATA:  Fall with pain. EXAM: DG HIP (WITH OR WITHOUT PELVIS) 2-3V LEFT COMPARISON:  None. FINDINGS: Displaced left femoral neck fracture. Fracture is mildly comminuted with proximal migration of the femoral shaft. Femoral head is well seated in the acetabulum. The bones are under mineralized. Pubic rami are grossly intact. Vertebroplasty within the  lower lumbar spine. IMPRESSION: Displaced left femoral neck fracture. Electronically Signed   By: Narda Rutherford M.D.   On: 03/08/2021 19:48        Scheduled Meds:  [MAR Hold] acetaminophen  650 mg Oral Q6H   [MAR Hold] aspirin EC  81 mg Oral Daily   [MAR Hold] levothyroxine  75 mcg Oral QAC breakfast   multivitamin with minerals  1 tablet Oral Daily   [MAR Hold] pantoprazole  40 mg Oral Daily   [MAR Hold] traMADol  50 mg Oral Q6H   Continuous Infusions:  [MAR Hold] ampicillin-sulbactam (UNASYN) IV 1.5 g (03/10/21 0552)   [MAR Hold] azithromycin 500 mg (03/09/21 2128)   [MAR Hold] promethazine (PHENERGAN) injection (IM or IVPB) Stopped (03/09/21 0355)     LOS: 2 days    Time spent: 39 minutes spent on chart review, discussion with nursing staff, consultants, updating family and interview/physical exam; more than 50% of that time was spent in counseling and/or coordination of care.    Alvira Philips [place], DO [facility] Available via Epic secure chat 7am-7pm After these hours, please refer to coverage provider listed on [email or web address] 03/10/2021, 12:05 PM

## 2021-03-10 NOTE — Transfer of Care (Signed)
Immediate Anesthesia Transfer of Care Note  Patient: Lori George  Procedure(s) Performed: ARTHROPLASTY BIPOLAR HIP (HEMIARTHROPLASTY) (Left: Hip)  Patient Location: PACU  Anesthesia Type:Spinal  Level of Consciousness: drowsy  Airway & Oxygen Therapy: Patient Spontanous Breathing and Patient connected to face mask  Post-op Assessment: Report given to RN and Post -op Vital signs reviewed and stable  Post vital signs: Reviewed and stable  Last Vitals:  Vitals Value Taken Time  BP 96/57 03/10/21 1157  Temp    Pulse 69 03/10/21 1200  Resp 13 03/10/21 1200  SpO2 97 % 03/10/21 1200  Vitals shown include unvalidated device data.  Last Pain:  Vitals:   03/10/21 0845  TempSrc: Oral  PainSc:          Complications: No notable events documented.

## 2021-03-10 NOTE — Evaluation (Signed)
SLP Cancellation Note  Patient Details Name: Lori George MRN: [id number] DOB: [birth year]   Cancelled treatment:       Reason Eval/Treat Not Completed: Other (comment) (pt just had surgery, will follow up next date in am for evaluation)  Rolena Infante, MS [facility] SLP Acute Rehab Services Office [phone] Cell [phone]  Chales Abrahams 03/10/2021, 2:13 PM

## 2021-03-10 NOTE — Op Note (Signed)
03/10/2021  2:07 PM  PATIENT:  Lori George   MRN: [id number]  PRE-OPERATIVE DIAGNOSIS:  LEFT HIP FRACTURE  POST-OPERATIVE DIAGNOSIS:  LEFT HIP FRACTURE  PROCEDURE:  Procedure(s): ARTHROPLASTY BIPOLAR HIP (HEMIARTHROPLASTY)  PREOPERATIVE INDICATIONS:  Lori George is an 88 y.o. female who was admitted 03/08/2021 with a diagnosis of Displaced fracture of left femoral neck ([place]) and elected for surgical management.  The risks benefits and alternatives were discussed with the patient including but not limited to the risks of nonoperative treatment, versus surgical intervention including infection, bleeding, nerve injury, periprosthetic fracture, the need for revision surgery, dislocation, leg length discrepancy, blood clots, cardiopulmonary complications, morbidity, mortality, among others, and they were willing to proceed.  Predicted outcome is good, although there will be at least a six to nine month expected recovery.   OPERATIVE REPORT     SURGEON:  Edmonia Lynch, MD    ASSISTANT:  Aggie Moats, PA-C, he was present and scrubbed throughout the case, critical for completion in a timely fashion, and for retraction, instrumentation, and closure.     ANESTHESIA:  General    COMPLICATIONS:  None.      COMPONENTS:  [employer] Acolade: Femoral stem: 4, Femoral Head:match anatomic, Neck:-4   PROCEDURE IN DETAIL: The patient was met in the holding area and identified.  The appropriate hip  was marked at the operative site. The patient was then transported to the OR and  placed under general anesthesia.  At that point, the patient was  placed in the lateral decubitus position with the operative side up and  secured to the operating room table and all bony prominences padded.     The operative lower extremity was prepped from the iliac crest to the toes.  Sterile draping was performed.  Time out was performed prior to incision.      A routine posterolateral approach was utilized via sharp  dissection  carried down to the subcutaneous tissue.  Gross bleeders were Bovie  coagulated.  The iliotibial band was identified and incised  along the length of the skin incision.  Self-retaining retractors were  inserted. I examined the bursa there was significant hematoma and edema I performed a bursectomy here.  With the hip internally rotated, the short external rotators  were identified. The piriformis was tagged with FiberWire, and the hip capsule released in a T-type fashion.  The femoral neck was exposed, and I resected the femoral neck using the appropriate jig. This was performed at approximately a thumb's breadth above the lesser trochanter.    I then exposed the deep acetabulum, cleared out any tissue including the ligamentum teres, and included the hip capsule in the FiberWire used above and below the T.    I then prepared the proximal femur using the cookie-cutter, the lateralizing reamer, and then sequentially broached.  A trial utilized, and I reduced the hip and it was found to have excellent stability with functional range of motion. The trial components were then removed.   The canal and acetabulum were thoroughly irrigated  I inserted the pressfit stem and placed the head and neck collar. The hip was reduced with appropriate force and was stable through a range of motion.   I then used a 2 mm drill bits to pass the FiberWire suture from the capsule and puriform is through the greater trochanter, and secured this. Excellent posterior capsular repair was achieved. I also closed the T in the capsule.  I then irrigated the hip copiously again  with pulse lavage, and repaired the fascia with Vicryl, followed by Vicryl for the subcutaneous tissue, Monocryl for the skin, Steri-Strips and sterile gauze. The wounds were injected. The patient was then awakened and returned to PACU in stable and satisfactory condition. There were no complications.  POST-OP PLAN: Weight bearing as  tolerated. DVT px will consist of SCD's and chemical px  Edmonia Lynch, MD Orthopedic Surgeon [phone]   03/10/2021 2:07 PM

## 2021-03-10 NOTE — Anesthesia Procedure Notes (Signed)
Spinal  Patient location during procedure: OR Start time: 03/10/2021 10:05 AM End time: 03/10/2021 10:10 AM Reason for block: surgical anesthesia Staffing Performed: anesthesiologist  Anesthesiologist: Gaynelle Adu, MD Preanesthetic Checklist Completed: patient identified, IV checked, risks and benefits discussed, surgical consent, monitors and equipment checked, pre-op evaluation and timeout performed Spinal Block Patient position: right lateral decubitus Prep: DuraPrep Patient monitoring: cardiac monitor, continuous pulse ox and blood pressure Approach: right paramedian Location: L3-4 Injection technique: single-shot Needle Needle type: Quincke  Needle gauge: 22 G Needle length: 9 cm Assessment Sensory level: T8 Events: CSF return Additional Notes Functioning IV was confirmed and monitors were applied. Sterile prep and drape, including hand hygiene and sterile gloves were used. The patient was positioned and the spine was prepped. The skin was anesthetized with lidocaine.  Free flow of clear CSF was obtained prior to injecting local anesthetic into the CSF.  The spinal needle aspirated freely following injection.  The needle was carefully withdrawn.  The patient tolerated the procedure well.

## 2021-03-11 ENCOUNTER — Encounter (HOSPITAL_COMMUNITY): Payer: Self-pay | Admitting: Orthopedic Surgery

## 2021-03-11 LAB — BASIC METABOLIC PANEL
Anion gap: 4 — ABNORMAL LOW (ref 5–15)
BUN: 25 mg/dL — ABNORMAL HIGH (ref 8–23)
CO2: 24 mmol/L (ref 22–32)
Calcium: 7.6 mg/dL — ABNORMAL LOW (ref 8.9–10.3)
Chloride: 109 mmol/L (ref 98–111)
Creatinine, Ser: 0.75 mg/dL (ref 0.44–1.00)
GFR, Estimated: >60 mL/min (ref >60)
Glucose, Bld: 122 mg/dL — ABNORMAL HIGH (ref 70–99)
Potassium: 4.5 mmol/L (ref 3.5–5.1)
Sodium: 137 mmol/L (ref 135–145)

## 2021-03-11 LAB — CBC
HCT: 32.9 % — ABNORMAL LOW (ref 36.0–46.0)
Hemoglobin: 10.5 g/dL — ABNORMAL LOW (ref 12.0–15.0)
MCH: 32.1 pg (ref 26.0–34.0)
MCHC: 31.9 g/dL (ref 30.0–36.0)
MCV: 100.6 fL — ABNORMAL HIGH (ref 80.0–100.0)
Platelets: 133 10*3/uL — ABNORMAL LOW (ref 150–400)
RBC: 3.27 MIL/uL — ABNORMAL LOW (ref 3.87–5.11)
RDW: 12.7 % (ref 11.5–15.5)
WBC: 9.8 10*3/uL (ref 4.0–10.5)
nRBC: 0 % (ref 0.0–0.2)

## 2021-03-11 MED ORDER — AMOXICILLIN-POT CLAVULANATE 875-125 MG PO TABS
1.0000 | ORAL_TABLET | Freq: Two times a day (BID) | ORAL | Status: DC
  Administered 2021-03-11 – 2021-03-13 (×4): 1 via ORAL
  Filled 2021-03-11 (×4): qty 1

## 2021-03-11 MED ORDER — AZITHROMYCIN 250 MG PO TABS
500.0000 mg | ORAL_TABLET | Freq: Every day | ORAL | Status: AC
  Administered 2021-03-11 – 2021-03-12 (×2): 500 mg via ORAL
  Filled 2021-03-11 (×2): qty 2

## 2021-03-11 MED ORDER — SUCRALFATE 1 GM/10ML PO SUSP
1.0000 g | Freq: Two times a day (BID) | ORAL | Status: DC
  Administered 2021-03-11 – 2021-03-13 (×5): 1 g via ORAL
  Filled 2021-03-11 (×5): qty 10

## 2021-03-11 NOTE — Progress Notes (Signed)
PHARMACIST - PHYSICIAN COMMUNICATION DR:   Posey Pronto CONCERNING: Antibiotic IV to Oral Route Change Policy  RECOMMENDATION: This patient is receiving Azithromycin by the intravenous route.  Based on criteria approved by the Pharmacy and Therapeutics Committee, the antibiotic(s) is/are being converted to the equivalent oral dose form(s).   DESCRIPTION: These criteria include: Patient being treated for a respiratory tract infection, urinary tract infection, cellulitis or clostridium difficile associated diarrhea if on metronidazole The patient is not neutropenic and does not exhibit a GI malabsorption state The patient is eating (either orally or via tube) and/or has been taking other orally administered medications for a least 24 hours The patient is improving clinically and has a Tmax < 100.5  Minda Ditto PharmD 03/11/2021, 10:06 AM  If you have questions about this conversion, please contact the Pharmacy Department  []   [phone] )  [facility] []   [phone] )  [facility] []   [phone] )  [facility] []   [phone] )  [facility] [x]   [phone] )  [facility]

## 2021-03-11 NOTE — Evaluation (Signed)
Clinical/Bedside Swallow Evaluation Patient Details  Name: Lori George MRN: [id number] Date of Birth: 09-Jun-1932  Today's Date: 03/11/2021 Time: SLP Start Time (ACUTE ONLY): 1005 SLP Stop Time (ACUTE ONLY): 1045 SLP Time Calculation (min) (ACUTE ONLY): 40 min  Past Medical History:  Past Medical History:  Diagnosis Date   Glaucoma    Stroke [facility])    Thyroid disease    Past Surgical History:  Past Surgical History:  Procedure Laterality Date   BACK SURGERY     HIP ARTHROPLASTY Left 03/10/2021   Procedure: ARTHROPLASTY BIPOLAR HIP (HEMIARTHROPLASTY);  Surgeon: Sheral Apley, MD;  Location: [facility];  Service: Orthopedics;  Laterality: Left;   IR FLUORO GUIDED NEEDLE PLC ASPIRATION/INJECTION LOC  06/17/2017   IR KYPHO EA ADDL LEVEL THORACIC OR LUMBAR  06/17/2017   IR KYPHO THORACIC WITH BONE BIOPSY  06/17/2017   IR RADIOLOGIST EVAL & MGMT  06/13/2017   HPI:  is a 88 yo female with left hip fracture who fell at home. Transferred from [facility] 1/9 and presents for surgery -hemiarthroplasty left hip today.      PMH: thyroid disease, stroke, glaucoma. Patient is hard of hearing.  Swallow evaluation ordered.    Assessment / Plan / Recommendation  Clinical Impression  Patient presents with suspected esophageal component of dysphagia - c/b report of GERD and food "not going down" - pointing to esophagus.   Patient admits she ran out her reflux medication prior to admission.  Cranial nerve exam unremarkable.   Patient observed consuming cheddar cheese, applesauce, and thin water.   She failed 3 ounce Yale water challenge evidenced by immediate cough post-swallow.  Small single boluses tolerated without coughing.  Pt reports h/o some dysphagia - coughing with intake, refluxing, sensing food lodging (pointing to distal pharynx) which she states normally clears with liquids.  Liquid was not effective to clear food retention sensation, however applesauce effectively alleviated symptoms. Suspect patient's  symptoms may be due to known h/o reflux - She also reports pain - substernal - which is not associated with intake - but worsens with coughing.    Reviewed general aspiration and esophageal precautions using teach back.  Pt, MD and family agreeable to monitor pt's tolerance of po diet with re-start of PPI.  Will follow up x1 to assure tolerance and that instrumental evaluation is not indicated. SLP Visit Diagnosis: Dysphagia, oropharyngeal phase (R13.12)    Aspiration Risk  Mild aspiration risk    Diet Recommendation Dysphagia 3 (Mech soft);Thin liquid   Liquid Administration via: Cup;Straw;Spoon Medication Administration: Whole meds with liquid Supervision: Patient able to self feed Compensations: Slow rate;Small sips/bites Postural Changes: Remain upright for at least 30 minutes after po intake;Seated upright at 90 degrees    Other  Recommendations Oral Care Recommendations: Oral care BID    Recommendations for follow up therapy are one component of a multi-disciplinary discharge planning process, led by the attending physician.  Recommendations may be updated based on patient status, additional functional criteria and insurance authorization.  Follow up Recommendations No SLP follow up      Assistance Recommended at Discharge Frequent or constant Supervision/Assistance  Functional Status Assessment Patient has had a recent decline in their functional status and demonstrates the ability to make significant improvements in function in a reasonable and predictable amount of time.  Frequency and Duration min 1 x/week  1 week       Prognosis Prognosis for Safe Diet Advancement: Fair      Swallow Study  General Date of Onset: 03/11/21 HPI: is a 88 yo female with left hip fracture who fell at home. Transferred from [facility] 1/9 and presents for surgery -hemiarthroplasty left hip today.      PMH: thyroid disease, stroke, glaucoma. Patient is hard of hearing.  Swallow evaluation  ordered. Type of Study: Bedside Swallow Evaluation Diet Prior to this Study: Regular;Thin liquids Temperature Spikes Noted: No Respiratory Status: Nasal cannula (3 liters) History of Recent Intubation: No (spinal block for procedure) Behavior/Cognition: Alert;Cooperative;Pleasant mood Oral Cavity Assessment: Within Functional Limits Oral Care Completed by SLP: No Oral Cavity - Dentition: Dentures, top;Dentures, bottom Vision: Functional for self-feeding Self-Feeding Abilities: Able to feed self Patient Positioning: Upright in bed Baseline Vocal Quality: Low vocal intensity Volitional Cough: Weak Volitional Swallow: Able to elicit    Oral/Motor/Sensory Function     Ice Chips Ice chips: Not tested   Thin Liquid Thin Liquid: Impaired Presentation: Self Fed;Straw;Cup Pharyngeal  Phase Impairments: Cough - Immediate Other Comments: pt failed 3 ounce Yale swallow screen due to immediate cough post-swallow, small single sips were tolerated without cough response    Nectar Thick Nectar Thick Liquid: Not tested   Honey Thick Honey Thick Liquid: Not tested   Puree Puree: Within functional limits Presentation: Self Fed;Spoon   Solid     Solid: Impaired Presentation: Self Fed Other Comments: Pt advised "That's going to be hard to swallow"      Chales Abrahams 03/11/2021,11:11 AM  Rolena Infante, MS [facility] SLP Acute Rehab Services Office [phone] Cell [phone]

## 2021-03-11 NOTE — Plan of Care (Signed)
Plan of care reviewed and discussed with the patient and her family. 

## 2021-03-11 NOTE — Evaluation (Signed)
Physical Therapy Evaluation Patient Details Name: Lori George MRN: [id number] DOB: 1932-04-14 Today's Date: 03/11/2021  History of Present Illness  Pt is an 88 year old female admitted after fall at home and sustained Displaced left femoral neck fracture.  Pt also found to haveAcute respiratory failure with hypoxia: community-acquired versus aspiration pneumonia.  Pt s/p left hip hemiarthroplasty posterior approach on 03/10/21. PMHx significant for glaucoma, stroke, back surgery  Clinical Impression  Patient is s/p above surgery resulting in functional limitations due to the deficits listed below (see PT Problem List). Patient will benefit from skilled PT to increase their independence and safety with mobility to allow discharge to the venue listed below.  Pt and daughter educated on posterior hip precautions and purpose of KI.  Pt assisted OOB and ambulated short distance in hallway however by fatigue, nausea, and dizziness.  SPO2 83% on room air upon returning to room so 2L O2 [place] reapplied.  Pt from home alone and typically independent with mobility and ADLs.  Pt would benefit from d/c to SNF at this time.      Recommendations for follow up therapy are one component of a multi-disciplinary discharge planning process, led by the attending physician.  Recommendations may be updated based on patient status, additional functional criteria and insurance authorization.  Follow Up Recommendations Skilled nursing-short term rehab (<3 hours/day)    Assistance Recommended at Discharge Frequent or constant Supervision/Assistance  Patient can return home with the following  A lot of help with walking and/or transfers;A lot of help with bathing/dressing/bathroom;Assistance with cooking/housework    Equipment Recommendations Rolling walker (2 wheels);BSC/3in1  Recommendations for Other Services       Functional Status Assessment Patient has had a recent decline in their functional status and  demonstrates the ability to make significant improvements in function in a reasonable and predictable amount of time.     Precautions / Restrictions Precautions Precautions: Fall;Posterior Hip Precaution Comments: very HOH; monitor sats, provided posterior hip precautions handout and reviewed with pt and daughter Required Braces or Orthoses: Knee Immobilizer - Left (to assist with maintaining precautions) Restrictions Weight Bearing Restrictions: No Other Position/Activity Restrictions: WBAT      Mobility  Bed Mobility Overal bed mobility: Needs Assistance Bed Mobility: Supine to Sit     Supine to sit: Min assist;HOB elevated     General bed mobility comments: cues for precautions, assist to scoot to EOB    Transfers Overall transfer level: Needs assistance Equipment used: Rolling walker (2 wheels) Transfers: Sit to/from Stand Sit to Stand: Min assist           General transfer comment: multimodal cues for technique and maintaining posterior hip precautions; assist to rise and steady    Ambulation/Gait Ambulation/Gait assistance: Min assist Gait Distance (Feet): 40 Feet Assistive device: Rolling walker (2 wheels) Gait Pattern/deviations: Step-to pattern;Antalgic;Decreased stance time - left       General Gait Details: multimodal cues for precautions, sequence, RW positioning; pt reports mild dizziness during ambulation and then nausea upon sitting in recliner; pt with dry heaves in recliner and SPO2 83% on room air upon returning to room so reapplied 2L O2 [place] (RN notified)  Stairs            Wheelchair Mobility    Modified Rankin (Stroke Patients Only)       Balance Overall balance assessment: History of Falls  Pertinent Vitals/Pain Pain Assessment: Faces Faces Pain Scale: Hurts little more Pain Location: left hip with movement Pain Descriptors / Indicators: Sore;Grimacing;Guarding Pain  Intervention(s): Monitored during session;Repositioned    Home Living Family/patient expects to be discharged to:: Private residence Living Arrangements: Alone Available Help at Discharge: Family (son lives next door) Type of Home: House Home Access: Level entry       Home Layout: One level Home Equipment: None      Prior Function Prior Level of Function : Independent/Modified Independent                     Hand Dominance        Extremity/Trunk Assessment        Lower Extremity Assessment Lower Extremity Assessment: Generalized weakness;LLE deficits/detail LLE Deficits / Details: maintained precautions, L LE required assist for bed mobility       Communication   Communication: HOH  Cognition Arousal/Alertness: Awake/alert Behavior During Therapy: WFL for tasks assessed/performed Overall Cognitive Status: Within Functional Limits for tasks assessed                                 General Comments: appropriate during session, very HOH Functional Status Assessment: Patient has had a recent decline in their functional status and demonstrates the ability to make significant improvements in function in a reasonable and predictable amount of time.      General Comments      Exercises     Assessment/Plan    PT Assessment Patient needs continued PT services  PT Problem List Decreased strength;Decreased activity tolerance;Decreased mobility;Decreased knowledge of precautions;Decreased knowledge of use of DME;Pain;Decreased balance       PT Treatment Interventions Therapeutic activities;DME instruction;Gait training;Balance training;Therapeutic exercise;Functional mobility training;Patient/family education;Stair training    PT Goals (Current goals can be found in the Care Plan section)  Acute Rehab PT Goals PT Goal Formulation: With patient/family Time For Goal Achievement: 03/25/21 Potential to Achieve Goals: Good    Frequency Min  4X/week     Co-evaluation               AM-PAC PT "6 Clicks" Mobility  Outcome Measure Help needed turning from your back to your side while in a flat bed without using bedrails?: A Little Help needed moving from lying on your back to sitting on the side of a flat bed without using bedrails?: A Lot Help needed moving to and from a bed to a chair (including a wheelchair)?: A Lot Help needed standing up from a chair using your arms (e.g., wheelchair or bedside chair)?: A Lot Help needed to walk in hospital room?: A Lot Help needed climbing 3-5 steps with a railing? : Total 6 Click Score: 12    End of Session Equipment Utilized During Treatment: Gait belt;Left knee immobilizer Activity Tolerance: Patient tolerated treatment well Patient left: in chair;with call bell/phone within reach;with chair alarm set;with family/visitor present Nurse Communication: Mobility status PT Visit Diagnosis: Other abnormalities of gait and mobility (R26.89)    Time: 9892-1194 PT Time Calculation (min) (ACUTE ONLY): 25 min   Charges:   PT Evaluation $PT Eval Low Complexity: 1 Low PT Treatments $Gait Training: 8-22 mins      Thomasene Mohair PT, DPT Acute Rehabilitation Services Pager: [phone] Office: [phone]   Janan Halter [place] 03/11/2021, 12:33 PM

## 2021-03-11 NOTE — NC FL2 (Signed)
[place] MEDICAID FL2 LEVEL OF CARE SCREENING TOOL     IDENTIFICATION  Patient Name: Lori George Birthdate: 1932/07/05 Sex: female Admission Date (Current Location): 03/08/2021  [facility] and [place] Number:  Producer, television/film/video and Address:  [facility],  [address] [place], [place] [address]      Provider Number: [id number]  Attending Physician Name and Address:  Rolly Salter, MD  Relative Name and Phone Number:  Ziggy Reveles (son) Ph: [phone]    Current Level of Care: Hospital Recommended Level of Care: Skilled Nursing Facility Prior Approval Number:    Date Approved/Denied:   PASRR Number: [id number]  Discharge Plan: SNF    Current Diagnoses: Patient Active Problem List   Diagnosis Date Noted   Closed fracture of neck of left femur (HCC) 03/09/2021   Displaced fracture of left femoral neck (HCC) 03/08/2021   Acute respiratory failure with hypoxia (HCC) 03/08/2021   Hyperlipidemia 05/17/2016   GERD (gastroesophageal reflux disease) 05/17/2016   TIA (transient ischemic attack) 05/15/2016   Low back pain 05/15/2016   Hypothyroidism 05/15/2016    Orientation RESPIRATION BLADDER Height & Weight     Self, Situation, Place  O2 (3L/min) Incontinent Weight: 110 lb (49.9 kg) Height:  5\' 4"  (162.6 cm)  BEHAVIORAL SYMPTOMS/MOOD NEUROLOGICAL BOWEL NUTRITION STATUS   (N/A)   Continent Diet (Dysphagia 3 diet)  AMBULATORY STATUS COMMUNICATION OF NEEDS Skin   Limited Assist Verbally Surgical wounds                       Personal Care Assistance Level of Assistance  Bathing, Feeding, Dressing Bathing Assistance: Limited assistance Feeding assistance: Independent Dressing Assistance: Limited assistance     Functional Limitations Info  Sight, Hearing, Speech Sight Info: Adequate Hearing Info: Impaired Speech Info: Adequate    SPECIAL CARE FACTORS FREQUENCY  PT (By licensed PT), OT (By licensed OT)     PT Frequency: 5x's/week OT  Frequency: 5x's/week            Contractures Contractures Info: Not present    Additional Factors Info  Code Status, Allergies Code Status Info: Full Allergies Info: NKA           Current Medications (03/11/2021):  This is the current hospital active medication list Current Facility-Administered Medications  Medication Dose Route Frequency Provider Last Rate Last Admin   0.9 %  sodium chloride infusion   Intravenous PRN 05/09/2021, Eric J, DO 10 mL/hr at 03/10/21 1412 New Bag at 03/10/21 1412   acetaminophen (TYLENOL) tablet 325-650 mg  325-650 mg Oral Q6H PRN 05/08/21 M, PA-C       alum & mag hydroxide-simeth (MAALOX/MYLANTA) 200-200-20 MG/5ML suspension 30 mL  30 mL Oral Q4H PRN Gawne, Meghan M, PA-C       amoxicillin-clavulanate (AUGMENTIN) 875-125 MG per tablet 1 tablet  1 tablet Oral Q12H M, MD       aspirin EC tablet 81 mg  81 mg Oral Daily Rolly Salter M, PA-C   81 mg at 03/11/21 1157   azithromycin (ZITHROMAX) tablet 500 mg  500 mg Oral Daily 05/09/21, RPH       bisacodyl (DULCOLAX) suppository 10 mg  10 mg Rectal Daily PRN Gawne, Meghan M, PA-C       Chlorhexidine Gluconate Cloth 2 % PADS 6 each  6 each Topical Daily M, Eric J, DO       docusate sodium (COLACE) capsule 100 mg  100  mg Oral BID Levester Fresh M, PA-C   100 mg at 03/11/21 1157   enoxaparin (LOVENOX) injection 30 mg  30 mg Subcutaneous Q24H Gawne, Meghan M, PA-C   30 mg at 03/11/21 1150   HYDROcodone-acetaminophen (NORCO) 7.5-325 MG per tablet 1-2 tablet  1-2 tablet Oral Q4H PRN Gawne, Meghan M, PA-C       HYDROcodone-acetaminophen (NORCO/VICODIN) 5-325 MG per tablet 1-2 tablet  1-2 tablet Oral Q4H PRN Gawne, Meghan M, PA-C       levothyroxine (SYNTHROID) tablet 75 mcg  75 mcg Oral QAC breakfast Levester Fresh M, PA-C   75 mcg at 03/11/21 9937   menthol-cetylpyridinium (CEPACOL) lozenge 3 mg  1 lozenge Oral PRN Levester Fresh M, PA-C       Or   phenol (CHLORASEPTIC) mouth spray 1  spray  1 spray Mouth/Throat PRN Gawne, Lindie Spruce M, PA-C       methocarbamol (ROBAXIN) tablet 500 mg  500 mg Oral Q6H PRN Gawne, Meghan M, PA-C       Or   methocarbamol (ROBAXIN) 500 mg in dextrose 5 % 50 mL IVPB  500 mg Intravenous Q6H PRN Gawne, Meghan M, PA-C       metoCLOPramide (REGLAN) tablet 5-10 mg  5-10 mg Oral Q8H PRN Gawne, Meghan M, PA-C       Or   metoCLOPramide (REGLAN) injection 5-10 mg  5-10 mg Intravenous Q8H PRN Gawne, Meghan M, PA-C       morphine 2 MG/ML injection 0.5-1 mg  0.5-1 mg Intravenous Q2H PRN Gawne, Meghan M, PA-C       multivitamin with minerals tablet 1 tablet  1 tablet Oral Daily Levester Fresh M, PA-C   1 tablet at 03/11/21 1157   naproxen (NAPROSYN) tablet 250 mg  250 mg Oral BID WC Levester Fresh M, PA-C   250 mg at 03/11/21 1156   ondansetron (ZOFRAN) tablet 4 mg  4 mg Oral Q6H PRN Gawne, Meghan M, PA-C       Or   ondansetron [facility]) injection 4 mg  4 mg Intravenous Q6H PRN Levester Fresh M, PA-C   4 mg at 03/11/21 1150   pantoprazole (PROTONIX) EC tablet 40 mg  40 mg Oral Daily Levester Fresh M, PA-C   40 mg at 03/11/21 1157   polyethylene glycol (MIRALAX / GLYCOLAX) packet 17 g  17 g Oral Daily PRN Gawne, Meghan M, PA-C       promethazine (PHENERGAN) 12.5 mg in sodium chloride 0.9 % 50 mL IVPB  12.5 mg Intravenous Q6H PRN Jenne Pane, PA-C   Stopped at 03/09/21 0355   sucralfate (CARAFATE) 1 GM/10ML suspension 1 g  1 g Oral BID WC Rolly Salter, MD       traMADol Janean Sark) tablet 50 mg  50 mg Oral Q6H Levester Fresh M, PA-C   50 mg at 03/11/21 1696     Discharge Medications: Please see discharge summary for a list of discharge medications.  Relevant Imaging Results:  Relevant Lab Results:   Additional Information SSN: [id number]  Ewing Schlein, LCSW

## 2021-03-11 NOTE — Progress Notes (Signed)
PROGRESS NOTE    Lori George  DVV:[id number] DOB: 05-02-1932 DOA: 03/08/2021 PCP: Kari Baars, MD    Brief Narrative:  Lori George is a 88 year old female with past medical history significant for hypothyroidism, GERD who initially presented to [facility], ED on 1/8 following fall at home.  Patient's son at bedside who assists with history given her severe pain and hard of hearing.  Patient currently lives alone and is independent of ADLs.  After family left following lunch, patient had apparent fall in the kitchen with associated severe left hip pain.  EMS was called and transported to the ED for further evaluation, in route received 8 mg of IV morphine.  In the ED, temperature 98.3 F, HR 80, RR 16, BP 136/76.  SPO2 96% on 2 L nasal cannula.  Pelvic x-ray notable for a left femoral neck fracture.  CT head without contrast with no acute intracranial abnormality.  Patient received another 2 mg IV morphine, 0.5 mg IV Dilaudid in the ED.  EDP discussed with orthopedics, Dr. Eulah Pont who recommended transfer to [facility] for surgery planned on 03/10/2021.  Patient was transferred to [facility], hospitalist service was consulted for further evaluation and management of left femoral neck fracture.   Assessment & Plan:   Principal Problem:   Displaced fracture of left femoral neck (HCC) Active Problems:   Hypothyroidism   Hyperlipidemia   Acute respiratory failure with hypoxia (HCC)   Closed fracture of neck of left femur (HCC)   Displaced left femoral neck fracture Patient presenting as a transfer from [facility] following fall in her kitchen and with subsequent displaced left femoral neck fracture noted on imaging.  No loss of consciousness and CT head without contrast unrevealing. --Orthopedics following, appreciate assistance; underwent left hip arthroplasty by Dr. Eulah Pont . --Pain control and postoperative DVT prophylaxis per orthopedics. --PT/OT evaluation  tomorrow, anticipate likely need of SNF placement.  Postop acute blood loss anemia. Expected. Will monitor H&H.  Acute respiratory failure with hypoxia Community-acquired versus aspiration pneumonia Etiology likely medication side effect with aggressive treatment of pain with IV narcotics versus community-acquired versus aspiration pneumonia.  WBC count 12.3, afebrile.  Chest x-ray with airspace disease left lower base consistent with atelectasis versus pneumonia versus aspiration. Currently on azithromycin and IV Unasyn.  Switch to p.o. Suspect will require oxygen on discharge. Speech therapy consulted, patient reports epigastric pain but no dysphagia.  Will allow dysphagia 3 diet.  Add Carafate.  Continue PPI.  Hypothyroidism --Levothyroxine 75 mcg p.o. daily  GERD: Protonix 40 mg p.o. daily Continue Carafate. Patient ran out of her medication few months ago.  DVT prophylaxis: Postoperative per orthopedics   Code Status: Full Code Family Communication: Family at bedside.  Disposition Plan:  Level of care: Med-Surg Status is: Inpatient  Remains inpatient appropriate because: Requires monitoring for CBC and tolerance of oral diet.    Consultants:  Orthopedics, Dr. Eulah Pont  Procedures:  Left hip arthroplasty: Pending  Antimicrobials:  Azithromycin 1/8>> Unasyn 1/8>> Perioperative cefazolin 1/10   Subjective: No acute complaint no nausea or vomiting.  Mentation improving and remaining at baseline.  Reports epigastric pain while trying to swallow.  Objective: Vitals:   03/11/21 0503 03/11/21 1054 03/11/21 1430 03/11/21 2026  BP: 119/74 128/68 114/68 (!) 142/73  Pulse: 74 76 74 80  Resp: 16 16 16 17   Temp: 98.2 F (36.8 C) 97.6 F (36.4 C) (!) 97.5 F (36.4 C) 98.9 F (37.2 C)  TempSrc: Oral  Oral  Oral  SpO2: 96% 97% 96% 96%  Weight:      Height:        Intake/Output Summary (Last 24 hours) at 03/11/2021 2211 Last data filed at 03/11/2021 2029 Gross per 24  hour  Intake 1240 ml  Output 1000 ml  Net 240 ml    Filed Weights   03/08/21 1906 03/10/21 0842  Weight: 49.9 kg 49.9 kg    Examination:  Vitals:   03/11/21 0503 03/11/21 1054 03/11/21 1430 03/11/21 2026  BP: 119/74 128/68 114/68 (!) 142/73  Pulse: 74 76 74 80  Resp: 16 16 16 17   Temp: 98.2 F (36.8 C) 97.6 F (36.4 C) (!) 97.5 F (36.4 C) 98.9 F (37.2 C)  TempSrc: Oral  Oral Oral  SpO2: 96% 97% 96% 96%  Weight:      Height:        General: Appear in mild distress, no Rash; Oral Mucosa Clear, moist. no Abnormal Neck Mass Or lumps, Conjunctiva normal  Cardiovascular: S1 and S2 Present, no Murmur, Respiratory: good respiratory effort, Bilateral Air entry present and CTA, no Crackles, no wheezes Abdomen: Bowel Sound present, Soft and no tenderness Extremities: no Pedal edema Neurology: alert and oriented to time, place, and person affect appropriate. no new focal deficit Gait not checked due to patient safety concerns    Data Reviewed: I have personally reviewed following labs and imaging studies  CBC: Recent Labs  Lab 03/08/21 1928 03/09/21 0314 03/11/21 0312  WBC 11.2* 12.3* 9.8  NEUTROABS 9.6*  --   --   HGB 13.4 12.2 10.5*  HCT 41.3 37.9 32.9*  MCV 97.6 99.2 100.6*  PLT 223 195 133*    Basic Metabolic Panel: Recent Labs  Lab 03/08/21 1928 03/09/21 0314 03/11/21 0312  NA 140 140 137  K 4.2 4.4 4.5  CL 106 110 109  CO2 26 24 24   GLUCOSE 134* 189* 122*  BUN 30* 28* 25*  CREATININE 0.87 0.82 0.75  CALCIUM 8.8* 7.9* 7.6*    GFR: Estimated Creatinine Clearance: 39.8 mL/min (by C-G formula based on SCr of 0.75 mg/dL). Liver Function Tests: Recent Labs  Lab 03/08/21 1928  AST 22  ALT 21  ALKPHOS 48  BILITOT 0.6  PROT 6.4*  ALBUMIN 4.0    No results for input(s): LIPASE, AMYLASE in the last 168 hours. No results for input(s): AMMONIA in the last 168 hours. Coagulation Profile: No results for input(s): INR, PROTIME in the last 168  hours. Cardiac Enzymes: No results for input(s): CKTOTAL, CKMB, CKMBINDEX, TROPONINI in the last 168 hours. BNP (last 3 results) No results for input(s): PROBNP in the last 8760 hours. HbA1C: No results for input(s): HGBA1C in the last 72 hours. CBG: No results for input(s): GLUCAP in the last 168 hours. Lipid Profile: No results for input(s): CHOL, HDL, LDLCALC, TRIG, CHOLHDL, LDLDIRECT in the last 72 hours. Thyroid Function Tests: No results for input(s): TSH, T4TOTAL, FREET4, T3FREE, THYROIDAB in the last 72 hours. Anemia Panel: No results for input(s): VITAMINB12, FOLATE, FERRITIN, TIBC, IRON, RETICCTPCT in the last 72 hours. Sepsis Labs: Recent Labs  Lab 03/08/21 1928  PROCALCITON <0.10     Recent Results (from the past 240 hour(s))  Resp Panel by RT-PCR (Flu A&B, Covid) Nasopharyngeal Swab     Status: None   Collection Time: 03/08/21  7:13 PM   Specimen: Nasopharyngeal Swab; Nasopharyngeal(NP) swabs in vial transport medium  Result Value Ref Range Status   SARS Coronavirus 2 by RT PCR NEGATIVE  NEGATIVE Final    Comment: (NOTE) SARS-CoV-2 target nucleic acids are NOT DETECTED.  The SARS-CoV-2 RNA is generally detectable in upper respiratory specimens during the acute phase of infection. The lowest concentration of SARS-CoV-2 viral copies this assay can detect is 138 copies/mL. A negative result does not preclude SARS-Cov-2 infection and should not be used as the sole basis for treatment or other patient management decisions. A negative result may occur with  improper specimen collection/handling, submission of specimen other than nasopharyngeal swab, presence of viral mutation(s) within the areas targeted by this assay, and inadequate number of viral copies(<138 copies/mL). A negative result must be combined with clinical observations, patient history, and epidemiological information. The expected result is Negative.  Fact Sheet for Patients:   BloggerCourse.comhttps://www.fda.gov/media/152166/download  Fact Sheet for Healthcare Providers:  SeriousBroker.ithttps://www.fda.gov/media/152162/download  This test is no t yet approved or cleared by the [place] FDA and  has been authorized for detection and/or diagnosis of SARS-CoV-2 by FDA under an Emergency Use Authorization (EUA). This EUA will remain  in effect (meaning this test can be used) for the duration of the COVID-19 declaration under Section 564(b)(1) of the Act, 21 U.S.C.section 360bbb-3(b)(1), unless the authorization is terminated  or revoked sooner.       Influenza A by PCR NEGATIVE NEGATIVE Final   Influenza B by PCR NEGATIVE NEGATIVE Final    Comment: (NOTE) The Xpert Xpress SARS-CoV-2/FLU/RSV plus assay is intended as an aid in the diagnosis of influenza from Nasopharyngeal swab specimens and should not be used as a sole basis for treatment. Nasal washings and aspirates are unacceptable for Xpert Xpress SARS-CoV-2/FLU/RSV testing.  Fact Sheet for Patients: BloggerCourse.comhttps://www.fda.gov/media/152166/download  Fact Sheet for Healthcare Providers: SeriousBroker.ithttps://www.fda.gov/media/152162/download  This test is not yet approved or cleared by the [place] FDA and has been authorized for detection and/or diagnosis of SARS-CoV-2 by FDA under an Emergency Use Authorization (EUA). This EUA will remain in effect (meaning this test can be used) for the duration of the COVID-19 declaration under Section 564(b)(1) of the Act, 21 U.S.C. section 360bbb-3(b)(1), unless the authorization is terminated or revoked.  Performed at [facility], [address] [place], [place] [address]   Surgical pcr screen     Status: None   Collection Time: 03/09/21  8:58 PM   Specimen: Nasal Mucosa; Nasal Swab  Result Value Ref Range Status   MRSA, PCR NEGATIVE NEGATIVE Final   Staphylococcus aureus NEGATIVE NEGATIVE Final    Comment: (NOTE) The Xpert SA Assay (FDA approved for NASAL specimens in patients 22 years of  age and older), is one component of a comprehensive surveillance program. It is not intended to diagnose infection nor to guide or monitor treatment. Performed at [facility], [address] [place], [place] [address]           Radiology Studies: Pelvis Portable  Result Date: 03/10/2021 CLINICAL DATA:  Post LEFT hip arthroplasty EXAM: PORTABLE PELVIS 1-2 VIEWS COMPARISON:  Portable exam 1229 hours compared to 03/08/2021 FINDINGS: Interval resection of LEFT femoral head/neck and prior fracture with placement of a LEFT hip prosthesis. Diffuse osseous demineralization. No additional fracture or dislocation. Visualized pelvis intact. RIGHT hip joint space preserved. IMPRESSION: LEFT hip arthroplasty without acute osseous findings. Electronically Signed   By: Ulyses SouthwardMark  Boles M.D.   On: 03/10/2021 12:54        Scheduled Meds:  amoxicillin-clavulanate  1 tablet Oral Q12H   aspirin EC  81 mg Oral Daily   azithromycin  500  mg Oral Daily   Chlorhexidine Gluconate Cloth  6 each Topical Daily   docusate sodium  100 mg Oral BID   enoxaparin (LOVENOX) injection  30 mg Subcutaneous Q24H   levothyroxine  75 mcg Oral QAC breakfast   multivitamin with minerals  1 tablet Oral Daily   naproxen  250 mg Oral BID WC   pantoprazole  40 mg Oral Daily   sucralfate  1 g Oral BID WC   traMADol  50 mg Oral Q6H   Continuous Infusions:  sodium chloride 10 mL/hr at 03/10/21 1412   methocarbamol (ROBAXIN) IV     promethazine (PHENERGAN) injection (IM or IVPB) Stopped (03/09/21 0355)     LOS: 3 days    Time spent: 39 minutes spent on chart review, discussion with nursing staff, consultants, updating family and interview/physical exam; more than 50% of that time was spent in counseling and/or coordination of care.    Lynden Oxford,  [facility] Available via Epic secure chat 7am-7pm After these hours, please refer to coverage provider listed on [email or web address] 03/11/2021, 10:11 PM

## 2021-03-11 NOTE — TOC Initial Note (Signed)
Transition of Care [facility]) - Initial/Assessment Note   Patient Details  Name: Lori George MRN: [id number] Date of Birth: February 07, 1933  Transition of Care [facility] CM/SW Contact:    Lori Schlein, LCSW Phone Number: 03/11/2021, 2:00 PM  Clinical Narrative: PT evaluation recommended SNF. CSW spoke with son, Lori George, regarding recommendations. Family is agreeable to SNF. Patient is unvaccinated for COVID.  FL2 done; PASRR received. Initial referral faxed out to facilities in [place] and [place] counties. TOC awaiting bed offers.  Expected Discharge Plan: Skilled Nursing Facility Barriers to Discharge: Continued Medical Work up  Patient Goals and CMS Choice Patient states their goals for this hospitalization and ongoing recovery are:: Go to rehab before going home CMS Medicare.gov Compare Post Acute Care list provided to:: Patient Represenative (must comment) Choice offered to / list presented to : Adult Children  Expected Discharge Plan and Services Expected Discharge Plan: Skilled Nursing Facility In-house Referral: Clinical Social Work Living arrangements for the past 2 months: Single Family Home            DME Arranged: N/A DME Agency: NA  Prior Living Arrangements/Services Living arrangements for the past 2 months: Single Family Home Patient language and need for interpreter reviewed:: Yes Do you feel safe going back to the place where you live?: Yes      Need for Family Participation in Patient Care: Yes (Comment) Care giver support system in place?: Yes (comment) Criminal Activity/Legal Involvement Pertinent to Current Situation/Hospitalization: No - Comment as needed  Activities of Daily Living Home Assistive Devices/Equipment: None ADL Screening (condition at time of admission) Patient's cognitive ability adequate to safely complete daily activities?: Yes Is the patient deaf or have difficulty hearing?: Yes Does the patient have difficulty seeing, even when wearing  glasses/contacts?: No Does the patient have difficulty concentrating, remembering, or making decisions?: No Patient able to express need for assistance with ADLs?: Yes Does the patient have difficulty dressing or bathing?: Yes Independently performs ADLs?: No Does the patient have difficulty walking or climbing stairs?: Yes Weakness of Legs: Left Weakness of Arms/Hands: None  Permission Sought/Granted Permission sought to share information with : Facility Industrial/product designer granted to share information with : Yes, Verbal Permission Granted Permission granted to share info w AGENCY: SNFs  Emotional Assessment Appearance:: Appears stated age Attitude/Demeanor/Rapport: Lethargic Affect (typically observed): Appropriate Orientation: : Oriented to Self, Oriented to Place, Oriented to Situation Alcohol / Substance Use: Not Applicable Psych Involvement: No (comment)  Admission diagnosis:  Fall [W19.XXXA] Closed fracture of neck of left femur (HCC) [S72.002A] Displaced fracture of left femoral neck (HCC) [S72.002A] Closed left hip fracture, initial encounter (HCC) [S72.002A] Patient Active Problem List   Diagnosis Date Noted   Closed fracture of neck of left femur (HCC) 03/09/2021   Displaced fracture of left femoral neck (HCC) 03/08/2021   Acute respiratory failure with hypoxia (HCC) 03/08/2021   Hyperlipidemia 05/17/2016   GERD (gastroesophageal reflux disease) 05/17/2016   TIA (transient ischemic attack) 05/15/2016   Low back pain 05/15/2016   Hypothyroidism 05/15/2016   PCP:  Kari Baars, MD Pharmacy:   [facility] [place] [place] - [address] [place] [address] Phone: [phone] Fax: [phone]  Readmission Risk Interventions No flowsheet data found.

## 2021-03-11 NOTE — Progress Notes (Signed)
° ° °  Subjective: Patient reports pain as mild. Controlled with medicine. Not eating much. Drinking water. Urinating via foley. No CP, SOB. Hasn't seen PT yet to work on mobilization OOB.   Objective:   VITALS:   Vitals:   03/10/21 1549 03/10/21 2048 03/11/21 0149 03/11/21 0503  BP: 119/70 121/62 111/76 119/74  Pulse: 85 84 77 74  Resp: 18 16 16 16   Temp: 98.1 F (36.7 C) 98.8 F (37.1 C) 97.6 F (36.4 C) 98.2 F (36.8 C)  TempSrc: Oral Oral Oral Oral  SpO2: 94% 96% 98% 96%  Weight:      Height:       CBC Latest Ref Rng & Units 03/11/2021 03/09/2021 03/08/2021  WBC 4.0 - 10.5 K/uL 9.8 12.3(H) 11.2(H)  Hemoglobin 12.0 - 15.0 g/dL 10.5(L) 12.2 13.4  Hematocrit 36.0 - 46.0 % 32.9(L) 37.9 41.3  Platelets 150 - 400 K/uL 133(L) 195 223   BMP Latest Ref Rng & Units 03/11/2021 03/09/2021 03/08/2021  Glucose 70 - 99 mg/dL 122(H) 189(H) 134(H)  BUN 8 - 23 mg/dL 25(H) 28(H) 30(H)  Creatinine 0.44 - 1.00 mg/dL 0.75 0.82 0.87  Sodium 135 - 145 mmol/L 137 140 140  Potassium 3.5 - 5.1 mmol/L 4.5 4.4 4.2  Chloride 98 - 111 mmol/L 109 110 106  CO2 22 - 32 mmol/L 24 24 26   Calcium 8.9 - 10.3 mg/dL 7.6(L) 7.9(L) 8.8(L)   Intake/Output      01/10 0701 01/11 0700 01/11 0701 01/12 0700   P.O. 60    I.V. (mL/kg) 717.9 (14.4)    IV Piggyback 975.9    Total Intake(mL/kg) 1753.9 (35.1)    Urine (mL/kg/hr) 1275 (1.1)    Blood 100    Total Output 1375    Net +378.9            Physical Exam: General: NAD.  Laying in bed, calm, comfortable. Very HOH Resp: No increased wob Cardio: regular rate and rhythm ABD soft Neurologically intact MSK Neurovascularly intact Sensation intact distally Intact pulses distally Dorsiflexion/Plantar flexion intact Incision: dressing C/D/I KI in place  Assessment: 1 Day Post-Op  S/P Procedure(s) (LRB): ARTHROPLASTY BIPOLAR HIP (HEMIARTHROPLASTY) (Left) by Dr. Ernesta Amble. Percell Miller on 03/10/21  Principal Problem:   Displaced fracture of left femoral neck  (HCC) Active Problems:   Hypothyroidism   Hyperlipidemia   Acute respiratory failure with hypoxia (HCC)   Closed fracture of neck of left femur (HCC)   Plan:  Advance diet Up with therapy Incentive Spirometry Elevate and Apply ice  Weightbearing: WBAT LLE, posterior hip precautions Insicional and dressing care: Dressings left intact until follow-up and Reinforce dressings as needed Orthopedic device(s):  KI to help with posterior hip precautions Showering: Keep dressing dry VTE prophylaxis:  Lovenox 30mg  daily   while inpatient, can switch to ASA 81mg  bid x 30 days upon d/c , SCDs, ambulation Pain control: Tylenol, Tramadol, Naproxen, Norco, Morphine Follow - up plan: 2 weeks Contact information:  Edmonia Lynch MD, Aggie Moats PA-C  Dispo:  TBD upon PT evals. Will likely need SNF since lives alone.    Britt Bottom, PA-C Office [phone] 03/11/2021, 9:04 AM

## 2021-03-12 DIAGNOSIS — J189 Pneumonia, unspecified organism: Secondary | ICD-10-CM | POA: Diagnosis present

## 2021-03-12 DIAGNOSIS — D696 Thrombocytopenia, unspecified: Secondary | ICD-10-CM | POA: Diagnosis present

## 2021-03-12 DIAGNOSIS — D62 Acute posthemorrhagic anemia: Secondary | ICD-10-CM | POA: Diagnosis present

## 2021-03-12 DIAGNOSIS — M8080XA Other osteoporosis with current pathological fracture, unspecified site, initial encounter for fracture: Secondary | ICD-10-CM | POA: Diagnosis present

## 2021-03-12 LAB — CBC
HCT: 31.3 % — ABNORMAL LOW (ref 36.0–46.0)
Hemoglobin: 10.3 g/dL — ABNORMAL LOW (ref 12.0–15.0)
MCH: 32.2 pg (ref 26.0–34.0)
MCHC: 32.9 g/dL (ref 30.0–36.0)
MCV: 97.8 fL (ref 80.0–100.0)
Platelets: 137 10*3/uL — ABNORMAL LOW (ref 150–400)
RBC: 3.2 MIL/uL — ABNORMAL LOW (ref 3.87–5.11)
RDW: 13.1 % (ref 11.5–15.5)
WBC: 6.5 10*3/uL (ref 4.0–10.5)
nRBC: 0 % (ref 0.0–0.2)

## 2021-03-12 LAB — BASIC METABOLIC PANEL
Anion gap: 5 (ref 5–15)
BUN: 21 mg/dL (ref 8–23)
CO2: 26 mmol/L (ref 22–32)
Calcium: 8 mg/dL — ABNORMAL LOW (ref 8.9–10.3)
Chloride: 107 mmol/L (ref 98–111)
Creatinine, Ser: 0.73 mg/dL (ref 0.44–1.00)
GFR, Estimated: >60 mL/min (ref >60)
Glucose, Bld: 111 mg/dL — ABNORMAL HIGH (ref 70–99)
Potassium: 3.9 mmol/L (ref 3.5–5.1)
Sodium: 138 mmol/L (ref 135–145)

## 2021-03-12 MED ORDER — ACETAMINOPHEN 500 MG PO TABS: 500.0000 mg | ORAL_TABLET | Freq: Four times a day (QID) | ORAL | 0 refills | Status: DC | PRN

## 2021-03-12 MED ORDER — TRAMADOL HCL 50 MG PO TABS
50.0000 mg | ORAL_TABLET | Freq: Four times a day (QID) | ORAL | 0 refills | Status: AC | PRN
Start: 2021-03-12 — End: 2022-03-12

## 2021-03-12 MED ORDER — ASPIRIN EC 81 MG PO TBEC: 81.0000 mg | DELAYED_RELEASE_TABLET | Freq: Two times a day (BID) | ORAL | 0 refills | Status: DC

## 2021-03-12 NOTE — Progress Notes (Signed)
Progress Note   Patient: Lori George [id number] DOB: [birth year] DOA: 03/08/2021     4 DOS: the patient was seen and examined on 03/12/2021   Brief hospital course: Lori George is a 88 year old female with past medical history significant for hypothyroidism, GERD who initially presented to [facility], ED on 1/8 following fall at home.  Patient's son at bedside who assists with history given her severe pain and hard of hearing.  Patient currently lives alone and is independent of ADLs.  After family left following lunch, patient had apparent fall in the kitchen with associated severe left hip pain.  EMS was called and transported to the ED for further evaluation. 1/8 presented to [facility] with a fall, found to have hip fracture. 1/9 transferred to [facility] long hospital. 1/10 underwent bipolar hip arthroplasty of left hip 1/12 now remains medically stable.  Will require oxygen at discharge.  Assessment and Plan * Closed displaced fracture of left femoral neck ([place])- (present on admission) Presented with a fall but Transferred from an event hospital. Found to have displaced left femoral neck fracture. 1/10 underwent left hip arthroplasty by Dr. Percell Miller . --Pain control and postoperative DVT prophylaxis per orthopedics. PT OT recommends SNF.  Acute postoperative anemia due to expected blood loss- (present on admission) Baseline hemoglobin 13.  Postoperatively around 10.  Stable.  Associated with thrombocytopenia which is also stable.  Likely dilutional in nature.  Monitor.  Acute respiratory failure with hypoxia ([place])- (present on admission) Secondary to community-acquired pneumonia as well as atelectasis. Does not use oxygen at baseline. Currently on 2 LPM to maintain adequate saturation. Continue incentive spirometry. Will continue antibiotics to treat the pneumonia as well. Suspect that the patient has a degree of chronic hypoxia  GERD (gastroesophageal reflux  disease)- (present on admission) Patient missed her medication and had increased reflux symptoms. Currently on PPI twice daily. Will add short course of Carafate for 14 days as well.  Osteoporosis with multiple compression vertebral fracture- (present on admission) Has chronic pain secondary to compression fractures. Monitor. PT OT consulted.  Hyperlipidemia- (present on admission) Has a history.  Not on medication.  Hypothyroidism- (present on admission) Continue Synthroid.     Subjective: No nausea no vomiting.  No fever no chills.  Pain well controlled.  No further acid reflux symptoms.  Objective Vitals:   03/12/21 1130 03/12/21 1255 03/12/21 1720 03/12/21 1725  BP:  127/70    Pulse:  80    Resp:  17    Temp:  98.3 F (36.8 C)    TempSrc:      SpO2: 97% 99% (!) 85% 96%  Weight:      Height:        General: Appear in mild distress, no Rash; Oral Mucosa Clear, moist. no Abnormal Neck Mass Or lumps, Conjunctiva normal  Cardiovascular: S1 and S2 Present, no Murmur, Respiratory: good respiratory effort, Bilateral Air entry present and CTA, no Crackles, no wheezes Abdomen: Bowel Sound present, Soft and no tenderness Extremities: no Pedal edema Neurology: alert and oriented to time, place, and person affect appropriate. no new focal deficit Gait not checked due to patient safety concerns     Data Reviewed:  My review of labs, imaging, notes and other tests shows no new significant findings.   Family Communication: Family at bedside.  Disposition: Status is: Inpatient  Remains inpatient appropriate because: Unsafe discharge.  Awaiting SNF placement.         Time spent: 35 minutes  Author: Berle Mull, MD 03/12/2021 9:55 PM  For on call review [email or web address].

## 2021-03-12 NOTE — Plan of Care (Signed)
Plan of care reviewed and discussed with the patient. 

## 2021-03-12 NOTE — Assessment & Plan Note (Signed)
Patient missed her medication and had increased reflux symptoms. Currently on PPI twice daily. Will add short course of Carafate for 14 days as well.

## 2021-03-12 NOTE — Assessment & Plan Note (Addendum)
Baseline hemoglobin 13.   Postoperatively around 10.  Stable.   Associated with thrombocytopenia which is also stable.  Likely dilutional in nature.  Monitor.

## 2021-03-12 NOTE — Assessment & Plan Note (Signed)
Has a history.  Not on medication.

## 2021-03-12 NOTE — Assessment & Plan Note (Addendum)
Has chronic pain secondary to compression fractures. Monitor. PT OT consulted. Recommended SNF

## 2021-03-12 NOTE — Hospital Course (Signed)
Lori George is a 88 year old female with past medical history significant for hypothyroidism, GERD who initially presented to [facility], ED on 1/8 following fall at home.  Patient's son at bedside who assists with history given her severe pain and hard of hearing.  Patient currently lives alone and is independent of ADLs.  After family left following lunch, patient had apparent fall in the kitchen with associated severe left hip pain.  EMS was called and transported to the ED for further evaluation. 1/8 presented to [facility] with a fall, found to have hip fracture. 1/9 transferred to [facility] long hospital. 1/10 underwent bipolar hip arthroplasty of left hip 1/12 now remains medically stable.  Will require oxygen at discharge.

## 2021-03-12 NOTE — Care Management Important Message (Signed)
Important Message  Patient Details IM Letter given to the Patient. Name: Lori George MRN: [id number] Date of Birth: November 10, 1932   Medicare Important Message Given:  Yes     Caren Macadam 03/12/2021, 12:57 PM

## 2021-03-12 NOTE — Progress Notes (Signed)
Physical Therapy Treatment Patient Details Name: Lori George MRN: [id number] DOB: [birth year] Today's Date: 03/12/2021   History of Present Illness Pt is an 88 year old female admitted after fall at home and sustained Displaced left femoral neck fracture.  Pt also found to haveAcute respiratory failure with hypoxia: community-acquired versus aspiration pneumonia.  Pt s/p left hip hemiarthroplasty posterior approach on 03/10/21. PMHx significant for glaucoma, stroke, back surgery    PT Comments    The patient  is progressing well. Patient expresses concern that her hemorrhoids are bleeding after noticing post BM. RN notified.  SPO2 on 2 L 985, ambulated x 20', SPO2 91(and while on toilet.) Ambulated x 50' Spo2 dropped to 87%. Replaced to 2 L, Back to 955. Continue PT for mobility.   Family assisting patient with IS.  Recommendations for follow up therapy are one component of a multi-disciplinary discharge planning process, led by the attending physician.  Recommendations may be updated based on patient status, additional functional criteria and insurance authorization.  Follow Up Recommendations  Skilled nursing-short term rehab (<3 hours/day)     Assistance Recommended at Discharge Frequent or constant Supervision/Assistance  Patient can return home with the following A lot of help with walking and/or transfers;A lot of help with bathing/dressing/bathroom;Assistance with cooking/housework   Equipment Recommendations  Rolling walker (2 wheels);BSC/3in1    Recommendations for Other Services       Precautions / Restrictions Precautions Precautions: Fall;Posterior Hip Precaution Comments: very HOH; monitor sats, provided posterior hip precautions handout and reviewed with pt and daughter Required Braces or Orthoses: Knee Immobilizer - Left Restrictions Other Position/Activity Restrictions: WBAT     Mobility  Bed Mobility   Bed Mobility: Sit to Supine       Sit to supine:  Min assist   General bed mobility comments: assist with left leg    Transfers Overall transfer level: Needs assistance Equipment used: Rolling walker (2 wheels) Transfers: Sit to/from Stand Sit to Stand: Min assist           General transfer comment: multimodal cues for technique and maintaining posterior hip precautions; assist to rise and steady, from recliner, BSc and chair.    Ambulation/Gait Ambulation/Gait assistance: Min assist Gait Distance (Feet): 20 Feet (10' then 50') Assistive device: Rolling walker (2 wheels) Gait Pattern/deviations: Step-to pattern;Antalgic;Decreased stance time - left Gait velocity: decr     General Gait Details: multimodal cues for precautions, sequence, RW positioning; On  RA   Stairs             Wheelchair Mobility    Modified Rankin (Stroke Patients Only)       Balance Overall balance assessment: History of Falls;Needs assistance Sitting-balance support: No upper extremity supported;Feet supported Sitting balance-Leahy Scale: Good     Standing balance support: During functional activity;Bilateral upper extremity supported;Reliant on assistive device for balance Standing balance-Leahy Scale: Fair                              Cognition Arousal/Alertness: Awake/alert Behavior During Therapy: WFL for tasks assessed/performed Overall Cognitive Status: Within Functional Limits for tasks assessed                                 General Comments: appropriate during session, very [facility]        Exercises General Exercises - Lower Extremity Long Arc Quad: AROM;Left;10 reps;Supine Heel  Slides: AAROM;Supine;Left;10 reps Hip ABduction/ADduction: AAROM;Supine;Left;10 reps    General Comments        Pertinent Vitals/Pain Faces Pain Scale: Hurts little more Pain Location: left hip with movement Pain Descriptors / Indicators: Sore;Grimacing;Guarding Pain Intervention(s): Monitored during  session;Repositioned    Home Living                          Prior Function            PT Goals (current goals can now be found in the care plan section) Progress towards PT goals: Progressing toward goals    Frequency    Min 3X/week      PT Plan Current plan remains appropriate;Frequency needs to be updated (blood on tissue after BM)    Co-evaluation              AM-PAC PT "6 Clicks" Mobility   Outcome Measure  Help needed turning from your back to your side while in a flat bed without using bedrails?: A Little Help needed moving from lying on your back to sitting on the side of a flat bed without using bedrails?: A Little Help needed moving to and from a bed to a chair (including a wheelchair)?: A Little Help needed standing up from a chair using your arms (e.g., wheelchair or bedside chair)?: A Lot Help needed to walk in hospital room?: A Lot Help needed climbing 3-5 steps with a railing? : Total 6 Click Score: 14    End of Session Equipment Utilized During Treatment: Gait belt;Left knee immobilizer Activity Tolerance: Patient tolerated treatment well Patient left: in bed;with call bell/phone within reach;with bed alarm set;with family/visitor present Nurse Communication: Mobility status (hemorrhoid blood post BM) PT Visit Diagnosis: Other abnormalities of gait and mobility (R26.89);History of falling (Z91.81)     Time: 1433-1500 PT Time Calculation (min) (ACUTE ONLY): 27 min  Charges:  $Gait Training: 8-22 mins $Self Care/Home Management: 8-22                     Blanchard Kelch PT Acute Rehabilitation Services Pager [phone] Office [phone]    Rada Hay 03/12/2021, 3:08 PM

## 2021-03-12 NOTE — TOC Progression Note (Signed)
Transition of Care [facility]) - Progression Note   Patient Details  Name: Lori George MRN: [id number] Date of Birth: 1933-02-12  [facility] CM/SW Contact  Ewing Schlein, LCSW Phone Number: 03/12/2021, 3:41 PM  Clinical Narrative: Family requested that patient also be faxed out to [place] facilities as these are closer to family. Family's first choices are [facility] in [place] and [facility] in [place]. CSW faxed clinicals to additional [place] facilities. Christina in admissions at [facility] is reviewing referral. CSW updated son, Dayrl. TOC awaiting update from [employer].  Expected Discharge Plan: Skilled Nursing Facility Barriers to Discharge: Continued Medical Work up  Expected Discharge Plan and Services Expected Discharge Plan: Skilled Nursing Facility In-house Referral: Clinical Social Work Living arrangements for the past 2 months: Single Family Home          DME Arranged: N/A DME Agency: NA  Readmission Risk Interventions No flowsheet data found.

## 2021-03-12 NOTE — Progress Notes (Signed)
Subjective: Patient reports pain as very mild. Controlled with medicine. Still not eating much. Complaining of epigastric pain, stomach bloating, and constipation with bloody hard stools. Says the stomach pain and fullness is making it hard to breathe. Drinking water. Urinating. Worked some with PT on mobilization OOB and did better today. Wants to go home but understands the need for a SNF.   Objective:   VITALS:   Vitals:   03/11/21 1054 03/11/21 1430 03/11/21 2026 03/12/21 0601  BP: 128/68 114/68 (!) 142/73 126/75  Pulse: 76 74 80 77  Resp: 16 16 17 16   Temp: 97.6 F (36.4 C) (!) 97.5 F (36.4 C) 98.9 F (37.2 C) (!) 97.4 F (36.3 C)  TempSrc:  Oral Oral Oral  SpO2: 97% 96% 96% 97%  Weight:      Height:       CBC Latest Ref Rng & Units 03/12/2021 03/11/2021 03/09/2021  WBC 4.0 - 10.5 K/uL 6.5 9.8 12.3(H)  Hemoglobin 12.0 - 15.0 g/dL 10.3(L) 10.5(L) 12.2  Hematocrit 36.0 - 46.0 % 31.3(L) 32.9(L) 37.9  Platelets 150 - 400 K/uL 137(L) 133(L) 195   BMP Latest Ref Rng & Units 03/12/2021 03/11/2021 03/09/2021  Glucose 70 - 99 mg/dL 111(H) 122(H) 189(H)  BUN 8 - 23 mg/dL 21 25(H) 28(H)  Creatinine 0.44 - 1.00 mg/dL 0.73 0.75 0.82  Sodium 135 - 145 mmol/L 138 137 140  Potassium 3.5 - 5.1 mmol/L 3.9 4.5 4.4  Chloride 98 - 111 mmol/L 107 109 110  CO2 22 - 32 mmol/L 26 24 24   Calcium 8.9 - 10.3 mg/dL 8.0(L) 7.6(L) 7.9(L)   Intake/Output      01/11 0701 01/12 0700 01/12 0701 01/13 0700   P.O. 690    I.V. (mL/kg) 100 (2)    IV Piggyback     Total Intake(mL/kg) 790 (15.8)    Urine (mL/kg/hr) 1625 (1.4)    Stool 0    Blood     Total Output 1625    Net -835         Stool Occurrence 1 x       Physical Exam: General: NAD.  Laying in bed, calm, comfortable. Very HOH  Resp: No accessory muscle use. On O2 via [place] with sats in the 90s. Cardio: regular rate and rhythm ABD somewhat distended and tympanic to percussion  Neurologically intact MSK Neurovascularly intact Sensation  intact distally Intact pulses distally Dorsiflexion/Plantar flexion intact Incision: dressing C/D/I KI in place  Assessment: 2 Days Post-Op  S/P Procedure(s) (LRB): ARTHROPLASTY BIPOLAR HIP (HEMIARTHROPLASTY) (Left) by Dr. Ernesta Amble. Murphy on 03/10/21  Principal Problem:   Displaced fracture of left femoral neck (HCC) Active Problems:   Hypothyroidism   Hyperlipidemia   Acute respiratory failure with hypoxia (HCC)   Closed fracture of neck of left femur (HCC)   Plan:  I am concerned about her GI complaints. She is repeatedly complaining of her only pain being there vs the hip. She had a BM with blood today that has her concerned about hemorrhoids.  Advance diet Up with therapy Incentive Spirometry Elevate and Apply ice  Weightbearing: WBAT LLE, posterior hip precautions Insicional and dressing care: Dressings left intact until follow-up and Reinforce dressings as needed Orthopedic device(s):  KI to help with posterior hip precautions Showering: Keep dressing dry VTE prophylaxis:  Lovenox 30mg  daily   while inpatient, can switch to ASA 81mg  bid x 30 days upon d/c , SCDs, ambulation Pain control: Tylenol, Tramadol, Naproxen, Norco, Morphine Follow -  up plan: 2 weeks post op Contact information:  Edmonia Lynch MD, Aggie Moats PA-C  Dispo:  PT recommending SNF. Patient and family agreeable to this. Still awaiting bed offers. Trying to see if anything available in [place] or [place] to be closer to family. Safe for discharge from an ortho standpoint. Will print and sign DVT prophylaxis and pain medicine and place in chart for d/c.      Britt Bottom, PA-C Office [phone] 03/12/2021, 8:01 AM

## 2021-03-12 NOTE — Assessment & Plan Note (Signed)
Continue Synthroid °

## 2021-03-12 NOTE — Assessment & Plan Note (Addendum)
Secondary to community-acquired pneumonia as well as atelectasis. Does not use oxygen at baseline. Currently on 2 LPM to maintain adequate saturation. Continue oxygen on discharge.  Continue incentive spirometry. Will continue antibiotics to treat the pneumonia as well. Suspect that the patient has a degree of chronic hypoxia

## 2021-03-12 NOTE — Assessment & Plan Note (Addendum)
Presented with a fall but Transferred from an event hospital. Found to have displaced left femoral neck fracture. 1/10 underwent left hip arthroplasty by Dr. Eulah Pont . Pain control and postoperative DVT prophylaxis per orthopedics. PT OT recommends SNF.

## 2021-03-13 ENCOUNTER — Inpatient Hospital Stay (HOSPITAL_COMMUNITY): Payer: Medicare Other

## 2021-03-13 DIAGNOSIS — S72042D Displaced fracture of base of neck of left femur, subsequent encounter for closed fracture with routine healing: Secondary | ICD-10-CM | POA: Diagnosis not present

## 2021-03-13 DIAGNOSIS — M2550 Pain in unspecified joint: Secondary | ICD-10-CM | POA: Diagnosis not present

## 2021-03-13 DIAGNOSIS — H5789 Other specified disorders of eye and adnexa: Secondary | ICD-10-CM | POA: Diagnosis not present

## 2021-03-13 DIAGNOSIS — R7401 Elevation of levels of liver transaminase levels: Secondary | ICD-10-CM | POA: Diagnosis not present

## 2021-03-13 DIAGNOSIS — R1084 Generalized abdominal pain: Secondary | ICD-10-CM | POA: Diagnosis not present

## 2021-03-13 DIAGNOSIS — E860 Dehydration: Secondary | ICD-10-CM | POA: Diagnosis not present

## 2021-03-13 DIAGNOSIS — I2692 Saddle embolus of pulmonary artery without acute cor pulmonale: Secondary | ICD-10-CM | POA: Diagnosis not present

## 2021-03-13 DIAGNOSIS — D649 Anemia, unspecified: Secondary | ICD-10-CM | POA: Diagnosis not present

## 2021-03-13 DIAGNOSIS — R55 Syncope and collapse: Secondary | ICD-10-CM | POA: Diagnosis not present

## 2021-03-13 DIAGNOSIS — I50811 Acute right heart failure: Secondary | ICD-10-CM | POA: Diagnosis not present

## 2021-03-13 DIAGNOSIS — H409 Unspecified glaucoma: Secondary | ICD-10-CM | POA: Diagnosis not present

## 2021-03-13 DIAGNOSIS — N281 Cyst of kidney, acquired: Secondary | ICD-10-CM | POA: Diagnosis not present

## 2021-03-13 DIAGNOSIS — J984 Other disorders of lung: Secondary | ICD-10-CM | POA: Diagnosis not present

## 2021-03-13 DIAGNOSIS — D72829 Elevated white blood cell count, unspecified: Secondary | ICD-10-CM | POA: Diagnosis not present

## 2021-03-13 DIAGNOSIS — Z7401 Bed confinement status: Secondary | ICD-10-CM | POA: Diagnosis not present

## 2021-03-13 DIAGNOSIS — K929 Disease of digestive system, unspecified: Secondary | ICD-10-CM | POA: Diagnosis not present

## 2021-03-13 DIAGNOSIS — K219 Gastro-esophageal reflux disease without esophagitis: Secondary | ICD-10-CM | POA: Diagnosis not present

## 2021-03-13 DIAGNOSIS — Z8679 Personal history of other diseases of the circulatory system: Secondary | ICD-10-CM | POA: Diagnosis not present

## 2021-03-13 DIAGNOSIS — I2602 Saddle embolus of pulmonary artery with acute cor pulmonale: Secondary | ICD-10-CM | POA: Diagnosis not present

## 2021-03-13 DIAGNOSIS — R404 Transient alteration of awareness: Secondary | ICD-10-CM | POA: Diagnosis not present

## 2021-03-13 DIAGNOSIS — I4891 Unspecified atrial fibrillation: Secondary | ICD-10-CM | POA: Diagnosis not present

## 2021-03-13 DIAGNOSIS — M8088XD Other osteoporosis with current pathological fracture, vertebra(e), subsequent encounter for fracture with routine healing: Secondary | ICD-10-CM | POA: Diagnosis not present

## 2021-03-13 DIAGNOSIS — Z20822 Contact with and (suspected) exposure to covid-19: Secondary | ICD-10-CM | POA: Diagnosis not present

## 2021-03-13 DIAGNOSIS — R739 Hyperglycemia, unspecified: Secondary | ICD-10-CM | POA: Diagnosis not present

## 2021-03-13 DIAGNOSIS — J9690 Respiratory failure, unspecified, unspecified whether with hypoxia or hypercapnia: Secondary | ICD-10-CM | POA: Diagnosis not present

## 2021-03-13 DIAGNOSIS — Z4789 Encounter for other orthopedic aftercare: Secondary | ICD-10-CM | POA: Diagnosis not present

## 2021-03-13 DIAGNOSIS — E785 Hyperlipidemia, unspecified: Secondary | ICD-10-CM | POA: Diagnosis not present

## 2021-03-13 DIAGNOSIS — M810 Age-related osteoporosis without current pathological fracture: Secondary | ICD-10-CM | POA: Diagnosis not present

## 2021-03-13 DIAGNOSIS — S72002D Fracture of unspecified part of neck of left femur, subsequent encounter for closed fracture with routine healing: Secondary | ICD-10-CM | POA: Diagnosis not present

## 2021-03-13 DIAGNOSIS — Z96642 Presence of left artificial hip joint: Secondary | ICD-10-CM | POA: Diagnosis not present

## 2021-03-13 DIAGNOSIS — I517 Cardiomegaly: Secondary | ICD-10-CM | POA: Diagnosis not present

## 2021-03-13 DIAGNOSIS — E039 Hypothyroidism, unspecified: Secondary | ICD-10-CM | POA: Diagnosis not present

## 2021-03-13 DIAGNOSIS — T81718A Complication of other artery following a procedure, not elsewhere classified, initial encounter: Secondary | ICD-10-CM | POA: Diagnosis not present

## 2021-03-13 DIAGNOSIS — Z8673 Personal history of transient ischemic attack (TIA), and cerebral infarction without residual deficits: Secondary | ICD-10-CM | POA: Diagnosis not present

## 2021-03-13 DIAGNOSIS — R000 Tachycardia, unspecified: Secondary | ICD-10-CM | POA: Diagnosis not present

## 2021-03-13 DIAGNOSIS — R6511 Systemic inflammatory response syndrome (SIRS) of non-infectious origin with acute organ dysfunction: Secondary | ICD-10-CM | POA: Diagnosis not present

## 2021-03-13 DIAGNOSIS — R402411 Glasgow coma scale score 13-15, in the field [EMT or ambulance]: Secondary | ICD-10-CM | POA: Diagnosis not present

## 2021-03-13 DIAGNOSIS — K581 Irritable bowel syndrome with constipation: Secondary | ICD-10-CM | POA: Diagnosis not present

## 2021-03-13 DIAGNOSIS — S72002A Fracture of unspecified part of neck of left femur, initial encounter for closed fracture: Secondary | ICD-10-CM | POA: Diagnosis not present

## 2021-03-13 DIAGNOSIS — J9601 Acute respiratory failure with hypoxia: Secondary | ICD-10-CM | POA: Diagnosis not present

## 2021-03-13 DIAGNOSIS — I2609 Other pulmonary embolism with acute cor pulmonale: Secondary | ICD-10-CM | POA: Diagnosis not present

## 2021-03-13 DIAGNOSIS — Z9181 History of falling: Secondary | ICD-10-CM | POA: Diagnosis not present

## 2021-03-13 DIAGNOSIS — N200 Calculus of kidney: Secondary | ICD-10-CM | POA: Diagnosis not present

## 2021-03-13 DIAGNOSIS — R1013 Epigastric pain: Secondary | ICD-10-CM | POA: Diagnosis not present

## 2021-03-13 DIAGNOSIS — E441 Mild protein-calorie malnutrition: Secondary | ICD-10-CM | POA: Diagnosis not present

## 2021-03-13 DIAGNOSIS — I081 Rheumatic disorders of both mitral and tricuspid valves: Secondary | ICD-10-CM | POA: Diagnosis not present

## 2021-03-13 LAB — CBC
HCT: 32.1 % — ABNORMAL LOW (ref 36.0–46.0)
Hemoglobin: 10.3 g/dL — ABNORMAL LOW (ref 12.0–15.0)
MCH: 31.6 pg (ref 26.0–34.0)
MCHC: 32.1 g/dL (ref 30.0–36.0)
MCV: 98.5 fL (ref 80.0–100.0)
Platelets: 156 10*3/uL (ref 150–400)
RBC: 3.26 MIL/uL — ABNORMAL LOW (ref 3.87–5.11)
RDW: 12.9 % (ref 11.5–15.5)
WBC: 5.4 10*3/uL (ref 4.0–10.5)
nRBC: 0 % (ref 0.0–0.2)

## 2021-03-13 LAB — RESP PANEL BY RT-PCR (FLU A&B, COVID) ARPGX2
Influenza A by PCR: NEGATIVE
Influenza B by PCR: NEGATIVE
SARS Coronavirus 2 by RT PCR: NEGATIVE

## 2021-03-13 MED ORDER — AMOXICILLIN-POT CLAVULANATE 875-125 MG PO TABS: 1.0000 | ORAL_TABLET | Freq: Two times a day (BID) | ORAL | 0 refills | Status: AC

## 2021-03-13 NOTE — Plan of Care (Signed)
°  Problem: Clinical Measurements: °Goal: Ability to maintain clinical measurements within normal limits will improve °Outcome: Progressing °  °Problem: Activity: °Goal: Risk for activity intolerance will decrease °Outcome: Progressing °  °Problem: Pain Managment: °Goal: General experience of comfort will improve °Outcome: Progressing °  °Problem: Elimination: °Goal: Will not experience complications related to bowel motility °Outcome: Progressing °  °

## 2021-03-13 NOTE — Discharge Summary (Signed)
Physician Discharge Summary   Patient: Lori George MRN: [id number] DOB: 02-Oct-1932  Admit date:     03/08/2021  Discharge date: 03/13/21  Discharge Physician: Lynden Oxford   PCP: Kari Baars, MD   Recommendations at discharge:    Repeat CBC in 1 week  Discharge Diagnoses Principal Problem:   Closed displaced fracture of left femoral neck (HCC) Active Problems:   Acute postoperative anemia due to expected blood loss   Thrombocytopenia (HCC)   Acute respiratory failure with hypoxia (HCC)   CAP (community acquired pneumonia)   GERD (gastroesophageal reflux disease)   Osteoporosis with multiple compression vertebral fracture   Hyperlipidemia   Hypothyroidism  Resolved Problems:   Displaced fracture of left femoral neck [facility])   Hospital Course   Lori George is a 88 year old female with past medical history significant for hypothyroidism, GERD who initially presented to [facility] ED on 1/8 following fall at home.  Patient's son at bedside who assists with history given her severe pain and hard of hearing.  Patient currently lives alone and is independent of ADLs.  After family left following lunch, patient had apparent fall in the kitchen with associated severe left hip pain.  EMS was called and transported to the ED for further evaluation. 1/8 presented to [facility] with a fall, found to have hip fracture. 1/9 transferred to [facility] long hospital. 1/10 underwent bipolar hip arthroplasty of left hip 1/12 now remains medically stable.  Will require oxygen at discharge.  * Closed displaced fracture of left femoral neck (HCC)- (present on admission) Presented with a fall but Transferred from an event hospital. Found to have displaced left femoral neck fracture. 1/10 underwent left hip arthroplasty by Dr. Eulah Pont . Pain control and postoperative DVT prophylaxis per orthopedics. PT OT recommends SNF.  Acute postoperative anemia due to expected blood loss-  (present on admission) Baseline hemoglobin 13.   Postoperatively around 10.  Stable.   Associated with thrombocytopenia which is also stable.  Likely dilutional in nature.  Monitor.  Acute respiratory failure with hypoxia (HCC)- (present on admission) Secondary to community-acquired pneumonia as well as atelectasis. Does not use oxygen at baseline. Currently on 2 LPM to maintain adequate saturation. Continue oxygen on discharge.  Continue incentive spirometry. Will continue antibiotics to treat the pneumonia as well. Suspect that the patient has a degree of chronic hypoxia  GERD (gastroesophageal reflux disease)- (present on admission) Patient missed her medication and had increased reflux symptoms. Currently on PPI twice daily. Will add short course of Carafate for 14 days as well.  Osteoporosis with multiple compression vertebral fracture- (present on admission) Has chronic pain secondary to compression fractures. Monitor. PT OT consulted. Recommended SNF  Hyperlipidemia- (present on admission) Has a history.  Not on medication.  Hypothyroidism- (present on admission) Continue Synthroid.      Pain control - [employer] Controlled Substance Reporting System database was reviewed. and patient was instructed, not to drive, operate heavy machinery, perform activities at heights, swimming or participation in water activities or provide baby-sitting services while on Pain, Sleep and Anxiety Medications; until their outpatient Physician has advised to do so again. Also recommended to not to take more than prescribed Pain, Sleep and Anxiety Medications.   Consultants: Orthopedics  Procedures performed: hip arthroplasty  Disposition: Skilled nursing facility Diet recommendation: Dysphagia type 3 thin Liquid  DISCHARGE MEDICATION: Allergies as of 03/13/2021   No Known Allergies      Medication List     STOP taking  these medications    diazepam 5 MG tablet Commonly known  as: VALIUM   HYDROcodone-acetaminophen 5-325 MG tablet Commonly known as: NORCO/VICODIN       TAKE these medications    acetaminophen 500 MG tablet Commonly known as: TYLENOL Take 500-1,000 mg by mouth every 6 (six) hours as needed for mild pain or moderate pain. What changed: Another medication with the same name was added. Make sure you understand how and when to take each.   acetaminophen 500 MG tablet Commonly known as: TYLENOL Take 1-2 tablets (500-1,000 mg total) by mouth every 6 (six) hours as needed for mild pain or moderate pain. What changed: You were already taking a medication with the same name, and this prescription was added. Make sure you understand how and when to take each.   amoxicillin-clavulanate 875-125 MG tablet Commonly known as: AUGMENTIN Take 1 tablet by mouth 2 (two) times daily for 1 day.   aspirin EC 81 MG tablet Take 1 tablet (81 mg total) by mouth 2 (two) times daily. For DVT prophylaxis for 30 days after surgery. What changed:  when to take this additional instructions   brimonidine 0.2 % ophthalmic solution Commonly known as: ALPHAGAN Place 1 drop into both eyes 2 (two) times daily.   calcium carbonate 500 MG chewable tablet Commonly known as: TUMS - dosed in mg elemental calcium Chew 1 tablet by mouth daily as needed for heartburn.   CALCIUM PO Take 1 tablet by mouth daily.   Levothyroxine Sodium 75 MCG Caps Take 75 mcg by mouth daily before breakfast.   linaclotide 72 MCG capsule Commonly known as: LINZESS Take 72 mcg by mouth daily before breakfast.   multivitamin with minerals tablet Take 1 tablet by mouth daily.   pantoprazole 40 MG tablet Commonly known as: PROTONIX Take 1 tablet by mouth daily.   Procto-Med HC 2.5 % rectal cream Generic drug: hydrocortisone Place 1 application rectally 2 (two) times daily.   timolol 0.5 % ophthalmic solution Commonly known as: TIMOPTIC Place 1 drop into the right eye daily.    traMADol 50 MG tablet Commonly known as: Ultram Take 1 tablet (50 mg total) by mouth every 6 (six) hours as needed for severe pain.   VITAMIN D (CHOLECALCIFEROL) PO Take 1 capsule by mouth daily.        Follow-up Information     Sheral Apley, MD. Schedule an appointment as soon as possible for a visit in 2 week(s).   Specialty: Orthopedic Surgery Why: for post-op follow up visit Contact information: [address] [place] [address] [phone]         Kari Baars, MD. Schedule an appointment as soon as possible for a visit in 1 week(s).   Specialty: Pulmonary Disease Why: with CBC and BMP Contact information: [address] [place] [address] [phone]                 Discharge Exam: Filed Weights   03/08/21 1906 03/10/21 0842  Weight: 49.9 kg 49.9 kg   Vitals:   03/12/21 1725 03/12/21 2156 03/13/21 0521 03/13/21 0745  BP:  126/69 137/75   Pulse:  70 72   Resp:  16 16   Temp:  98.1 F (36.7 C) 97.7 F (36.5 C)   TempSrc:  Oral Oral   SpO2: 96% 100% 98% 94%  Weight:      Height:        General: Appear in mild distress, no Rash; Oral  Mucosa Clear, moist. no Abnormal Neck Mass Or lumps, Conjunctiva normal  Cardiovascular: S1 and S2 Present, no Murmur, Respiratory: good respiratory effort, Bilateral Air entry present and CTA, basal Crackles, no wheezes Abdomen: Bowel Sound present, Soft and no tenderness Extremities: no Pedal edema Neurology: alert and oriented to time, place, and person affect appropriate. no new focal deficit Gait not checked due to patient safety concerns   Condition at discharge: good  The results of significant diagnostics from this hospitalization (including imaging, microbiology, ancillary and laboratory) are listed below for reference.   Imaging Studies: CT Head Wo Contrast  Result Date: 03/08/2021 CLINICAL DATA:  Fall. Head trauma, intracranial arterial injury suspected EXAM:  CT HEAD WITHOUT CONTRAST TECHNIQUE: Contiguous axial images were obtained from the base of the skull through the vertex without intravenous contrast. COMPARISON:  None. FINDINGS: Brain: No acute intracranial abnormality. Specifically, no hemorrhage, hydrocephalus, mass lesion, acute infarction, or significant intracranial injury. Vascular: No hyperdense vessel or unexpected calcification. Skull: No acute calvarial abnormality. Sinuses/Orbits: No acute findings Other: None IMPRESSION: No acute intracranial abnormality. Electronically Signed   By: Charlett Nose M.D.   On: 03/08/2021 20:34   Pelvis Portable  Result Date: 03/10/2021 CLINICAL DATA:  Post LEFT hip arthroplasty EXAM: PORTABLE PELVIS 1-2 VIEWS COMPARISON:  Portable exam 1229 hours compared to 03/08/2021 FINDINGS: Interval resection of LEFT femoral head/neck and prior fracture with placement of a LEFT hip prosthesis. Diffuse osseous demineralization. No additional fracture or dislocation. Visualized pelvis intact. RIGHT hip joint space preserved. IMPRESSION: LEFT hip arthroplasty without acute osseous findings. Electronically Signed   By: Ulyses Southward M.D.   On: 03/10/2021 12:54   DG CHEST PORT 1 VIEW  Result Date: 03/08/2021 CLINICAL DATA:  Preop hip fracture EXAM: PORTABLE CHEST 1 VIEW COMPARISON:  CT 05/05/2017, chest x-ray 05/05/2017, 07/17/2014 FINDINGS: Mild cardiomegaly. Mild diffuse coarse and slightly reticular interstitial opacity suggesting chronic disease. Patchy airspace opacity at the left base. Stable cardiomediastinal silhouette with aortic atherosclerosis. No visible pneumothorax. Multiple treated compression deformities of the spine. IMPRESSION: Airspace disease at the left lung base may reflect atelectasis, pneumonia or aspiration. Electronically Signed   By: Jasmine Pang M.D.   On: 03/08/2021 21:49   DG ABD ACUTE 2+V W 1V CHEST  Result Date: 03/13/2021 CLINICAL DATA:  Postoperative anemia.  Respiratory failure EXAM: DG ABDOMEN  ACUTE WITH 1 VIEW CHEST COMPARISON:  03/08/2021 chest radiograph FINDINGS: Frontal view of the chest demonstrates patient rotation left. Mild cardiomegaly. No right and no definite left pleural effusion. Increased pulmonary interstitial thickening. Persistent bibasilar airspace disease. Abdominal films demonstrate no free intraperitoneal air or significant air-fluid levels. No gaseous distention of bowel loops on supine imaging. Thoracolumbar vertebral augmentation at multiple levels. Left hip arthroplasty. IMPRESSION: No bowel obstruction or free intraperitoneal air. Persistent bibasilar airspace disease, likely infection or aspiration. Increased pulmonary interstitial thickening which may represent venous congestion. Electronically Signed   By: Jeronimo Greaves M.D.   On: 03/13/2021 11:38   DG Hip Unilat W or Wo Pelvis 2-3 Views Left  Result Date: 03/08/2021 CLINICAL DATA:  Fall with pain. EXAM: DG HIP (WITH OR WITHOUT PELVIS) 2-3V LEFT COMPARISON:  None. FINDINGS: Displaced left femoral neck fracture. Fracture is mildly comminuted with proximal migration of the femoral shaft. Femoral head is well seated in the acetabulum. The bones are under mineralized. Pubic rami are grossly intact. Vertebroplasty within the lower lumbar spine. IMPRESSION: Displaced left femoral neck fracture. Electronically Signed   By: Ivette Loyal.D.  On: 03/08/2021 19:48    Microbiology: Results for orders placed or performed during the hospital encounter of 03/08/21  Resp Panel by RT-PCR (Flu A&B, Covid) Nasopharyngeal Swab     Status: None   Collection Time: 03/08/21  7:13 PM   Specimen: Nasopharyngeal Swab; Nasopharyngeal(NP) swabs in vial transport medium  Result Value Ref Range Status   SARS Coronavirus 2 by RT PCR NEGATIVE NEGATIVE Final    Comment: (NOTE) SARS-CoV-2 target nucleic acids are NOT DETECTED.  The SARS-CoV-2 RNA is generally detectable in upper respiratory specimens during the acute phase of infection.  The lowest concentration of SARS-CoV-2 viral copies this assay can detect is 138 copies/mL. A negative result does not preclude SARS-Cov-2 infection and should not be used as the sole basis for treatment or other patient management decisions. A negative result may occur with  improper specimen collection/handling, submission of specimen other than nasopharyngeal swab, presence of viral mutation(s) within the areas targeted by this assay, and inadequate number of viral copies(<138 copies/mL). A negative result must be combined with clinical observations, patient history, and epidemiological information. The expected result is Negative.  Fact Sheet for Patients:  BloggerCourse.comhttps://www.fda.gov/media/152166/download  Fact Sheet for Healthcare Providers:  SeriousBroker.ithttps://www.fda.gov/media/152162/download  This test is no t yet approved or cleared by the [place] FDA and  has been authorized for detection and/or diagnosis of SARS-CoV-2 by FDA under an Emergency Use Authorization (EUA). This EUA will remain  in effect (meaning this test can be used) for the duration of the COVID-19 declaration under Section 564(b)(1) of the Act, 21 U.S.C.section 360bbb-3(b)(1), unless the authorization is terminated  or revoked sooner.       Influenza A by PCR NEGATIVE NEGATIVE Final   Influenza B by PCR NEGATIVE NEGATIVE Final    Comment: (NOTE) The Xpert Xpress SARS-CoV-2/FLU/RSV plus assay is intended as an aid in the diagnosis of influenza from Nasopharyngeal swab specimens and should not be used as a sole basis for treatment. Nasal washings and aspirates are unacceptable for Xpert Xpress SARS-CoV-2/FLU/RSV testing.  Fact Sheet for Patients: BloggerCourse.comhttps://www.fda.gov/media/152166/download  Fact Sheet for Healthcare Providers: SeriousBroker.ithttps://www.fda.gov/media/152162/download  This test is not yet approved or cleared by the [place] FDA and has been authorized for detection and/or diagnosis of SARS-CoV-2 by FDA  under an Emergency Use Authorization (EUA). This EUA will remain in effect (meaning this test can be used) for the duration of the COVID-19 declaration under Section 564(b)(1) of the Act, 21 U.S.C. section 360bbb-3(b)(1), unless the authorization is terminated or revoked.  Performed at [facility], [address] [place], [place] [address]   Surgical pcr screen     Status: None   Collection Time: 03/09/21  8:58 PM   Specimen: Nasal Mucosa; Nasal Swab  Result Value Ref Range Status   MRSA, PCR NEGATIVE NEGATIVE Final   Staphylococcus aureus NEGATIVE NEGATIVE Final    Comment: (NOTE) The Xpert SA Assay (FDA approved for NASAL specimens in patients 86 years of age and older), is one component of a comprehensive surveillance program. It is not intended to diagnose infection nor to guide or monitor treatment. Performed at [facility], [address] [place], [place] [address]     Labs: CBC: Recent Labs  Lab 03/08/21 1928 03/09/21 0314 03/11/21 0312 03/12/21 0321 03/13/21 0318  WBC 11.2* 12.3* 9.8 6.5 5.4  NEUTROABS 9.6*  --   --   --   --   HGB 13.4 12.2 10.5* 10.3* 10.3*  HCT 41.3 37.9 32.9* 31.3*  32.1*  MCV 97.6 99.2 100.6* 97.8 98.5  PLT 223 195 133* 137* 156   Basic Metabolic Panel: Recent Labs  Lab 03/08/21 1928 03/09/21 0314 03/11/21 0312 03/12/21 0321  NA 140 140 137 138  K 4.2 4.4 4.5 3.9  CL 106 110 109 107  CO2 26 24 24 26   GLUCOSE 134* 189* 122* 111*  BUN 30* 28* 25* 21  CREATININE 0.87 0.82 0.75 0.73  CALCIUM 8.8* 7.9* 7.6* 8.0*   Liver Function Tests: Recent Labs  Lab 03/08/21 1928  AST 22  ALT 21  ALKPHOS 48  BILITOT 0.6  PROT 6.4*  ALBUMIN 4.0   CBG: No results for input(s): GLUCAP in the last 168 hours.  Discharge time spent: greater than 30 minutes.  Signed: Lynden OxfordPranav Divine Imber, MD [facility] 03/13/2021

## 2021-03-13 NOTE — Plan of Care (Signed)
Report called to Belcher, Charity fundraiser at [employer].

## 2021-03-13 NOTE — TOC Transition Note (Signed)
Transition of Care [facility]) - CM/SW Discharge Note  Patient Details  Name: Lori George MRN: [id number] Date of Birth: 10-06-1932  Transition of [facility]) CM/SW Contact:  Ewing Schlein, LCSW Phone Number: 03/13/2021, 1:55 PM  Clinical Narrative: Lowella Petties made bed offer and family was updated. Insurance authorization completed. [employer] reference ID # is: [id number]. Patient has been approved for 03/13/2021-03/17/2021. Discharge summary and COVID results sent to [facility] in admissions at [facility]. COVID test is negative.  Medical necessity form done; PTAR scheduled. Discharge packet completed. CSW updated son regarding discharge. RN updated. TOC signing off.  Final next level of care: Skilled Nursing Facility Barriers to Discharge: Barriers Resolved  Patient Goals and CMS Choice Patient states their goals for this hospitalization and ongoing recovery are:: Go to [facility].gov Compare Post Acute Care list provided to:: Patient Represenative (must comment) Choice offered to / list presented to : Adult Children  Discharge Placement PASRR number recieved: 03/11/21 Patient [facility] bed at: [facility] Patient to be transferred to facility by: PTAR Name of family member notified: Paola Aleshire (son) Patient and family notified of of transfer: 03/13/21  Discharge Plan and Services In-house Referral: Clinical Social Work     DME Arranged: N/A DME Agency: NA  Readmission Risk Interventions No flowsheet data found.

## 2021-03-17 DIAGNOSIS — R1013 Epigastric pain: Secondary | ICD-10-CM | POA: Diagnosis not present

## 2021-03-17 DIAGNOSIS — R739 Hyperglycemia, unspecified: Secondary | ICD-10-CM | POA: Diagnosis not present

## 2021-03-17 DIAGNOSIS — R69 Illness, unspecified: Secondary | ICD-10-CM | POA: Diagnosis not present

## 2021-03-17 DIAGNOSIS — I50811 Acute right heart failure: Secondary | ICD-10-CM | POA: Diagnosis not present

## 2021-03-17 DIAGNOSIS — I2609 Other pulmonary embolism with acute cor pulmonale: Secondary | ICD-10-CM | POA: Diagnosis not present

## 2021-03-17 DIAGNOSIS — R7401 Elevation of levels of liver transaminase levels: Secondary | ICD-10-CM | POA: Diagnosis not present

## 2021-03-17 DIAGNOSIS — Z9181 History of falling: Secondary | ICD-10-CM | POA: Diagnosis not present

## 2021-03-17 DIAGNOSIS — E860 Dehydration: Secondary | ICD-10-CM | POA: Diagnosis not present

## 2021-03-17 DIAGNOSIS — Z8679 Personal history of other diseases of the circulatory system: Secondary | ICD-10-CM | POA: Diagnosis not present

## 2021-03-17 DIAGNOSIS — R0602 Shortness of breath: Secondary | ICD-10-CM | POA: Diagnosis not present

## 2021-03-17 DIAGNOSIS — Z20822 Contact with and (suspected) exposure to covid-19: Secondary | ICD-10-CM | POA: Diagnosis not present

## 2021-03-17 DIAGNOSIS — R1084 Generalized abdominal pain: Secondary | ICD-10-CM | POA: Diagnosis not present

## 2021-03-17 DIAGNOSIS — M810 Age-related osteoporosis without current pathological fracture: Secondary | ICD-10-CM | POA: Diagnosis not present

## 2021-03-17 DIAGNOSIS — E441 Mild protein-calorie malnutrition: Secondary | ICD-10-CM | POA: Diagnosis not present

## 2021-03-17 DIAGNOSIS — T81718A Complication of other artery following a procedure, not elsewhere classified, initial encounter: Secondary | ICD-10-CM | POA: Diagnosis not present

## 2021-03-17 DIAGNOSIS — R000 Tachycardia, unspecified: Secondary | ICD-10-CM | POA: Diagnosis not present

## 2021-03-17 DIAGNOSIS — H409 Unspecified glaucoma: Secondary | ICD-10-CM | POA: Diagnosis not present

## 2021-03-17 DIAGNOSIS — I340 Nonrheumatic mitral (valve) insufficiency: Secondary | ICD-10-CM | POA: Diagnosis not present

## 2021-03-17 DIAGNOSIS — R402411 Glasgow coma scale score 13-15, in the field [EMT or ambulance]: Secondary | ICD-10-CM | POA: Diagnosis not present

## 2021-03-17 DIAGNOSIS — Z96642 Presence of left artificial hip joint: Secondary | ICD-10-CM | POA: Diagnosis not present

## 2021-03-17 DIAGNOSIS — I2692 Saddle embolus of pulmonary artery without acute cor pulmonale: Secondary | ICD-10-CM | POA: Diagnosis not present

## 2021-03-17 DIAGNOSIS — E039 Hypothyroidism, unspecified: Secondary | ICD-10-CM | POA: Diagnosis not present

## 2021-03-17 DIAGNOSIS — N200 Calculus of kidney: Secondary | ICD-10-CM | POA: Diagnosis not present

## 2021-03-17 DIAGNOSIS — D72829 Elevated white blood cell count, unspecified: Secondary | ICD-10-CM | POA: Diagnosis not present

## 2021-03-17 DIAGNOSIS — I081 Rheumatic disorders of both mitral and tricuspid valves: Secondary | ICD-10-CM | POA: Diagnosis not present

## 2021-03-17 DIAGNOSIS — Z8673 Personal history of transient ischemic attack (TIA), and cerebral infarction without residual deficits: Secondary | ICD-10-CM | POA: Diagnosis not present

## 2021-03-17 DIAGNOSIS — K929 Disease of digestive system, unspecified: Secondary | ICD-10-CM | POA: Diagnosis not present

## 2021-03-17 DIAGNOSIS — I361 Nonrheumatic tricuspid (valve) insufficiency: Secondary | ICD-10-CM | POA: Diagnosis not present

## 2021-03-17 DIAGNOSIS — S72002D Fracture of unspecified part of neck of left femur, subsequent encounter for closed fracture with routine healing: Secondary | ICD-10-CM | POA: Diagnosis not present

## 2021-03-17 DIAGNOSIS — Z7401 Bed confinement status: Secondary | ICD-10-CM | POA: Diagnosis not present

## 2021-03-17 DIAGNOSIS — Z4789 Encounter for other orthopedic aftercare: Secondary | ICD-10-CM | POA: Diagnosis not present

## 2021-03-17 DIAGNOSIS — I4891 Unspecified atrial fibrillation: Secondary | ICD-10-CM | POA: Diagnosis not present

## 2021-03-17 DIAGNOSIS — R404 Transient alteration of awareness: Secondary | ICD-10-CM | POA: Diagnosis not present

## 2021-03-17 DIAGNOSIS — R6511 Systemic inflammatory response syndrome (SIRS) of non-infectious origin with acute organ dysfunction: Secondary | ICD-10-CM | POA: Diagnosis not present

## 2021-03-17 DIAGNOSIS — H5789 Other specified disorders of eye and adnexa: Secondary | ICD-10-CM | POA: Diagnosis not present

## 2021-03-17 DIAGNOSIS — S72042D Displaced fracture of base of neck of left femur, subsequent encounter for closed fracture with routine healing: Secondary | ICD-10-CM | POA: Diagnosis not present

## 2021-03-17 DIAGNOSIS — J9601 Acute respiratory failure with hypoxia: Secondary | ICD-10-CM | POA: Diagnosis not present

## 2021-03-17 DIAGNOSIS — M8088XD Other osteoporosis with current pathological fracture, vertebra(e), subsequent encounter for fracture with routine healing: Secondary | ICD-10-CM | POA: Diagnosis not present

## 2021-03-17 DIAGNOSIS — I2602 Saddle embolus of pulmonary artery with acute cor pulmonale: Secondary | ICD-10-CM | POA: Diagnosis not present

## 2021-03-17 DIAGNOSIS — I2694 Multiple subsegmental pulmonary emboli without acute cor pulmonale: Secondary | ICD-10-CM | POA: Diagnosis not present

## 2021-03-17 DIAGNOSIS — E785 Hyperlipidemia, unspecified: Secondary | ICD-10-CM | POA: Diagnosis not present

## 2021-03-17 DIAGNOSIS — N281 Cyst of kidney, acquired: Secondary | ICD-10-CM | POA: Diagnosis not present

## 2021-03-17 DIAGNOSIS — K581 Irritable bowel syndrome with constipation: Secondary | ICD-10-CM | POA: Diagnosis not present

## 2021-03-17 DIAGNOSIS — R55 Syncope and collapse: Secondary | ICD-10-CM | POA: Diagnosis not present

## 2021-03-17 DIAGNOSIS — I371 Nonrheumatic pulmonary valve insufficiency: Secondary | ICD-10-CM | POA: Diagnosis not present

## 2021-03-17 DIAGNOSIS — K219 Gastro-esophageal reflux disease without esophagitis: Secondary | ICD-10-CM | POA: Diagnosis not present

## 2021-03-18 DIAGNOSIS — I2692 Saddle embolus of pulmonary artery without acute cor pulmonale: Secondary | ICD-10-CM | POA: Diagnosis not present

## 2021-03-19 DIAGNOSIS — T81718A Complication of other artery following a procedure, not elsewhere classified, initial encounter: Secondary | ICD-10-CM | POA: Diagnosis not present

## 2021-03-19 DIAGNOSIS — I2692 Saddle embolus of pulmonary artery without acute cor pulmonale: Secondary | ICD-10-CM | POA: Diagnosis not present

## 2021-03-20 DIAGNOSIS — E785 Hyperlipidemia, unspecified: Secondary | ICD-10-CM | POA: Diagnosis not present

## 2021-03-20 DIAGNOSIS — S72042D Displaced fracture of base of neck of left femur, subsequent encounter for closed fracture with routine healing: Secondary | ICD-10-CM | POA: Diagnosis not present

## 2021-03-20 DIAGNOSIS — M7062 Trochanteric bursitis, left hip: Secondary | ICD-10-CM | POA: Diagnosis not present

## 2021-03-20 DIAGNOSIS — E039 Hypothyroidism, unspecified: Secondary | ICD-10-CM | POA: Diagnosis not present

## 2021-03-20 DIAGNOSIS — Z4789 Encounter for other orthopedic aftercare: Secondary | ICD-10-CM | POA: Diagnosis not present

## 2021-03-20 DIAGNOSIS — M8088XD Other osteoporosis with current pathological fracture, vertebra(e), subsequent encounter for fracture with routine healing: Secondary | ICD-10-CM | POA: Diagnosis not present

## 2021-03-20 DIAGNOSIS — I2692 Saddle embolus of pulmonary artery without acute cor pulmonale: Secondary | ICD-10-CM | POA: Diagnosis not present

## 2021-03-20 DIAGNOSIS — R69 Illness, unspecified: Secondary | ICD-10-CM | POA: Diagnosis not present

## 2021-03-20 DIAGNOSIS — K929 Disease of digestive system, unspecified: Secondary | ICD-10-CM | POA: Diagnosis not present

## 2021-03-20 DIAGNOSIS — T81718A Complication of other artery following a procedure, not elsewhere classified, initial encounter: Secondary | ICD-10-CM | POA: Diagnosis not present

## 2021-03-20 DIAGNOSIS — R5381 Other malaise: Secondary | ICD-10-CM | POA: Diagnosis not present

## 2021-03-20 DIAGNOSIS — J9601 Acute respiratory failure with hypoxia: Secondary | ICD-10-CM | POA: Diagnosis not present

## 2021-03-20 DIAGNOSIS — R0602 Shortness of breath: Secondary | ICD-10-CM | POA: Diagnosis not present

## 2021-03-20 DIAGNOSIS — Z7401 Bed confinement status: Secondary | ICD-10-CM | POA: Diagnosis not present

## 2021-03-20 DIAGNOSIS — I739 Peripheral vascular disease, unspecified: Secondary | ICD-10-CM | POA: Diagnosis not present

## 2021-03-20 DIAGNOSIS — L0390 Cellulitis, unspecified: Secondary | ICD-10-CM | POA: Diagnosis not present

## 2021-03-20 DIAGNOSIS — I2694 Multiple subsegmental pulmonary emboli without acute cor pulmonale: Secondary | ICD-10-CM | POA: Diagnosis not present

## 2021-03-20 DIAGNOSIS — I10 Essential (primary) hypertension: Secondary | ICD-10-CM | POA: Diagnosis not present

## 2021-03-20 DIAGNOSIS — H5789 Other specified disorders of eye and adnexa: Secondary | ICD-10-CM | POA: Diagnosis not present

## 2021-03-22 DIAGNOSIS — I10 Essential (primary) hypertension: Secondary | ICD-10-CM | POA: Diagnosis not present

## 2021-03-22 DIAGNOSIS — I739 Peripheral vascular disease, unspecified: Secondary | ICD-10-CM | POA: Diagnosis not present

## 2021-03-22 DIAGNOSIS — Z4789 Encounter for other orthopedic aftercare: Secondary | ICD-10-CM | POA: Diagnosis not present

## 2021-03-22 DIAGNOSIS — R5381 Other malaise: Secondary | ICD-10-CM | POA: Diagnosis not present

## 2021-03-24 DIAGNOSIS — L0390 Cellulitis, unspecified: Secondary | ICD-10-CM | POA: Diagnosis not present

## 2021-03-25 DIAGNOSIS — M7062 Trochanteric bursitis, left hip: Secondary | ICD-10-CM | POA: Diagnosis not present

## 2021-03-31 DIAGNOSIS — L0390 Cellulitis, unspecified: Secondary | ICD-10-CM | POA: Diagnosis not present

## 2021-04-02 DIAGNOSIS — L0390 Cellulitis, unspecified: Secondary | ICD-10-CM | POA: Diagnosis not present

## 2021-04-03 DIAGNOSIS — S72042D Displaced fracture of base of neck of left femur, subsequent encounter for closed fracture with routine healing: Secondary | ICD-10-CM | POA: Diagnosis not present

## 2021-04-04 DIAGNOSIS — E039 Hypothyroidism, unspecified: Secondary | ICD-10-CM | POA: Diagnosis not present

## 2021-04-04 DIAGNOSIS — Z96642 Presence of left artificial hip joint: Secondary | ICD-10-CM | POA: Diagnosis not present

## 2021-04-04 DIAGNOSIS — I2692 Saddle embolus of pulmonary artery without acute cor pulmonale: Secondary | ICD-10-CM | POA: Diagnosis not present

## 2021-04-04 DIAGNOSIS — W19XXXD Unspecified fall, subsequent encounter: Secondary | ICD-10-CM | POA: Diagnosis not present

## 2021-04-04 DIAGNOSIS — E119 Type 2 diabetes mellitus without complications: Secondary | ICD-10-CM | POA: Diagnosis not present

## 2021-04-04 DIAGNOSIS — I10 Essential (primary) hypertension: Secondary | ICD-10-CM | POA: Diagnosis not present

## 2021-04-04 DIAGNOSIS — K581 Irritable bowel syndrome with constipation: Secondary | ICD-10-CM | POA: Diagnosis not present

## 2021-04-04 DIAGNOSIS — Z8673 Personal history of transient ischemic attack (TIA), and cerebral infarction without residual deficits: Secondary | ICD-10-CM | POA: Diagnosis not present

## 2021-04-04 DIAGNOSIS — E441 Mild protein-calorie malnutrition: Secondary | ICD-10-CM | POA: Diagnosis not present

## 2021-04-04 DIAGNOSIS — Z7901 Long term (current) use of anticoagulants: Secondary | ICD-10-CM | POA: Diagnosis not present

## 2021-04-04 DIAGNOSIS — M80852D Other osteoporosis with current pathological fracture, left femur, subsequent encounter for fracture with routine healing: Secondary | ICD-10-CM | POA: Diagnosis not present

## 2021-04-04 DIAGNOSIS — K219 Gastro-esophageal reflux disease without esophagitis: Secondary | ICD-10-CM | POA: Diagnosis not present

## 2021-04-04 DIAGNOSIS — H409 Unspecified glaucoma: Secondary | ICD-10-CM | POA: Diagnosis not present

## 2021-04-04 DIAGNOSIS — I5189 Other ill-defined heart diseases: Secondary | ICD-10-CM | POA: Diagnosis not present

## 2021-04-04 DIAGNOSIS — Z681 Body mass index (BMI) 19 or less, adult: Secondary | ICD-10-CM | POA: Diagnosis not present

## 2021-04-04 DIAGNOSIS — E785 Hyperlipidemia, unspecified: Secondary | ICD-10-CM | POA: Diagnosis not present

## 2021-04-07 DIAGNOSIS — K219 Gastro-esophageal reflux disease without esophagitis: Secondary | ICD-10-CM | POA: Diagnosis not present

## 2021-04-07 DIAGNOSIS — J9601 Acute respiratory failure with hypoxia: Secondary | ICD-10-CM | POA: Diagnosis not present

## 2021-04-07 DIAGNOSIS — R55 Syncope and collapse: Secondary | ICD-10-CM | POA: Diagnosis not present

## 2021-04-07 DIAGNOSIS — M1990 Unspecified osteoarthritis, unspecified site: Secondary | ICD-10-CM | POA: Diagnosis not present

## 2021-04-07 DIAGNOSIS — S72002A Fracture of unspecified part of neck of left femur, initial encounter for closed fracture: Secondary | ICD-10-CM | POA: Diagnosis not present

## 2021-04-07 DIAGNOSIS — I2699 Other pulmonary embolism without acute cor pulmonale: Secondary | ICD-10-CM | POA: Diagnosis not present

## 2021-04-09 DIAGNOSIS — E039 Hypothyroidism, unspecified: Secondary | ICD-10-CM | POA: Diagnosis not present

## 2021-04-09 DIAGNOSIS — I480 Paroxysmal atrial fibrillation: Secondary | ICD-10-CM | POA: Diagnosis not present

## 2021-04-09 DIAGNOSIS — Z86711 Personal history of pulmonary embolism: Secondary | ICD-10-CM | POA: Diagnosis not present

## 2021-04-09 DIAGNOSIS — Z136 Encounter for screening for cardiovascular disorders: Secondary | ICD-10-CM | POA: Diagnosis not present

## 2021-04-10 DIAGNOSIS — K219 Gastro-esophageal reflux disease without esophagitis: Secondary | ICD-10-CM | POA: Diagnosis not present

## 2021-04-10 DIAGNOSIS — Z96642 Presence of left artificial hip joint: Secondary | ICD-10-CM | POA: Diagnosis not present

## 2021-04-10 DIAGNOSIS — E119 Type 2 diabetes mellitus without complications: Secondary | ICD-10-CM | POA: Diagnosis not present

## 2021-04-10 DIAGNOSIS — E785 Hyperlipidemia, unspecified: Secondary | ICD-10-CM | POA: Diagnosis not present

## 2021-04-10 DIAGNOSIS — Z8673 Personal history of transient ischemic attack (TIA), and cerebral infarction without residual deficits: Secondary | ICD-10-CM | POA: Diagnosis not present

## 2021-04-10 DIAGNOSIS — I5189 Other ill-defined heart diseases: Secondary | ICD-10-CM | POA: Diagnosis not present

## 2021-04-10 DIAGNOSIS — H409 Unspecified glaucoma: Secondary | ICD-10-CM | POA: Diagnosis not present

## 2021-04-10 DIAGNOSIS — K581 Irritable bowel syndrome with constipation: Secondary | ICD-10-CM | POA: Diagnosis not present

## 2021-04-10 DIAGNOSIS — E039 Hypothyroidism, unspecified: Secondary | ICD-10-CM | POA: Diagnosis not present

## 2021-04-10 DIAGNOSIS — I10 Essential (primary) hypertension: Secondary | ICD-10-CM | POA: Diagnosis not present

## 2021-04-10 DIAGNOSIS — I2692 Saddle embolus of pulmonary artery without acute cor pulmonale: Secondary | ICD-10-CM | POA: Diagnosis not present

## 2021-04-10 DIAGNOSIS — M80852D Other osteoporosis with current pathological fracture, left femur, subsequent encounter for fracture with routine healing: Secondary | ICD-10-CM | POA: Diagnosis not present

## 2021-04-10 DIAGNOSIS — Z7901 Long term (current) use of anticoagulants: Secondary | ICD-10-CM | POA: Diagnosis not present

## 2021-04-10 DIAGNOSIS — Z681 Body mass index (BMI) 19 or less, adult: Secondary | ICD-10-CM | POA: Diagnosis not present

## 2021-04-10 DIAGNOSIS — E441 Mild protein-calorie malnutrition: Secondary | ICD-10-CM | POA: Diagnosis not present

## 2021-04-10 DIAGNOSIS — W19XXXD Unspecified fall, subsequent encounter: Secondary | ICD-10-CM | POA: Diagnosis not present

## 2021-04-13 DIAGNOSIS — K581 Irritable bowel syndrome with constipation: Secondary | ICD-10-CM | POA: Diagnosis not present

## 2021-04-13 DIAGNOSIS — Z8673 Personal history of transient ischemic attack (TIA), and cerebral infarction without residual deficits: Secondary | ICD-10-CM | POA: Diagnosis not present

## 2021-04-13 DIAGNOSIS — E441 Mild protein-calorie malnutrition: Secondary | ICD-10-CM | POA: Diagnosis not present

## 2021-04-13 DIAGNOSIS — Z7901 Long term (current) use of anticoagulants: Secondary | ICD-10-CM | POA: Diagnosis not present

## 2021-04-13 DIAGNOSIS — E785 Hyperlipidemia, unspecified: Secondary | ICD-10-CM | POA: Diagnosis not present

## 2021-04-13 DIAGNOSIS — Z96642 Presence of left artificial hip joint: Secondary | ICD-10-CM | POA: Diagnosis not present

## 2021-04-13 DIAGNOSIS — Z681 Body mass index (BMI) 19 or less, adult: Secondary | ICD-10-CM | POA: Diagnosis not present

## 2021-04-13 DIAGNOSIS — M80852D Other osteoporosis with current pathological fracture, left femur, subsequent encounter for fracture with routine healing: Secondary | ICD-10-CM | POA: Diagnosis not present

## 2021-04-13 DIAGNOSIS — W19XXXD Unspecified fall, subsequent encounter: Secondary | ICD-10-CM | POA: Diagnosis not present

## 2021-04-13 DIAGNOSIS — K219 Gastro-esophageal reflux disease without esophagitis: Secondary | ICD-10-CM | POA: Diagnosis not present

## 2021-04-13 DIAGNOSIS — I5189 Other ill-defined heart diseases: Secondary | ICD-10-CM | POA: Diagnosis not present

## 2021-04-13 DIAGNOSIS — E119 Type 2 diabetes mellitus without complications: Secondary | ICD-10-CM | POA: Diagnosis not present

## 2021-04-13 DIAGNOSIS — E039 Hypothyroidism, unspecified: Secondary | ICD-10-CM | POA: Diagnosis not present

## 2021-04-13 DIAGNOSIS — I10 Essential (primary) hypertension: Secondary | ICD-10-CM | POA: Diagnosis not present

## 2021-04-13 DIAGNOSIS — I2692 Saddle embolus of pulmonary artery without acute cor pulmonale: Secondary | ICD-10-CM | POA: Diagnosis not present

## 2021-04-13 DIAGNOSIS — H409 Unspecified glaucoma: Secondary | ICD-10-CM | POA: Diagnosis not present

## 2021-04-14 DIAGNOSIS — K219 Gastro-esophageal reflux disease without esophagitis: Secondary | ICD-10-CM | POA: Diagnosis not present

## 2021-04-14 DIAGNOSIS — H409 Unspecified glaucoma: Secondary | ICD-10-CM | POA: Diagnosis not present

## 2021-04-14 DIAGNOSIS — Z681 Body mass index (BMI) 19 or less, adult: Secondary | ICD-10-CM | POA: Diagnosis not present

## 2021-04-14 DIAGNOSIS — I10 Essential (primary) hypertension: Secondary | ICD-10-CM | POA: Diagnosis not present

## 2021-04-14 DIAGNOSIS — E441 Mild protein-calorie malnutrition: Secondary | ICD-10-CM | POA: Diagnosis not present

## 2021-04-14 DIAGNOSIS — Z96642 Presence of left artificial hip joint: Secondary | ICD-10-CM | POA: Diagnosis not present

## 2021-04-14 DIAGNOSIS — E119 Type 2 diabetes mellitus without complications: Secondary | ICD-10-CM | POA: Diagnosis not present

## 2021-04-14 DIAGNOSIS — M80852D Other osteoporosis with current pathological fracture, left femur, subsequent encounter for fracture with routine healing: Secondary | ICD-10-CM | POA: Diagnosis not present

## 2021-04-14 DIAGNOSIS — W19XXXD Unspecified fall, subsequent encounter: Secondary | ICD-10-CM | POA: Diagnosis not present

## 2021-04-14 DIAGNOSIS — E039 Hypothyroidism, unspecified: Secondary | ICD-10-CM | POA: Diagnosis not present

## 2021-04-14 DIAGNOSIS — Z7901 Long term (current) use of anticoagulants: Secondary | ICD-10-CM | POA: Diagnosis not present

## 2021-04-14 DIAGNOSIS — E785 Hyperlipidemia, unspecified: Secondary | ICD-10-CM | POA: Diagnosis not present

## 2021-04-14 DIAGNOSIS — K581 Irritable bowel syndrome with constipation: Secondary | ICD-10-CM | POA: Diagnosis not present

## 2021-04-14 DIAGNOSIS — Z8673 Personal history of transient ischemic attack (TIA), and cerebral infarction without residual deficits: Secondary | ICD-10-CM | POA: Diagnosis not present

## 2021-04-14 DIAGNOSIS — I5189 Other ill-defined heart diseases: Secondary | ICD-10-CM | POA: Diagnosis not present

## 2021-04-14 DIAGNOSIS — I2692 Saddle embolus of pulmonary artery without acute cor pulmonale: Secondary | ICD-10-CM | POA: Diagnosis not present

## 2021-04-17 DIAGNOSIS — H409 Unspecified glaucoma: Secondary | ICD-10-CM | POA: Diagnosis not present

## 2021-04-17 DIAGNOSIS — Z681 Body mass index (BMI) 19 or less, adult: Secondary | ICD-10-CM | POA: Diagnosis not present

## 2021-04-17 DIAGNOSIS — E119 Type 2 diabetes mellitus without complications: Secondary | ICD-10-CM | POA: Diagnosis not present

## 2021-04-17 DIAGNOSIS — I10 Essential (primary) hypertension: Secondary | ICD-10-CM | POA: Diagnosis not present

## 2021-04-17 DIAGNOSIS — M80852D Other osteoporosis with current pathological fracture, left femur, subsequent encounter for fracture with routine healing: Secondary | ICD-10-CM | POA: Diagnosis not present

## 2021-04-17 DIAGNOSIS — I2692 Saddle embolus of pulmonary artery without acute cor pulmonale: Secondary | ICD-10-CM | POA: Diagnosis not present

## 2021-04-17 DIAGNOSIS — Z8673 Personal history of transient ischemic attack (TIA), and cerebral infarction without residual deficits: Secondary | ICD-10-CM | POA: Diagnosis not present

## 2021-04-17 DIAGNOSIS — E039 Hypothyroidism, unspecified: Secondary | ICD-10-CM | POA: Diagnosis not present

## 2021-04-17 DIAGNOSIS — Z96642 Presence of left artificial hip joint: Secondary | ICD-10-CM | POA: Diagnosis not present

## 2021-04-17 DIAGNOSIS — W19XXXD Unspecified fall, subsequent encounter: Secondary | ICD-10-CM | POA: Diagnosis not present

## 2021-04-17 DIAGNOSIS — K581 Irritable bowel syndrome with constipation: Secondary | ICD-10-CM | POA: Diagnosis not present

## 2021-04-17 DIAGNOSIS — I5189 Other ill-defined heart diseases: Secondary | ICD-10-CM | POA: Diagnosis not present

## 2021-04-17 DIAGNOSIS — K219 Gastro-esophageal reflux disease without esophagitis: Secondary | ICD-10-CM | POA: Diagnosis not present

## 2021-04-17 DIAGNOSIS — E785 Hyperlipidemia, unspecified: Secondary | ICD-10-CM | POA: Diagnosis not present

## 2021-04-17 DIAGNOSIS — Z7901 Long term (current) use of anticoagulants: Secondary | ICD-10-CM | POA: Diagnosis not present

## 2021-04-17 DIAGNOSIS — E441 Mild protein-calorie malnutrition: Secondary | ICD-10-CM | POA: Diagnosis not present

## 2021-04-21 DIAGNOSIS — K581 Irritable bowel syndrome with constipation: Secondary | ICD-10-CM | POA: Diagnosis not present

## 2021-04-21 DIAGNOSIS — E039 Hypothyroidism, unspecified: Secondary | ICD-10-CM | POA: Diagnosis not present

## 2021-04-21 DIAGNOSIS — I2692 Saddle embolus of pulmonary artery without acute cor pulmonale: Secondary | ICD-10-CM | POA: Diagnosis not present

## 2021-04-21 DIAGNOSIS — Z7901 Long term (current) use of anticoagulants: Secondary | ICD-10-CM | POA: Diagnosis not present

## 2021-04-21 DIAGNOSIS — W19XXXD Unspecified fall, subsequent encounter: Secondary | ICD-10-CM | POA: Diagnosis not present

## 2021-04-21 DIAGNOSIS — K219 Gastro-esophageal reflux disease without esophagitis: Secondary | ICD-10-CM | POA: Diagnosis not present

## 2021-04-21 DIAGNOSIS — E785 Hyperlipidemia, unspecified: Secondary | ICD-10-CM | POA: Diagnosis not present

## 2021-04-21 DIAGNOSIS — H409 Unspecified glaucoma: Secondary | ICD-10-CM | POA: Diagnosis not present

## 2021-04-21 DIAGNOSIS — I5189 Other ill-defined heart diseases: Secondary | ICD-10-CM | POA: Diagnosis not present

## 2021-04-21 DIAGNOSIS — M80852D Other osteoporosis with current pathological fracture, left femur, subsequent encounter for fracture with routine healing: Secondary | ICD-10-CM | POA: Diagnosis not present

## 2021-04-21 DIAGNOSIS — I10 Essential (primary) hypertension: Secondary | ICD-10-CM | POA: Diagnosis not present

## 2021-04-21 DIAGNOSIS — Z96642 Presence of left artificial hip joint: Secondary | ICD-10-CM | POA: Diagnosis not present

## 2021-04-21 DIAGNOSIS — E441 Mild protein-calorie malnutrition: Secondary | ICD-10-CM | POA: Diagnosis not present

## 2021-04-21 DIAGNOSIS — Z8673 Personal history of transient ischemic attack (TIA), and cerebral infarction without residual deficits: Secondary | ICD-10-CM | POA: Diagnosis not present

## 2021-04-21 DIAGNOSIS — E119 Type 2 diabetes mellitus without complications: Secondary | ICD-10-CM | POA: Diagnosis not present

## 2021-04-21 DIAGNOSIS — Z681 Body mass index (BMI) 19 or less, adult: Secondary | ICD-10-CM | POA: Diagnosis not present

## 2021-04-22 DIAGNOSIS — Z8673 Personal history of transient ischemic attack (TIA), and cerebral infarction without residual deficits: Secondary | ICD-10-CM | POA: Diagnosis not present

## 2021-04-22 DIAGNOSIS — M80852D Other osteoporosis with current pathological fracture, left femur, subsequent encounter for fracture with routine healing: Secondary | ICD-10-CM | POA: Diagnosis not present

## 2021-04-22 DIAGNOSIS — Z7901 Long term (current) use of anticoagulants: Secondary | ICD-10-CM | POA: Diagnosis not present

## 2021-04-22 DIAGNOSIS — H409 Unspecified glaucoma: Secondary | ICD-10-CM | POA: Diagnosis not present

## 2021-04-22 DIAGNOSIS — W19XXXD Unspecified fall, subsequent encounter: Secondary | ICD-10-CM | POA: Diagnosis not present

## 2021-04-22 DIAGNOSIS — E785 Hyperlipidemia, unspecified: Secondary | ICD-10-CM | POA: Diagnosis not present

## 2021-04-22 DIAGNOSIS — M7062 Trochanteric bursitis, left hip: Secondary | ICD-10-CM | POA: Diagnosis not present

## 2021-04-22 DIAGNOSIS — I5189 Other ill-defined heart diseases: Secondary | ICD-10-CM | POA: Diagnosis not present

## 2021-04-22 DIAGNOSIS — I2692 Saddle embolus of pulmonary artery without acute cor pulmonale: Secondary | ICD-10-CM | POA: Diagnosis not present

## 2021-04-22 DIAGNOSIS — E039 Hypothyroidism, unspecified: Secondary | ICD-10-CM | POA: Diagnosis not present

## 2021-04-22 DIAGNOSIS — Z681 Body mass index (BMI) 19 or less, adult: Secondary | ICD-10-CM | POA: Diagnosis not present

## 2021-04-22 DIAGNOSIS — E119 Type 2 diabetes mellitus without complications: Secondary | ICD-10-CM | POA: Diagnosis not present

## 2021-04-22 DIAGNOSIS — K219 Gastro-esophageal reflux disease without esophagitis: Secondary | ICD-10-CM | POA: Diagnosis not present

## 2021-04-22 DIAGNOSIS — E441 Mild protein-calorie malnutrition: Secondary | ICD-10-CM | POA: Diagnosis not present

## 2021-04-22 DIAGNOSIS — K581 Irritable bowel syndrome with constipation: Secondary | ICD-10-CM | POA: Diagnosis not present

## 2021-04-22 DIAGNOSIS — I10 Essential (primary) hypertension: Secondary | ICD-10-CM | POA: Diagnosis not present

## 2021-04-22 DIAGNOSIS — Z96642 Presence of left artificial hip joint: Secondary | ICD-10-CM | POA: Diagnosis not present

## 2021-04-23 DIAGNOSIS — K581 Irritable bowel syndrome with constipation: Secondary | ICD-10-CM | POA: Diagnosis not present

## 2021-04-23 DIAGNOSIS — M80852D Other osteoporosis with current pathological fracture, left femur, subsequent encounter for fracture with routine healing: Secondary | ICD-10-CM | POA: Diagnosis not present

## 2021-04-23 DIAGNOSIS — Z96642 Presence of left artificial hip joint: Secondary | ICD-10-CM | POA: Diagnosis not present

## 2021-04-23 DIAGNOSIS — E119 Type 2 diabetes mellitus without complications: Secondary | ICD-10-CM | POA: Diagnosis not present

## 2021-04-23 DIAGNOSIS — Z7901 Long term (current) use of anticoagulants: Secondary | ICD-10-CM | POA: Diagnosis not present

## 2021-04-23 DIAGNOSIS — W19XXXD Unspecified fall, subsequent encounter: Secondary | ICD-10-CM | POA: Diagnosis not present

## 2021-04-23 DIAGNOSIS — I5189 Other ill-defined heart diseases: Secondary | ICD-10-CM | POA: Diagnosis not present

## 2021-04-23 DIAGNOSIS — E785 Hyperlipidemia, unspecified: Secondary | ICD-10-CM | POA: Diagnosis not present

## 2021-04-23 DIAGNOSIS — Z8673 Personal history of transient ischemic attack (TIA), and cerebral infarction without residual deficits: Secondary | ICD-10-CM | POA: Diagnosis not present

## 2021-04-23 DIAGNOSIS — I2692 Saddle embolus of pulmonary artery without acute cor pulmonale: Secondary | ICD-10-CM | POA: Diagnosis not present

## 2021-04-23 DIAGNOSIS — I10 Essential (primary) hypertension: Secondary | ICD-10-CM | POA: Diagnosis not present

## 2021-04-23 DIAGNOSIS — E441 Mild protein-calorie malnutrition: Secondary | ICD-10-CM | POA: Diagnosis not present

## 2021-04-23 DIAGNOSIS — H409 Unspecified glaucoma: Secondary | ICD-10-CM | POA: Diagnosis not present

## 2021-04-23 DIAGNOSIS — Z681 Body mass index (BMI) 19 or less, adult: Secondary | ICD-10-CM | POA: Diagnosis not present

## 2021-04-23 DIAGNOSIS — E039 Hypothyroidism, unspecified: Secondary | ICD-10-CM | POA: Diagnosis not present

## 2021-04-23 DIAGNOSIS — K219 Gastro-esophageal reflux disease without esophagitis: Secondary | ICD-10-CM | POA: Diagnosis not present

## 2021-04-24 DIAGNOSIS — I2692 Saddle embolus of pulmonary artery without acute cor pulmonale: Secondary | ICD-10-CM | POA: Diagnosis not present

## 2021-04-24 DIAGNOSIS — E119 Type 2 diabetes mellitus without complications: Secondary | ICD-10-CM | POA: Diagnosis not present

## 2021-04-24 DIAGNOSIS — K219 Gastro-esophageal reflux disease without esophagitis: Secondary | ICD-10-CM | POA: Diagnosis not present

## 2021-04-24 DIAGNOSIS — Z96642 Presence of left artificial hip joint: Secondary | ICD-10-CM | POA: Diagnosis not present

## 2021-04-24 DIAGNOSIS — E441 Mild protein-calorie malnutrition: Secondary | ICD-10-CM | POA: Diagnosis not present

## 2021-04-24 DIAGNOSIS — W19XXXD Unspecified fall, subsequent encounter: Secondary | ICD-10-CM | POA: Diagnosis not present

## 2021-04-24 DIAGNOSIS — Z8673 Personal history of transient ischemic attack (TIA), and cerebral infarction without residual deficits: Secondary | ICD-10-CM | POA: Diagnosis not present

## 2021-04-24 DIAGNOSIS — Z681 Body mass index (BMI) 19 or less, adult: Secondary | ICD-10-CM | POA: Diagnosis not present

## 2021-04-24 DIAGNOSIS — E039 Hypothyroidism, unspecified: Secondary | ICD-10-CM | POA: Diagnosis not present

## 2021-04-24 DIAGNOSIS — E785 Hyperlipidemia, unspecified: Secondary | ICD-10-CM | POA: Diagnosis not present

## 2021-04-24 DIAGNOSIS — K581 Irritable bowel syndrome with constipation: Secondary | ICD-10-CM | POA: Diagnosis not present

## 2021-04-24 DIAGNOSIS — M80852D Other osteoporosis with current pathological fracture, left femur, subsequent encounter for fracture with routine healing: Secondary | ICD-10-CM | POA: Diagnosis not present

## 2021-04-24 DIAGNOSIS — I10 Essential (primary) hypertension: Secondary | ICD-10-CM | POA: Diagnosis not present

## 2021-04-24 DIAGNOSIS — Z7901 Long term (current) use of anticoagulants: Secondary | ICD-10-CM | POA: Diagnosis not present

## 2021-04-24 DIAGNOSIS — H409 Unspecified glaucoma: Secondary | ICD-10-CM | POA: Diagnosis not present

## 2021-04-24 DIAGNOSIS — I5189 Other ill-defined heart diseases: Secondary | ICD-10-CM | POA: Diagnosis not present

## 2021-04-27 DIAGNOSIS — E039 Hypothyroidism, unspecified: Secondary | ICD-10-CM | POA: Diagnosis not present

## 2021-04-27 DIAGNOSIS — I25729 Atherosclerosis of autologous artery coronary artery bypass graft(s) with unspecified angina pectoris: Secondary | ICD-10-CM | POA: Diagnosis not present

## 2021-04-28 DIAGNOSIS — I5189 Other ill-defined heart diseases: Secondary | ICD-10-CM | POA: Diagnosis not present

## 2021-04-28 DIAGNOSIS — I2692 Saddle embolus of pulmonary artery without acute cor pulmonale: Secondary | ICD-10-CM | POA: Diagnosis not present

## 2021-04-28 DIAGNOSIS — E039 Hypothyroidism, unspecified: Secondary | ICD-10-CM | POA: Diagnosis not present

## 2021-04-28 DIAGNOSIS — K219 Gastro-esophageal reflux disease without esophagitis: Secondary | ICD-10-CM | POA: Diagnosis not present

## 2021-04-28 DIAGNOSIS — E441 Mild protein-calorie malnutrition: Secondary | ICD-10-CM | POA: Diagnosis not present

## 2021-04-28 DIAGNOSIS — Z7901 Long term (current) use of anticoagulants: Secondary | ICD-10-CM | POA: Diagnosis not present

## 2021-04-28 DIAGNOSIS — E119 Type 2 diabetes mellitus without complications: Secondary | ICD-10-CM | POA: Diagnosis not present

## 2021-04-28 DIAGNOSIS — W19XXXD Unspecified fall, subsequent encounter: Secondary | ICD-10-CM | POA: Diagnosis not present

## 2021-04-28 DIAGNOSIS — K581 Irritable bowel syndrome with constipation: Secondary | ICD-10-CM | POA: Diagnosis not present

## 2021-04-28 DIAGNOSIS — Z681 Body mass index (BMI) 19 or less, adult: Secondary | ICD-10-CM | POA: Diagnosis not present

## 2021-04-28 DIAGNOSIS — I10 Essential (primary) hypertension: Secondary | ICD-10-CM | POA: Diagnosis not present

## 2021-04-28 DIAGNOSIS — Z96642 Presence of left artificial hip joint: Secondary | ICD-10-CM | POA: Diagnosis not present

## 2021-04-28 DIAGNOSIS — M80852D Other osteoporosis with current pathological fracture, left femur, subsequent encounter for fracture with routine healing: Secondary | ICD-10-CM | POA: Diagnosis not present

## 2021-04-28 DIAGNOSIS — E785 Hyperlipidemia, unspecified: Secondary | ICD-10-CM | POA: Diagnosis not present

## 2021-04-28 DIAGNOSIS — H409 Unspecified glaucoma: Secondary | ICD-10-CM | POA: Diagnosis not present

## 2021-04-28 DIAGNOSIS — Z8673 Personal history of transient ischemic attack (TIA), and cerebral infarction without residual deficits: Secondary | ICD-10-CM | POA: Diagnosis not present

## 2021-04-30 DIAGNOSIS — R55 Syncope and collapse: Secondary | ICD-10-CM | POA: Diagnosis not present

## 2021-04-30 DIAGNOSIS — I25729 Atherosclerosis of autologous artery coronary artery bypass graft(s) with unspecified angina pectoris: Secondary | ICD-10-CM | POA: Diagnosis not present

## 2021-04-30 DIAGNOSIS — R7301 Impaired fasting glucose: Secondary | ICD-10-CM | POA: Diagnosis not present

## 2021-04-30 DIAGNOSIS — E039 Hypothyroidism, unspecified: Secondary | ICD-10-CM | POA: Diagnosis not present

## 2021-04-30 DIAGNOSIS — S72002A Fracture of unspecified part of neck of left femur, initial encounter for closed fracture: Secondary | ICD-10-CM | POA: Diagnosis not present

## 2021-04-30 DIAGNOSIS — N182 Chronic kidney disease, stage 2 (mild): Secondary | ICD-10-CM | POA: Diagnosis not present

## 2021-04-30 DIAGNOSIS — E0590 Thyrotoxicosis, unspecified without thyrotoxic crisis or storm: Secondary | ICD-10-CM | POA: Diagnosis not present

## 2021-04-30 DIAGNOSIS — I2699 Other pulmonary embolism without acute cor pulmonale: Secondary | ICD-10-CM | POA: Diagnosis not present

## 2021-04-30 DIAGNOSIS — E7800 Pure hypercholesterolemia, unspecified: Secondary | ICD-10-CM | POA: Diagnosis not present

## 2021-04-30 DIAGNOSIS — J9601 Acute respiratory failure with hypoxia: Secondary | ICD-10-CM | POA: Diagnosis not present

## 2021-04-30 DIAGNOSIS — K219 Gastro-esophageal reflux disease without esophagitis: Secondary | ICD-10-CM | POA: Diagnosis not present

## 2021-04-30 DIAGNOSIS — M810 Age-related osteoporosis without current pathological fracture: Secondary | ICD-10-CM | POA: Diagnosis not present

## 2021-06-15 DIAGNOSIS — M810 Age-related osteoporosis without current pathological fracture: Secondary | ICD-10-CM | POA: Diagnosis not present

## 2021-09-10 DIAGNOSIS — E0590 Thyrotoxicosis, unspecified without thyrotoxic crisis or storm: Secondary | ICD-10-CM | POA: Diagnosis not present

## 2021-09-10 DIAGNOSIS — E7800 Pure hypercholesterolemia, unspecified: Secondary | ICD-10-CM | POA: Diagnosis not present

## 2021-09-10 DIAGNOSIS — R7301 Impaired fasting glucose: Secondary | ICD-10-CM | POA: Diagnosis not present

## 2021-09-14 DIAGNOSIS — M810 Age-related osteoporosis without current pathological fracture: Secondary | ICD-10-CM | POA: Diagnosis not present

## 2021-09-14 DIAGNOSIS — R7301 Impaired fasting glucose: Secondary | ICD-10-CM | POA: Diagnosis not present

## 2021-09-14 DIAGNOSIS — K219 Gastro-esophageal reflux disease without esophagitis: Secondary | ICD-10-CM | POA: Diagnosis not present

## 2021-09-14 DIAGNOSIS — N182 Chronic kidney disease, stage 2 (mild): Secondary | ICD-10-CM | POA: Diagnosis not present

## 2021-09-14 DIAGNOSIS — S72002A Fracture of unspecified part of neck of left femur, initial encounter for closed fracture: Secondary | ICD-10-CM | POA: Diagnosis not present

## 2021-09-14 DIAGNOSIS — E039 Hypothyroidism, unspecified: Secondary | ICD-10-CM | POA: Diagnosis not present

## 2021-09-14 DIAGNOSIS — J9601 Acute respiratory failure with hypoxia: Secondary | ICD-10-CM | POA: Diagnosis not present

## 2021-09-14 DIAGNOSIS — R55 Syncope and collapse: Secondary | ICD-10-CM | POA: Diagnosis not present

## 2021-09-14 DIAGNOSIS — E7800 Pure hypercholesterolemia, unspecified: Secondary | ICD-10-CM | POA: Diagnosis not present

## 2021-09-14 DIAGNOSIS — I25729 Atherosclerosis of autologous artery coronary artery bypass graft(s) with unspecified angina pectoris: Secondary | ICD-10-CM | POA: Diagnosis not present

## 2021-09-14 DIAGNOSIS — I2699 Other pulmonary embolism without acute cor pulmonale: Secondary | ICD-10-CM | POA: Diagnosis not present

## 2021-10-13 DIAGNOSIS — R531 Weakness: Secondary | ICD-10-CM | POA: Diagnosis not present

## 2021-10-13 DIAGNOSIS — E7800 Pure hypercholesterolemia, unspecified: Secondary | ICD-10-CM | POA: Diagnosis not present

## 2021-10-13 DIAGNOSIS — R079 Chest pain, unspecified: Secondary | ICD-10-CM | POA: Diagnosis not present

## 2021-10-13 DIAGNOSIS — R252 Cramp and spasm: Secondary | ICD-10-CM | POA: Diagnosis not present

## 2021-11-18 ENCOUNTER — Other Ambulatory Visit: Payer: Self-pay | Admitting: *Deleted

## 2021-11-18 DIAGNOSIS — Z86711 Personal history of pulmonary embolism: Secondary | ICD-10-CM | POA: Diagnosis not present

## 2021-11-18 DIAGNOSIS — E039 Hypothyroidism, unspecified: Secondary | ICD-10-CM | POA: Diagnosis not present

## 2021-11-18 DIAGNOSIS — I480 Paroxysmal atrial fibrillation: Secondary | ICD-10-CM | POA: Diagnosis not present

## 2021-11-18 DIAGNOSIS — Z136 Encounter for screening for cardiovascular disorders: Secondary | ICD-10-CM | POA: Diagnosis not present

## 2021-11-18 NOTE — Patient Outreach (Signed)
  Care Coordination   11/18/2021  Name: Lori George MRN: [id number] DOB: [birth year]   Care Coordination Outreach Attempts:  An unsuccessful telephone outreach was attempted today to offer the patient information about available care coordination services as a benefit of their health plan. HIPAA compliant message left on voicemail, providing contact information for CSW, encouraging patient to return CSW's call at her earliest convenience.  Follow Up Plan:  Additional outreach attempts will be made to offer the patient care coordination information and services.   Encounter Outcome:  No Answer.   Care Coordination Interventions Activated:  No.    Care Coordination Interventions:  No, not indicated.    Nat Christen, BSW, MSW, LCSW  Licensed Education officer, environmental Health System  Mailing [place] [address], [place], [place] [address] Physical Address[address], [place], [place] [address] Toll Free Main # [phone] Fax # [phone] Cell # [phone] [email or web address]

## 2021-11-29 ENCOUNTER — Other Ambulatory Visit: Payer: Self-pay | Admitting: *Deleted

## 2021-11-29 NOTE — Patient Outreach (Signed)
  Care Coordination   11/29/2021  Name: Lori George MRN: [id number] DOB: 07-01-1932   Care Coordination Outreach Attempts:  A second unsuccessful outreach was attempted today to offer the patient with information about available care coordination services as a benefit of their health plan.   HIPAA compliant messages left on voicemail for patient, providing contact information for CSW, encouraging patient to return CSW's call at her earliest convenience.  Follow Up Plan:  Additional outreach attempts will be made to offer the patient care coordination information and services.   Encounter Outcome:  No Answer.   Care Coordination Interventions Activated:  No.    Care Coordination Interventions:  No, not indicated.    Nat Christen, BSW, MSW, LCSW  Licensed Education officer, environmental Health System  Mailing [place] [address] [place], [place] [address] Physical Address[address] [place], [place] [address] Toll Free Main # [phone] Fax # [phone] Cell # [phone] [email or web address]

## 2021-12-01 ENCOUNTER — Ambulatory Visit: Payer: Self-pay | Admitting: *Deleted

## 2021-12-01 ENCOUNTER — Encounter: Payer: Self-pay | Admitting: *Deleted

## 2021-12-01 NOTE — Patient Instructions (Signed)
Visit Information  Thank you for taking time to visit with me today. Please don't hesitate to contact me if I can be of assistance to you.   Please call the care guide team at [phone] if you need to cancel or reschedule your appointment.   If you are experiencing a Mental Health or Behavioral Health Crisis or need someone to talk to, please call the Suicide and Crisis Lifeline: 988 call the [place] National Suicide Prevention Lifeline: 1-800-273-8255 or TTY: 1-800-799-4 TTY (1-800-799-4889) to talk to a trained counselor call 1-800-273-TALK (toll free, 24 hour hotline) go to [facility] [address], [place] ([phone]) call the [place] Crisis Line: 800-939-9988 call 911  Patient verbalizes understanding of instructions and care plan provided today and agrees to view in MyChart. Active MyChart status and patient understanding of how to access instructions and care plan via MyChart confirmed with patient.     No further follow up required.  Dvante Hands, BSW, MSW, LCSW  Licensed Clinical Social Worker  [facility] [place] [facility]  Mailing Address-[address], [place], [place] [address] Physical Address-[address], [place], [place] [address] Toll Free Main # [phone] Fax # [phone] Cell # [phone] [email or web address]            

## 2021-12-01 NOTE — Patient Outreach (Signed)
  Care Coordination   Initial Visit Note   12/01/2021  Name: Lori George MRN: [id number] DOB: [birth year]  Lori George is a 88 y.o. year old female who sees Sinda Du, MD for primary care. I spoke with Lori George by phone today.  What matters to the patients health and wellness today?   No Interventions Identified.  SDOH assessments and interventions completed:  Yes.  SDOH Interventions Today    Flowsheet Row Most Recent Value  SDOH Interventions   Food Insecurity Interventions Intervention Not Indicated  Housing Interventions Intervention Not Indicated  Transportation Interventions Intervention Not Indicated  Utilities Interventions Intervention Not Indicated  Alcohol Usage Interventions Intervention Not Indicated (Score <7)  Financial Strain Interventions Intervention Not Indicated  Physical Activity Interventions Patient Refused  Stress Interventions Intervention Not Indicated  Social Connections Interventions Intervention Not Indicated     Care Coordination Interventions Activated:  Yes.   Care Coordination Interventions:  Yes, provided.   Follow up plan: No further intervention required.   Encounter Outcome:  Pt. Visit Completed.   Nat Christen, BSW, MSW, LCSW  Licensed Education officer, environmental Health System  Mailing [place] [address] [place], [place] [address] Physical Address-[address], [place], [place] [address] Toll Free George # [phone] Fax # [phone] Cell # [phone] [email or web address]

## 2021-12-29 DIAGNOSIS — H31012 Macula scars of posterior pole (postinflammatory) (post-traumatic), left eye: Secondary | ICD-10-CM | POA: Diagnosis not present

## 2021-12-29 DIAGNOSIS — H401113 Primary open-angle glaucoma, right eye, severe stage: Secondary | ICD-10-CM | POA: Diagnosis not present

## 2021-12-29 DIAGNOSIS — Z961 Presence of intraocular lens: Secondary | ICD-10-CM | POA: Diagnosis not present

## 2021-12-29 DIAGNOSIS — H401122 Primary open-angle glaucoma, left eye, moderate stage: Secondary | ICD-10-CM | POA: Diagnosis not present

## 2022-01-05 DIAGNOSIS — H938X2 Other specified disorders of left ear: Secondary | ICD-10-CM | POA: Diagnosis not present

## 2022-01-05 DIAGNOSIS — H6123 Impacted cerumen, bilateral: Secondary | ICD-10-CM | POA: Diagnosis not present

## 2022-01-11 DIAGNOSIS — M1990 Unspecified osteoarthritis, unspecified site: Secondary | ICD-10-CM | POA: Diagnosis not present

## 2022-01-11 DIAGNOSIS — H6092 Unspecified otitis externa, left ear: Secondary | ICD-10-CM | POA: Diagnosis not present

## 2022-01-11 DIAGNOSIS — Z79899 Other long term (current) drug therapy: Secondary | ICD-10-CM | POA: Diagnosis not present

## 2022-01-11 DIAGNOSIS — J310 Chronic rhinitis: Secondary | ICD-10-CM | POA: Diagnosis not present

## 2022-01-18 DIAGNOSIS — Z79899 Other long term (current) drug therapy: Secondary | ICD-10-CM | POA: Diagnosis not present

## 2022-01-18 DIAGNOSIS — M064 Inflammatory polyarthropathy: Secondary | ICD-10-CM | POA: Diagnosis not present

## 2022-01-18 DIAGNOSIS — Z713 Dietary counseling and surveillance: Secondary | ICD-10-CM | POA: Diagnosis not present

## 2022-01-18 DIAGNOSIS — Z2821 Immunization not carried out because of patient refusal: Secondary | ICD-10-CM | POA: Diagnosis not present

## 2022-01-18 DIAGNOSIS — M810 Age-related osteoporosis without current pathological fracture: Secondary | ICD-10-CM | POA: Diagnosis not present

## 2022-01-18 DIAGNOSIS — E559 Vitamin D deficiency, unspecified: Secondary | ICD-10-CM | POA: Diagnosis not present

## 2022-01-18 DIAGNOSIS — R5383 Other fatigue: Secondary | ICD-10-CM | POA: Diagnosis not present

## 2022-01-18 DIAGNOSIS — R7989 Other specified abnormal findings of blood chemistry: Secondary | ICD-10-CM | POA: Diagnosis not present

## 2022-01-26 DIAGNOSIS — H6092 Unspecified otitis externa, left ear: Secondary | ICD-10-CM | POA: Diagnosis not present

## 2022-01-26 DIAGNOSIS — J310 Chronic rhinitis: Secondary | ICD-10-CM | POA: Diagnosis not present

## 2022-02-08 DIAGNOSIS — M818 Other osteoporosis without current pathological fracture: Secondary | ICD-10-CM | POA: Diagnosis not present

## 2022-02-08 DIAGNOSIS — Z13820 Encounter for screening for osteoporosis: Secondary | ICD-10-CM | POA: Diagnosis not present

## 2022-02-16 DIAGNOSIS — H401113 Primary open-angle glaucoma, right eye, severe stage: Secondary | ICD-10-CM | POA: Diagnosis not present

## 2022-02-16 DIAGNOSIS — H401122 Primary open-angle glaucoma, left eye, moderate stage: Secondary | ICD-10-CM | POA: Diagnosis not present

## 2022-02-16 DIAGNOSIS — Z961 Presence of intraocular lens: Secondary | ICD-10-CM | POA: Diagnosis not present

## 2022-02-16 DIAGNOSIS — H31012 Macula scars of posterior pole (postinflammatory) (post-traumatic), left eye: Secondary | ICD-10-CM | POA: Diagnosis not present

## 2022-02-18 DIAGNOSIS — M810 Age-related osteoporosis without current pathological fracture: Secondary | ICD-10-CM | POA: Diagnosis not present

## 2022-03-01 ENCOUNTER — Ambulatory Visit
Admission: EM | Admit: 2022-03-01 | Discharge: 2022-03-01 | Disposition: A | Discharge status: Home / Self Care | Payer: Medicare HMO | Attending: Nurse Practitioner | Admitting: Nurse Practitioner

## 2022-03-01 ENCOUNTER — Encounter: Payer: Self-pay | Admitting: Emergency Medicine

## 2022-03-01 ENCOUNTER — Ambulatory Visit: Payer: Self-pay

## 2022-03-01 DIAGNOSIS — J069 Acute upper respiratory infection, unspecified: Secondary | ICD-10-CM | POA: Diagnosis not present

## 2022-03-01 DIAGNOSIS — Z1152 Encounter for screening for COVID-19: Secondary | ICD-10-CM | POA: Insufficient documentation

## 2022-03-01 MED ORDER — BENZONATATE 100 MG PO CAPS: 100.0000 mg | ORAL_CAPSULE | Freq: Three times a day (TID) | ORAL | 0 refills | Status: DC | PRN

## 2022-03-01 NOTE — ED Triage Notes (Signed)
Pt reports a cough, nasal congestion, sore throat, and fever. States symptoms started on Thursday and had a fever all day yesterday. Has been taking Mucinex and Tylenol.

## 2022-03-01 NOTE — Discharge Instructions (Signed)
You have a viral upper respiratory infection.  Symptoms should improve over the next week to 10 days.  If you develop chest pain or shortness of breath, go to the emergency room.  We have tested you today for COVID-19.  You will see the results in Mychart and we will call you with positive results.    Please stay home and isolate until you are aware of the results.    Some things that can make you feel better are: - Increased rest - Increasing fluid with water/sugar free electrolytes - Acetaminophen and ibuprofen as needed for fever/pain - Salt water gargling, chloraseptic spray and throat lozenges for sore throat - OTC guaifenesin (Mucinex) 600 mg twice daily - Saline sinus flushes or a neti pot - Humidifying the air -Tessalon Perles every 8 hours as needed for dry cough - Flonase for the runny nose/nasal congestion/sinus pressure

## 2022-03-01 NOTE — ED Provider Notes (Signed)
[facility]    CSN: [id number] Arrival date & time: 03/01/22  1149      History   Chief Complaint Chief Complaint  Patient presents with   Cough   Nasal Congestion   Sore Throat   Fever    HPI Lori George is a 89 y.o. female.   Patient presents with daughter for 4 days of low-grade fevers, congested cough and shortness of breath with chest pain after coughing.  Also with nasal congestion, runny nose, sore throat, nausea without vomiting, upper abdominal pain from coughing, diarrhea for the past few days, decreased appetite, and fatigue.  Patient denies wheezing, nasal congestion, headache, ear pain, known sick contacts.  Has taken cough syrup, Tylenol, and Mucinex for symptoms which have not helped much.  Has not performed COVID-19 testing at home.  Patient denies smoking history.  No history of chronic lung disease to the report.    Past Medical History:  Diagnosis Date   Glaucoma    Stroke [facility])    Thyroid disease     Patient Active Problem List   Diagnosis Date Noted   Acute postoperative anemia due to expected blood loss 03/12/2021   Thrombocytopenia ([place]) 03/12/2021   Osteoporosis with multiple compression vertebral fracture 03/12/2021   CAP (community acquired pneumonia) 03/12/2021   Closed displaced fracture of left femoral neck ([place]) 03/09/2021   Acute respiratory failure with hypoxia ([place]) 03/08/2021   Hyperlipidemia 05/17/2016   GERD (gastroesophageal reflux disease) 05/17/2016   TIA (transient ischemic attack) 05/15/2016   Low back pain 05/15/2016   Hypothyroidism 05/15/2016    Past Surgical History:  Procedure Laterality Date   BACK SURGERY     HIP ARTHROPLASTY Left 03/10/2021   Procedure: ARTHROPLASTY BIPOLAR HIP (HEMIARTHROPLASTY);  Surgeon: Renette Butters, MD;  Location: [facility];  Service: Orthopedics;  Laterality: Left;   IR FLUORO GUIDED NEEDLE PLC ASPIRATION/INJECTION LOC  06/17/2017   IR KYPHO EA ADDL LEVEL THORACIC OR LUMBAR   06/17/2017   IR KYPHO THORACIC WITH BONE BIOPSY  06/17/2017   IR RADIOLOGIST EVAL & MGMT  06/13/2017    OB History   No obstetric history on file.      Home Medications    Prior to Admission medications   Medication Sig Start Date End Date Taking? Authorizing Provider  benzonatate (TESSALON) 100 MG capsule Take 1 capsule (100 mg total) by mouth 3 (three) times daily as needed for cough. Do not take with alcohol or while driving or operating heavy machinery.  May cause drowsiness. 03/01/22  Yes Eulogio Bear, NP  acetaminophen (TYLENOL) 500 MG tablet Take 500-1,000 mg by mouth every 6 (six) hours as needed for mild pain or moderate pain.    [provider]  acetaminophen (TYLENOL) 500 MG tablet Take 1-2 tablets (500-1,000 mg total) by mouth every 6 (six) hours as needed for mild pain or moderate pain. 03/12/21   Britt Bottom, PA-C  aspirin EC 81 MG tablet Take 1 tablet (81 mg total) by mouth 2 (two) times daily. For DVT prophylaxis for 30 days after surgery. 03/12/21   Britt Bottom, PA-C  brimonidine (ALPHAGAN) 0.2 % ophthalmic solution Place 1 drop into both eyes 2 (two) times daily.  Patient not taking: Reported on 03/08/2021 05/30/14   [provider]  calcium carbonate (TUMS - DOSED IN MG ELEMENTAL CALCIUM) 500 MG chewable tablet Chew 1 tablet by mouth daily as needed for heartburn.     [provider]  CALCIUM  PO Take 1 tablet by mouth daily.    [provider]  Levothyroxine Sodium 75 MCG CAPS Take 75 mcg by mouth daily before breakfast.    [provider]  linaclotide (LINZESS) 72 MCG capsule Take 72 mcg by mouth daily before breakfast. Patient not taking: Reported on 03/08/2021    [provider]  Multiple Vitamins-Minerals (MULTIVITAMIN WITH MINERALS) tablet Take 1 tablet by mouth daily.    [provider]  pantoprazole (PROTONIX) 40 MG tablet Take 1 tablet by mouth daily. 06/06/17   [provider]  PROCTO-MED  HC 2.5 % rectal cream Place 1 application rectally 2 (two) times daily. Patient not taking: Reported on 03/08/2021 06/10/17   [provider]  timolol (TIMOPTIC) 0.5 % ophthalmic solution Place 1 drop into the right eye daily.  04/18/14   [provider]  traMADol (ULTRAM) 50 MG tablet Take 1 tablet (50 mg total) by mouth every 6 (six) hours as needed for severe pain. 03/12/21 03/12/22  Jenne Pane, PA-C  VITAMIN D, CHOLECALCIFEROL, PO Take 1 capsule by mouth daily.    [provider]    Family History History reviewed. No pertinent family history.  Social History Social History   Tobacco Use   Smoking status: Never    Passive exposure: Never   Smokeless tobacco: Never  Vaping Use   Vaping Use: Never used  Substance Use Topics   Alcohol use: No   Drug use: No     Allergies   Patient has no known allergies.   Review of Systems Review of Systems Per HPI  Physical Exam Triage Vital Signs ED Triage Vitals  Enc Vitals Group     BP 03/01/22 1227 (!) 147/91     Pulse Rate 03/01/22 1227 71     Resp 03/01/22 1227 18     Temp 03/01/22 1227 97.7 F (36.5 C)     Temp Source 03/01/22 1227 Oral     SpO2 03/01/22 1227 94 %     Weight --      Height --      Head Circumference --      Peak Flow --      Pain Score 03/01/22 1228 0     Pain Loc --      Pain Edu? --      Excl. in GC? --    No data found.  Updated Vital Signs BP (!) 147/91 (BP Location: Right Arm)   Pulse 71   Temp 97.7 F (36.5 C) (Oral)   Resp 18   SpO2 94%   Oxygen saturation recheck: 96% on room air  Visual Acuity Right Eye Distance:   Left Eye Distance:   Bilateral Distance:    Right Eye Near:   Left Eye Near:    Bilateral Near:     Physical Exam Vitals and nursing note reviewed.  Constitutional:      General: She is not in acute distress.    Appearance: Normal appearance. She is not ill-appearing or toxic-appearing.  HENT:     Head: Normocephalic and atraumatic.      Right Ear: Tympanic membrane, ear canal and external ear normal.     Left Ear: Tympanic membrane, ear canal and external ear normal.     Nose: Congestion and rhinorrhea present.     Mouth/Throat:     Mouth: Mucous membranes are moist.     Pharynx: Oropharynx is clear. No oropharyngeal exudate or posterior oropharyngeal erythema.  Eyes:  General: No scleral icterus.    Extraocular Movements: Extraocular movements intact.  Cardiovascular:     Rate and Rhythm: Normal rate and regular rhythm.  Pulmonary:     Effort: Pulmonary effort is normal. No respiratory distress.     Breath sounds: Normal breath sounds. No wheezing, rhonchi or rales.  Abdominal:     General: Abdomen is flat. Bowel sounds are normal. There is no distension.     Palpations: Abdomen is soft.     Tenderness: There is no abdominal tenderness. There is no guarding.  Musculoskeletal:     Cervical back: Normal range of motion and neck supple.  Lymphadenopathy:     Cervical: No cervical adenopathy.  Skin:    General: Skin is warm and dry.     Coloration: Skin is not jaundiced or pale.     Findings: No erythema or rash.  Neurological:     Mental Status: She is alert and oriented to person, place, and time.     Deep Tendon Reflexes: Reflexes normal.  Psychiatric:        Behavior: Behavior is cooperative.      UC Treatments / Results  Labs (all labs ordered are listed, but only abnormal results are displayed) Labs Reviewed  SARS CORONAVIRUS 2 (TAT 6-24 HRS)    EKG   Radiology No results found.  Procedures Procedures (including critical care time)  Medications Ordered in UC Medications - No data to display  Initial Impression / Assessment and Plan / UC Course  I have reviewed the triage vital signs and the nursing notes.  Pertinent labs & imaging results that were available during my care of the patient were reviewed by me and considered in my medical decision making (see chart for details).    Patient is well-appearing, normotensive, afebrile, not tachycardic, not tachypneic, oxygenating well on room air.    Viral URI with cough Encounter for screening for COVID-19 Suspect viral etiology COVID-19 testing performed, patient is a candidate for molnupiravir if she test positive Unable to perform influenza testing secondary to national shortage of testing supplies Supportive care discussed Start cough suppressant ER and return precautions discussed with patient and daughter  The patient was given the opportunity to ask questions.  All questions answered to their satisfaction.  The patient is in agreement to this plan.    Final Clinical Impressions(s) / UC Diagnoses   Final diagnoses:  Viral URI with cough  Encounter for screening for COVID-19     Discharge Instructions      You have a viral upper respiratory infection.  Symptoms should improve over the next week to 10 days.  If you develop chest pain or shortness of breath, go to the emergency room.  We have tested you today for COVID-19.  You will see the results in Mychart and we will call you with positive results.    Please stay home and isolate until you are aware of the results.    Some things that can make you feel better are: - Increased rest - Increasing fluid with water/sugar free electrolytes - Acetaminophen and ibuprofen as needed for fever/pain - Salt water gargling, chloraseptic spray and throat lozenges for sore throat - OTC guaifenesin (Mucinex) 600 mg twice daily - Saline sinus flushes or a neti pot - Humidifying the air -Tessalon Perles every 8 hours as needed for dry cough - Flonase for the runny nose/nasal congestion/sinus pressure     ED Prescriptions     Medication Sig Dispense Auth.  Provider   benzonatate (TESSALON) 100 MG capsule Take 1 capsule (100 mg total) by mouth 3 (three) times daily as needed for cough. Do not take with alcohol or while driving or operating heavy machinery.  May cause  drowsiness. 21 capsule Eulogio Bear, NP      PDMP not reviewed this encounter.   Eulogio Bear, NP 03/01/22 1259

## 2022-03-02 LAB — SARS CORONAVIRUS 2 (TAT 6-24 HRS): SARS Coronavirus 2: NEGATIVE

## 2022-03-17 DIAGNOSIS — M810 Age-related osteoporosis without current pathological fracture: Secondary | ICD-10-CM | POA: Diagnosis not present

## 2022-03-17 DIAGNOSIS — N182 Chronic kidney disease, stage 2 (mild): Secondary | ICD-10-CM | POA: Diagnosis not present

## 2022-03-17 DIAGNOSIS — K5900 Constipation, unspecified: Secondary | ICD-10-CM | POA: Diagnosis not present

## 2022-03-17 DIAGNOSIS — E7800 Pure hypercholesterolemia, unspecified: Secondary | ICD-10-CM | POA: Diagnosis not present

## 2022-03-17 DIAGNOSIS — K219 Gastro-esophageal reflux disease without esophagitis: Secondary | ICD-10-CM | POA: Diagnosis not present

## 2022-03-17 DIAGNOSIS — Z0001 Encounter for general adult medical examination with abnormal findings: Secondary | ICD-10-CM | POA: Diagnosis not present

## 2022-03-17 DIAGNOSIS — I2510 Atherosclerotic heart disease of native coronary artery without angina pectoris: Secondary | ICD-10-CM | POA: Diagnosis not present

## 2022-03-17 DIAGNOSIS — E039 Hypothyroidism, unspecified: Secondary | ICD-10-CM | POA: Diagnosis not present

## 2022-03-17 DIAGNOSIS — I25729 Atherosclerosis of autologous artery coronary artery bypass graft(s) with unspecified angina pectoris: Secondary | ICD-10-CM | POA: Diagnosis not present

## 2022-03-28 ENCOUNTER — Emergency Department (HOSPITAL_COMMUNITY)
Admission: EM | Admit: 2022-03-28 | Discharge: 2022-03-28 | Disposition: A | Discharge status: Home / Self Care | Payer: Medicare HMO | Attending: Emergency Medicine | Admitting: Emergency Medicine

## 2022-03-28 ENCOUNTER — Other Ambulatory Visit: Payer: Self-pay

## 2022-03-28 ENCOUNTER — Encounter (HOSPITAL_COMMUNITY): Payer: Self-pay | Admitting: Emergency Medicine

## 2022-03-28 ENCOUNTER — Emergency Department (HOSPITAL_COMMUNITY): Payer: Medicare HMO

## 2022-03-28 DIAGNOSIS — Z7901 Long term (current) use of anticoagulants: Secondary | ICD-10-CM | POA: Diagnosis not present

## 2022-03-28 DIAGNOSIS — R42 Dizziness and giddiness: Secondary | ICD-10-CM | POA: Diagnosis not present

## 2022-03-28 DIAGNOSIS — R519 Headache, unspecified: Secondary | ICD-10-CM | POA: Diagnosis present

## 2022-03-28 DIAGNOSIS — S060X0A Concussion without loss of consciousness, initial encounter: Secondary | ICD-10-CM | POA: Insufficient documentation

## 2022-03-28 DIAGNOSIS — W19XXXA Unspecified fall, initial encounter: Secondary | ICD-10-CM

## 2022-03-28 DIAGNOSIS — E039 Hypothyroidism, unspecified: Secondary | ICD-10-CM | POA: Insufficient documentation

## 2022-03-28 DIAGNOSIS — W1830XA Fall on same level, unspecified, initial encounter: Secondary | ICD-10-CM | POA: Diagnosis not present

## 2022-03-28 DIAGNOSIS — R059 Cough, unspecified: Secondary | ICD-10-CM | POA: Diagnosis not present

## 2022-03-28 DIAGNOSIS — S199XXA Unspecified injury of neck, initial encounter: Secondary | ICD-10-CM | POA: Diagnosis not present

## 2022-03-28 HISTORY — DX: Other pulmonary embolism without acute cor pulmonale: I26.99

## 2022-03-28 LAB — CBC WITH DIFFERENTIAL/PLATELET
Abs Immature Granulocytes: 0.01 10*3/uL (ref 0.00–0.07)
Basophils Absolute: 0 10*3/uL (ref 0.0–0.1)
Basophils Relative: 1 %
Eosinophils Absolute: 0 10*3/uL (ref 0.0–0.5)
Eosinophils Relative: 0 %
HCT: 41.7 % (ref 36.0–46.0)
Hemoglobin: 14.1 g/dL (ref 12.0–15.0)
Immature Granulocytes: 0 %
Lymphocytes Relative: 26 %
Lymphs Abs: 1.3 10*3/uL (ref 0.7–4.0)
MCH: 32.3 pg (ref 26.0–34.0)
MCHC: 33.8 g/dL (ref 30.0–36.0)
MCV: 95.4 fL (ref 80.0–100.0)
Monocytes Absolute: 0.4 10*3/uL (ref 0.1–1.0)
Monocytes Relative: 9 %
Neutro Abs: 3.2 10*3/uL (ref 1.7–7.7)
Neutrophils Relative %: 64 %
Platelets: 207 10*3/uL (ref 150–400)
RBC: 4.37 MIL/uL (ref 3.87–5.11)
RDW: 12.8 % (ref 11.5–15.5)
WBC: 5 10*3/uL (ref 4.0–10.5)
nRBC: 0 % (ref 0.0–0.2)

## 2022-03-28 LAB — URINALYSIS, ROUTINE W REFLEX MICROSCOPIC
Bacteria, UA: NONE SEEN
Bilirubin Urine: NEGATIVE
Glucose, UA: NEGATIVE mg/dL
Ketones, ur: 5 mg/dL — AB
Leukocytes,Ua: NEGATIVE
Nitrite: NEGATIVE
Protein, ur: NEGATIVE mg/dL
Specific Gravity, Urine: 1.008 (ref 1.005–1.030)
pH: 7 (ref 5.0–8.0)

## 2022-03-28 LAB — BASIC METABOLIC PANEL
Anion gap: 9 (ref 5–15)
BUN: 24 mg/dL — ABNORMAL HIGH (ref 8–23)
CO2: 26 mmol/L (ref 22–32)
Calcium: 9.2 mg/dL (ref 8.9–10.3)
Chloride: 103 mmol/L (ref 98–111)
Creatinine, Ser: 0.86 mg/dL (ref 0.44–1.00)
GFR, Estimated: >60 mL/min (ref >60)
Glucose, Bld: 113 mg/dL — ABNORMAL HIGH (ref 70–99)
Potassium: 4.1 mmol/L (ref 3.5–5.1)
Sodium: 138 mmol/L (ref 135–145)

## 2022-03-28 MED ORDER — ONDANSETRON HCL 4 MG/2ML IJ SOLN
4.0000 mg | Freq: Once | INTRAMUSCULAR | Status: AC
  Administered 2022-03-28: 4 mg via INTRAVENOUS
  Filled 2022-03-28: qty 2

## 2022-03-28 MED ORDER — ONDANSETRON 4 MG PO TBDP: 4.0000 mg | ORAL_TABLET | Freq: Three times a day (TID) | ORAL | 0 refills | Status: AC | PRN

## 2022-03-28 NOTE — Discharge Instructions (Signed)
Your testing shows no signs of bleeding on the brain, no fractures of the spine, your blood work and urine sample were all unremarkable.  You can follow-up with your doctor this week, because of the minor head injury you may have ongoing headaches nausea vomiting lightheadedness or dizziness or excessive sleepiness.  Please make sure that a family member is with you at your house for the next couple of days in case you need some extra assistance.  Zofran as needed for nausea or vomiting 1 tablet every 6 hours.  Thank you for allowing [place] to treat you in the emergency department today.  After reviewing your examination and potential testing that was done it appears that you are safe to go home.  I would like for you to follow-up with your doctor within the next several days, have them obtain your results and follow-up with them to review all of these tests.  If you should develop severe or worsening symptoms return to the emergency department immediately

## 2022-03-28 NOTE — ED Provider Notes (Signed)
[facility] Provider Note   CSN: [id number] Arrival date & time: 03/28/22  1223     History  Chief Complaint  Patient presents with   Fall    Lori George is a 89 y.o. female.   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  She has a history of hyperlipidemia.  It is not clear where the pulmonary embolism was diagnosed but she currently takes an anticoagulant.  She states that she was getting ready for church and that she was getting ready to go out the door she fell backwards striking her head, she had some pain in the left side of her head and complains of neck pain.  She has had multiple compression fractures of her spine in the past and is complaining of neck pain which is somewhat chronic.  She denies chest pain shortness of breath abdominal pain or pain in the arms or the legs except for her upper arm.  She is unsure if she lost consciousness.  There is a granddaughter at the bedside who states that she is her normal self right now.  The patient states that she feels dizzy after striking her head but does not recall any prodromal symptoms that caused her fall.  She is unsure if she tripped or lost her balance or what happened.  She states it was a normal morning for her    Home Medications Prior to Admission medications   Medication Sig Start Date End Date Taking? Authorizing Provider  acetaminophen (TYLENOL) 500 MG tablet Take 500-1,000 mg by mouth every 6 (six) hours as needed for mild pain or moderate pain.   Yes [provider]  ondansetron (ZOFRAN-ODT) 4 MG disintegrating tablet Take 1 tablet (4 mg total) by mouth every 8 (eight) hours as needed for nausea. 03/28/22  Yes Noemi Chapel, MD  aspirin EC 81 MG  tablet Take 1 tablet (81 mg total) by mouth 2 (two) times daily. For DVT prophylaxis for 30 days after surgery. 03/12/21   Britt Bottom, PA-C  benzonatate (TESSALON) 100 MG capsule Take 1 capsule (100 mg total) by mouth 3 (three) times daily as needed for cough. Do not take with alcohol or while driving or operating heavy machinery.  May cause drowsiness. 03/01/22   Eulogio Bear, NP  brimonidine (ALPHAGAN) 0.2 % ophthalmic solution Place 1 drop into both eyes 2 (two) times daily.  Patient not taking: Reported on 03/08/2021 05/30/14   [provider]  calcium carbonate (TUMS - DOSED IN MG ELEMENTAL CALCIUM) 500 MG chewable tablet Chew 1 tablet by mouth daily as needed for heartburn.     [provider]  CALCIUM PO Take 1 tablet by mouth daily.    [provider]  Levothyroxine Sodium 75 MCG CAPS Take 75 mcg by mouth daily before breakfast.    [provider]  linaclotide (LINZESS) 72 MCG capsule Take 72 mcg by mouth daily before breakfast. Patient not taking: Reported on 03/08/2021    [provider]  Multiple Vitamins-Minerals (MULTIVITAMIN WITH MINERALS) tablet Take 1 tablet by mouth daily.    [provider]  pantoprazole (PROTONIX) 40 MG tablet Take 1 tablet by mouth daily. 06/06/17   [provider]  PROCTO-MED [facility]  2.5 % rectal cream Place 1 application rectally 2 (two) times daily. Patient not taking: Reported on 03/08/2021 06/10/17   [provider]  timolol (TIMOPTIC) 0.5 % ophthalmic solution Place 1 drop into the right eye daily.  04/18/14   [provider]  VITAMIN D, CHOLECALCIFEROL, PO Take 1 capsule by mouth daily.    [provider]      Allergies    Patient has no known allergies.    Review of Systems   Review of Systems  All other systems reviewed and are negative.   Physical Exam Updated Vital Signs BP 126/66   Pulse 78   Temp 97.9 F (36.6 C) (Oral)   Resp (!) 25   Ht 1.626 m (5\' 4" )    Wt 52.2 kg   SpO2 96%   BMI 19.74 kg/m  Physical Exam Vitals and nursing note reviewed.  Constitutional:      General: She is not in acute distress.    Appearance: She is well-developed.  HENT:     Head: Normocephalic.     Comments: Hematoma to the left parieto-occipital area of the scalp    Mouth/Throat:     Pharynx: No oropharyngeal exudate.  Eyes:     General: No scleral icterus.       Right eye: No discharge.        Left eye: No discharge.     Conjunctiva/sclera: Conjunctivae normal.     Pupils: Pupils are equal, round, and reactive to light.  Neck:     Thyroid: No thyromegaly.     Vascular: No JVD.     Comments: Mild tenderness over cervical spine, no thoracic or lumbar spinal tenderness, no rib tenderness Cardiovascular:     Rate and Rhythm: Normal rate and regular rhythm.     Heart sounds: Normal heart sounds. No murmur heard.    No friction rub. No gallop.  Pulmonary:     Effort: Pulmonary effort is normal. No respiratory distress.     Breath sounds: Normal breath sounds. No wheezing or rales.  Abdominal:     General: Bowel sounds are normal. There is no distension.     Palpations: Abdomen is soft. There is no mass.     Tenderness: There is no abdominal tenderness.  Musculoskeletal:        General: No tenderness. Normal range of motion.     Right lower leg: No edema.     Left lower leg: No edema.     Comments: Good range of motion of the hips and knees bilaterally, no obvious deformities, no leg length discrepancies.  Lymphadenopathy:     Cervical: No cervical adenopathy.  Skin:    General: Skin is warm and dry.     Findings: No erythema or rash.  Neurological:     Mental Status: She is alert.     Coordination: Coordination normal.     Comments: Awake alert and able to follow commands.  She has an anisocoria but has had prior eye surgery on the right  Psychiatric:        Behavior: Behavior normal.     ED Results / Procedures / Treatments   Labs (all  labs ordered are listed, but only abnormal results are displayed) Labs Reviewed  URINALYSIS, ROUTINE W REFLEX MICROSCOPIC - Abnormal; Notable for the following components:      Result Value   Hgb urine dipstick SMALL (*)    Ketones, ur 5 (*)    All other components within  normal limits  BASIC METABOLIC PANEL - Abnormal; Notable for the following components:   Glucose, Bld 113 (*)    BUN 24 (*)    All other components within normal limits  CBC WITH DIFFERENTIAL/PLATELET    EKG EKG Interpretation  Date/Time:  Sunday March 28 2022 12:31:31 EST Ventricular Rate:  85 PR Interval:  153 QRS Duration: 84 QT Interval:  376 QTC Calculation: 448 R Axis:   58 Text Interpretation: Sinus rhythm Multiple premature complexes, vent & supraven Confirmed by Eber Hong ([id number]) on 03/28/2022 1:11:58 PM  Radiology CT Cervical Spine Wo Contrast  Result Date: 03/28/2022 CLINICAL DATA:  Patient fell and struck head. EXAM: CT CERVICAL SPINE WITHOUT CONTRAST TECHNIQUE: Multidetector CT imaging of the cervical spine was performed without intravenous contrast. Multiplanar CT image reconstructions were also generated. RADIATION DOSE REDUCTION: This exam was performed according to the departmental dose-optimization program which includes automated exposure control, adjustment of the mA and/or kV according to patient size and/or use of iterative reconstruction technique. COMPARISON:  Cervical spine x-rays 06/29/2013 FINDINGS: Alignment: Normal. Skull base and vertebrae: No acute fracture. No primary bone lesion or focal pathologic process. Soft tissues and spinal canal: No prevertebral fluid or swelling. No visible canal hematoma. Disc levels: Intervertebral disc height well preserved throughout. The facets are well aligned bilaterally. Upper chest: Unremarkable. Other: None IMPRESSION: No acute fracture or listhesis of the cervical spine. Electronically Signed   By: Kennith Center M.D.   On: 03/28/2022 13:30   CT  Head Wo Contrast  Result Date: 03/28/2022 CLINICAL DATA:  Fall with injury to the top of the head.  Dizziness. EXAM: CT HEAD WITHOUT CONTRAST TECHNIQUE: Contiguous axial images were obtained from the base of the skull through the vertex without intravenous contrast. RADIATION DOSE REDUCTION: This exam was performed according to the departmental dose-optimization program which includes automated exposure control, adjustment of the mA and/or kV according to patient size and/or use of iterative reconstruction technique. COMPARISON:  03/08/2021 FINDINGS: Brain: There is no evidence for acute hemorrhage, hydrocephalus, mass lesion, or abnormal extra-axial fluid collection. No definite CT evidence for acute infarction. Vascular: No hyperdense vessel or unexpected calcification. Skull: No evidence for fracture. No worrisome lytic or sclerotic lesion. Sinuses/Orbits: Chronic mucosal disease noted both maxillary sinuses, right sphenoid sinus, and ethmoid air cells bilaterally. Frontal sinuses and mastoid air cells are clear bilaterally. Visualized portions of the globes and intraorbital fat are unremarkable. Other: None. IMPRESSION: 1. No acute intracranial abnormality. 2. Chronic paranasal sinus disease. Electronically Signed   By: Kennith Center M.D.   On: 03/28/2022 13:28    Procedures Procedures    Medications Ordered in ED Medications  ondansetron (ZOFRAN) injection 4 mg (4 mg Intravenous Given 03/28/22 1327)    ED Course/ Medical Decision Making/ A&P Clinical Course as of 03/28/22 1436  Sun Mar 28, 2022  1346 Repeat evaluation the patient having some intermittent nausea, likely secondary to concussion.  I have reviewed the CT scans and viewed them myself, no signs of fracture or dislocation of the spine and the intracranial brain appears normal without injury bruising bleeding shift or edema.  No skull fractures. [BM]  1346 The patient remains in normal sinus rhythm with occasional PACs, metabolic panel  is unremarkable, no renal dysfunction or electrolyte abnormalities of concern, CBC is normal [BM]    Clinical Course User Index [BM] Eber Hong, MD  Medical Decision Making Amount and/or Complexity of Data Reviewed Labs: ordered. Radiology: ordered. ECG/medicine tests: ordered.  Risk Prescription drug management.   Unclear about why the patient fell, will check CT head and cervical spine, some basic labs and a urine.  Patient agreeable, she will be placed on a cardiac monitor.  This patient presents to the ED for concern of fall with head injury, this involves an extensive number of treatment options, and is a complaint that carries with it a high risk of complications and morbidity.  The differential diagnosis includes intracranial hemorrhage, fracture of the spine, could have been a predisposition because of anemia, renal failure, arrhythmia, infection   Co morbidities that complicate the patient evaluation  Prior pulmonary embolism on Eliquis   Additional history obtained:  Additional history obtained from family members at the bedside External records from outside source obtained and reviewed including prior medical records which showed that the patient had a pulmonary embolism in the past   Lab Tests:  I Ordered, and personally interpreted labs.  The pertinent results include: CBC metabolic panel and urinalysis unremarkable   Imaging Studies ordered:  I ordered imaging studies including of the head cervical spine I independently visualized and interpreted imaging which showed no acute findings other than some arthritis in the neck I agree with the radiologist interpretation   Cardiac Monitoring: / EKG:  The patient was maintained on a cardiac monitor.  I personally viewed and interpreted the cardiac monitored which showed an underlying rhythm of: Normal sinus rhythm, occasional PACs   Problem List / ED Course / Critical  interventions / Medication management  The patient had a small amount of nausea while in the emergency department, she did have some excessive amounts of phlegm that seem to be in the back of her throat but no other signs of acute bacterial infection.  She improved with Zofran Members at the bedside agreed to watch the patient for the next 24 or more hours as needed.  They seem to be very responsible in a very close knit family including a granddaughter who is a nurse here at the hospital who expressed her understanding of the plan with exactness. I ordered medication including Zofran for nausea Reevaluation of the patient after these medicines showed that the patient improved I have reviewed the patients home medicines and have made adjustments as needed   Social Determinants of Health:  Elderly, lives by herself   Test / Admission - Considered:  Considered admission but the patient improved significantly and had pathology ruled out.         Final Clinical Impression(s) / ED Diagnoses Final diagnoses:  Concussion without loss of consciousness, initial encounter  Fall, initial encounter    Rx / DC Orders ED Discharge Orders          Ordered    ondansetron (ZOFRAN-ODT) 4 MG disintegrating tablet  Every 8 hours PRN        03/28/22 1435              Noemi Chapel, MD 03/28/22 1437

## 2022-03-28 NOTE — ED Notes (Signed)
Patient to CT at this time

## 2022-03-28 NOTE — ED Triage Notes (Signed)
Pt brought in by [facility] from home with c/o fall this morning. Pt reports her ears feel like they are "in a barrel". Denies LOC upon fall. Pt started c/o dizziness that started last night. Pt is on Eliquis. Pt hit the top of her head on the fireplace. Pt also c/o slight tenderness to upper left arm/shoulder. CBG 142, temp 98.1, HR 78, BP 150/78 per EMS.

## 2022-05-18 DIAGNOSIS — R7989 Other specified abnormal findings of blood chemistry: Secondary | ICD-10-CM | POA: Diagnosis not present

## 2022-05-18 DIAGNOSIS — M064 Inflammatory polyarthropathy: Secondary | ICD-10-CM | POA: Diagnosis not present

## 2022-05-18 DIAGNOSIS — Z2821 Immunization not carried out because of patient refusal: Secondary | ICD-10-CM | POA: Diagnosis not present

## 2022-05-18 DIAGNOSIS — Z713 Dietary counseling and surveillance: Secondary | ICD-10-CM | POA: Diagnosis not present

## 2022-05-18 DIAGNOSIS — Z79899 Other long term (current) drug therapy: Secondary | ICD-10-CM | POA: Diagnosis not present

## 2022-05-18 DIAGNOSIS — M810 Age-related osteoporosis without current pathological fracture: Secondary | ICD-10-CM | POA: Diagnosis not present

## 2022-05-18 DIAGNOSIS — E559 Vitamin D deficiency, unspecified: Secondary | ICD-10-CM | POA: Diagnosis not present

## 2022-05-18 DIAGNOSIS — R5383 Other fatigue: Secondary | ICD-10-CM | POA: Diagnosis not present

## 2022-06-21 DIAGNOSIS — H31012 Macula scars of posterior pole (postinflammatory) (post-traumatic), left eye: Secondary | ICD-10-CM | POA: Diagnosis not present

## 2022-06-21 DIAGNOSIS — H401113 Primary open-angle glaucoma, right eye, severe stage: Secondary | ICD-10-CM | POA: Diagnosis not present

## 2022-06-21 DIAGNOSIS — H401122 Primary open-angle glaucoma, left eye, moderate stage: Secondary | ICD-10-CM | POA: Diagnosis not present

## 2022-06-21 DIAGNOSIS — Z961 Presence of intraocular lens: Secondary | ICD-10-CM | POA: Diagnosis not present

## 2022-07-01 DIAGNOSIS — Z86711 Personal history of pulmonary embolism: Secondary | ICD-10-CM | POA: Diagnosis not present

## 2022-07-01 DIAGNOSIS — I480 Paroxysmal atrial fibrillation: Secondary | ICD-10-CM | POA: Diagnosis not present

## 2022-07-01 DIAGNOSIS — Z136 Encounter for screening for cardiovascular disorders: Secondary | ICD-10-CM | POA: Diagnosis not present

## 2022-07-01 DIAGNOSIS — E039 Hypothyroidism, unspecified: Secondary | ICD-10-CM | POA: Diagnosis not present

## 2022-08-11 DIAGNOSIS — R7989 Other specified abnormal findings of blood chemistry: Secondary | ICD-10-CM | POA: Diagnosis not present

## 2022-08-11 DIAGNOSIS — M1990 Unspecified osteoarthritis, unspecified site: Secondary | ICD-10-CM | POA: Diagnosis not present

## 2022-08-11 DIAGNOSIS — R636 Underweight: Secondary | ICD-10-CM | POA: Diagnosis not present

## 2022-08-11 DIAGNOSIS — Z2821 Immunization not carried out because of patient refusal: Secondary | ICD-10-CM | POA: Diagnosis not present

## 2022-08-11 DIAGNOSIS — R5383 Other fatigue: Secondary | ICD-10-CM | POA: Diagnosis not present

## 2022-08-11 DIAGNOSIS — M810 Age-related osteoporosis without current pathological fracture: Secondary | ICD-10-CM | POA: Diagnosis not present

## 2022-08-11 DIAGNOSIS — E559 Vitamin D deficiency, unspecified: Secondary | ICD-10-CM | POA: Diagnosis not present

## 2022-08-11 DIAGNOSIS — Z713 Dietary counseling and surveillance: Secondary | ICD-10-CM | POA: Diagnosis not present

## 2022-08-11 DIAGNOSIS — Z79899 Other long term (current) drug therapy: Secondary | ICD-10-CM | POA: Diagnosis not present

## 2022-08-23 DIAGNOSIS — M810 Age-related osteoporosis without current pathological fracture: Secondary | ICD-10-CM | POA: Diagnosis not present

## 2022-09-09 DIAGNOSIS — E039 Hypothyroidism, unspecified: Secondary | ICD-10-CM | POA: Diagnosis not present

## 2022-09-09 DIAGNOSIS — R7303 Prediabetes: Secondary | ICD-10-CM | POA: Diagnosis not present

## 2022-09-09 DIAGNOSIS — I25729 Atherosclerosis of autologous artery coronary artery bypass graft(s) with unspecified angina pectoris: Secondary | ICD-10-CM | POA: Diagnosis not present

## 2022-09-13 ENCOUNTER — Emergency Department (HOSPITAL_COMMUNITY): Payer: Medicare HMO

## 2022-09-13 ENCOUNTER — Other Ambulatory Visit: Payer: Self-pay

## 2022-09-13 ENCOUNTER — Encounter (HOSPITAL_COMMUNITY): Payer: Self-pay | Admitting: Emergency Medicine

## 2022-09-13 ENCOUNTER — Emergency Department (HOSPITAL_COMMUNITY)
Admission: EM | Admit: 2022-09-13 | Discharge: 2022-09-13 | Disposition: A | Discharge status: Home / Self Care | Payer: Medicare HMO | Attending: Emergency Medicine | Admitting: Emergency Medicine

## 2022-09-13 DIAGNOSIS — N2882 Megaloureter: Secondary | ICD-10-CM | POA: Diagnosis not present

## 2022-09-13 DIAGNOSIS — Z7982 Long term (current) use of aspirin: Secondary | ICD-10-CM | POA: Insufficient documentation

## 2022-09-13 DIAGNOSIS — Z7901 Long term (current) use of anticoagulants: Secondary | ICD-10-CM | POA: Insufficient documentation

## 2022-09-13 DIAGNOSIS — N3001 Acute cystitis with hematuria: Secondary | ICD-10-CM

## 2022-09-13 DIAGNOSIS — K5750 Diverticulosis of both small and large intestine without perforation or abscess without bleeding: Secondary | ICD-10-CM | POA: Diagnosis not present

## 2022-09-13 DIAGNOSIS — R319 Hematuria, unspecified: Secondary | ICD-10-CM | POA: Diagnosis present

## 2022-09-13 DIAGNOSIS — N200 Calculus of kidney: Secondary | ICD-10-CM | POA: Diagnosis not present

## 2022-09-13 LAB — CBC WITH DIFFERENTIAL/PLATELET
Abs Immature Granulocytes: 0.03 10*3/uL (ref 0.00–0.07)
Basophils Absolute: 0.1 10*3/uL (ref 0.0–0.1)
Basophils Relative: 1 %
Eosinophils Absolute: 0 10*3/uL (ref 0.0–0.5)
Eosinophils Relative: 0 %
HCT: 39.5 % (ref 36.0–46.0)
Hemoglobin: 13.2 g/dL (ref 12.0–15.0)
Immature Granulocytes: 0 %
Lymphocytes Relative: 10 %
Lymphs Abs: 0.9 10*3/uL (ref 0.7–4.0)
MCH: 32.6 pg (ref 26.0–34.0)
MCHC: 33.4 g/dL (ref 30.0–36.0)
MCV: 97.5 fL (ref 80.0–100.0)
Monocytes Absolute: 0.7 10*3/uL (ref 0.1–1.0)
Monocytes Relative: 8 %
Neutro Abs: 7.3 10*3/uL (ref 1.7–7.7)
Neutrophils Relative %: 81 %
Platelets: 186 10*3/uL (ref 150–400)
RBC: 4.05 MIL/uL (ref 3.87–5.11)
RDW: 12.9 % (ref 11.5–15.5)
WBC: 9 10*3/uL (ref 4.0–10.5)
nRBC: 0 % (ref 0.0–0.2)

## 2022-09-13 LAB — URINALYSIS, ROUTINE W REFLEX MICROSCOPIC
Bacteria, UA: NONE SEEN
Bilirubin Urine: NEGATIVE
Glucose, UA: NEGATIVE mg/dL
Ketones, ur: NEGATIVE mg/dL
Nitrite: NEGATIVE
Protein, ur: 100 mg/dL — AB
RBC / HPF: >50 RBC/hpf (ref 0–5)
Specific Gravity, Urine: 1.014 (ref 1.005–1.030)
WBC, UA: >50 WBC/hpf (ref 0–5)
pH: 8 (ref 5.0–8.0)

## 2022-09-13 LAB — COMPREHENSIVE METABOLIC PANEL
ALT: 18 U/L (ref 0–44)
AST: 19 U/L (ref 15–41)
Albumin: 3.7 g/dL (ref 3.5–5.0)
Alkaline Phosphatase: 39 U/L (ref 38–126)
Anion gap: 8 (ref 5–15)
BUN: 26 mg/dL — ABNORMAL HIGH (ref 8–23)
CO2: 27 mmol/L (ref 22–32)
Calcium: 9 mg/dL (ref 8.9–10.3)
Chloride: 102 mmol/L (ref 98–111)
Creatinine, Ser: 1 mg/dL (ref 0.44–1.00)
GFR, Estimated: 54 mL/min — ABNORMAL LOW (ref >60)
Glucose, Bld: 115 mg/dL — ABNORMAL HIGH (ref 70–99)
Potassium: 4.3 mmol/L (ref 3.5–5.1)
Sodium: 137 mmol/L (ref 135–145)
Total Bilirubin: 0.6 mg/dL (ref 0.3–1.2)
Total Protein: 6.2 g/dL — ABNORMAL LOW (ref 6.5–8.1)

## 2022-09-13 LAB — LIPASE, BLOOD: Lipase: 37 U/L (ref 11–51)

## 2022-09-13 MED ORDER — CEPHALEXIN 500 MG PO CAPS
500.0000 mg | ORAL_CAPSULE | Freq: Once | ORAL | Status: AC
  Administered 2022-09-13: 500 mg via ORAL
  Filled 2022-09-13: qty 1

## 2022-09-13 MED ORDER — CEPHALEXIN 500 MG PO CAPS: 500.0000 mg | ORAL_CAPSULE | Freq: Three times a day (TID) | ORAL | 0 refills | Status: AC

## 2022-09-13 NOTE — Discharge Instructions (Signed)
Please follow-up with urology in the next 7 days if blood in urine does not improve. Antibiotics sent to pharmacy for urinary tract infection.

## 2022-09-13 NOTE — ED Provider Notes (Signed)
[place] EMERGENCY DEPARTMENT AT [facility] Provider Note   CSN: [id number] Arrival date & time: 09/13/22  4098     History  Chief Complaint  Patient presents with   Hematuria    Lori George is a 89 y.o. female.  Patient is an 89 year old female with past medical history of saddle pulmonary embolism on Eliquis presenting for hematuria.  Patient states yesterday she had multiple episodes of incontinence followed by hematuria and blood clots.  Her family held her first dose of Eliquis this morning.  She admits to some generalized abdominal pain with left flank pain.  History of nephrolithiasis.  Denies frequent urinary tract infections.  The history is provided by the patient. No language interpreter was used.  Hematuria Pertinent negatives include no chest pain, no abdominal pain and no shortness of breath.       Home Medications Prior to Admission medications   Medication Sig Start Date End Date Taking? Authorizing Provider  cephALEXin (KEFLEX) 500 MG capsule Take 1 capsule (500 mg total) by mouth 3 (three) times daily for 7 days. 09/13/22 09/20/22 Yes Edwin Dada P, DO  acetaminophen (TYLENOL) 500 MG tablet Take 500-1,000 mg by mouth every 6 (six) hours as needed for mild pain or moderate pain.    [provider]  aspirin EC 81 MG tablet Take 1 tablet (81 mg total) by mouth 2 (two) times daily. For DVT prophylaxis for 30 days after surgery. 03/12/21   Jenne Pane, PA-C  benzonatate (TESSALON) 100 MG capsule Take 1 capsule (100 mg total) by mouth 3 (three) times daily as needed for cough. Do not take with alcohol or while driving or operating heavy machinery.  May cause drowsiness. Patient not taking: Reported on 03/28/2022 03/01/22   Valentino Nose, NP  brimonidine [facility]) 0.2 % ophthalmic solution Place 1 drop into both eyes 2 (two) times daily.  Patient not taking: Reported on 03/08/2021 05/30/14   [provider]  calcium carbonate (TUMS  - DOSED IN MG ELEMENTAL CALCIUM) 500 MG chewable tablet Chew 1 tablet by mouth daily as needed for heartburn.     [provider]  CALCIUM PO Take 1 tablet by mouth daily.    [provider]  ELIQUIS 5 MG TABS tablet Take 5 mg by mouth 2 (two) times daily. 01/09/22   [provider]  famotidine (PEPCID) 20 MG tablet Take 20 mg by mouth daily. 01/09/22   [provider]  ipratropium (ATROVENT) 0.06 % nasal spray Place 2 sprays into both nostrils 3 (three) times daily. 01/11/22   [provider]  latanoprost (XALATAN) 0.005 % ophthalmic solution SMARTSIG:In Eye(s) 03/17/22   [provider]  Levothyroxine Sodium 75 MCG CAPS Take 75 mcg by mouth daily before breakfast.    [provider]  metoprolol tartrate (LOPRESSOR) 25 MG tablet Take 25 mg by mouth 2 (two) times daily. 01/09/22   [provider]  Multiple Vitamins-Minerals (MULTIVITAMIN WITH MINERALS) tablet Take 1 tablet by mouth daily.    [provider]  ondansetron (ZOFRAN-ODT) 4 MG disintegrating tablet Take 1 tablet (4 mg total) by mouth every 8 (eight) hours as needed for nausea. 03/28/22   Eber Hong, MD  pantoprazole (PROTONIX) 40 MG tablet Take 1 tablet by mouth daily. 06/06/17   [provider]  VITAMIN D, CHOLECALCIFEROL, PO Take 1 capsule by mouth daily.    [provider]      Allergies    Patient has no known  allergies.    Review of Systems   Review of Systems  Constitutional:  Negative for chills and fever.  HENT:  Negative for ear pain and sore throat.   Eyes:  Negative for pain and visual disturbance.  Respiratory:  Negative for cough and shortness of breath.   Cardiovascular:  Negative for chest pain and palpitations.  Gastrointestinal:  Negative for abdominal pain and vomiting.  Genitourinary:  Positive for flank pain, frequency and hematuria. Negative for dysuria.  Musculoskeletal:  Negative for arthralgias and back pain.   Skin:  Negative for color change and rash.  Neurological:  Negative for seizures and syncope.  All other systems reviewed and are negative.   Physical Exam Updated Vital Signs BP (!) 113/57   Pulse 61   Temp 97.9 F (36.6 C) (Oral)   Resp 18   SpO2 95%  Physical Exam Vitals and nursing note reviewed.  Constitutional:      General: She is not in acute distress.    Appearance: She is well-developed.  HENT:     Head: Normocephalic and atraumatic.  Eyes:     Conjunctiva/sclera: Conjunctivae normal.  Cardiovascular:     Rate and Rhythm: Normal rate and regular rhythm.     Heart sounds: No murmur heard. Pulmonary:     Effort: Pulmonary effort is normal. No respiratory distress.     Breath sounds: Normal breath sounds.  Abdominal:     Palpations: Abdomen is soft.     Tenderness: There is generalized abdominal tenderness. There is no right CVA tenderness, left CVA tenderness, guarding or rebound. Positive signs include Rovsing's sign. Negative signs include Murphy's sign.  Musculoskeletal:        General: No swelling.     Cervical back: Neck supple.  Skin:    General: Skin is warm and dry.     Capillary Refill: Capillary refill takes less than 2 seconds.  Neurological:     Mental Status: She is alert.  Psychiatric:        Mood and Affect: Mood normal.     ED Results / Procedures / Treatments   Labs (all labs ordered are listed, but only abnormal results are displayed) Labs Reviewed  URINALYSIS, ROUTINE W REFLEX MICROSCOPIC - Abnormal; Notable for the following components:      Result Value   Color, Urine RED (*)    APPearance CLOUDY (*)    Hgb urine dipstick LARGE (*)    Protein, ur 100 (*)    Leukocytes,Ua MODERATE (*)    All other components within normal limits  COMPREHENSIVE METABOLIC PANEL - Abnormal; Notable for the following components:   Glucose, Bld 115 (*)    BUN 26 (*)    Total Protein 6.2 (*)    GFR, Estimated 54 (*)    All other components within  normal limits  CBC WITH DIFFERENTIAL/PLATELET  LIPASE, BLOOD    EKG None  Radiology CT Renal Stone Study  Result Date: 09/13/2022 CLINICAL DATA:  Abdominal/flank pain, stone suspected EXAM: CT ABDOMEN AND PELVIS WITHOUT CONTRAST TECHNIQUE: Multidetector CT imaging of the abdomen and pelvis was performed following the standard protocol without IV contrast. RADIATION DOSE REDUCTION: This exam was performed according to the departmental dose-optimization program which includes automated exposure control, adjustment of the mA and/or kV according to patient size and/or use of iterative reconstruction technique. COMPARISON:  CT scan abdomen and pelvis from 09/30/2015. FINDINGS: Lower chest: There are atelectatic changes in the visualized lung bases. No overt consolidation. No pleural  effusion. There are linear areas of calcifications in the bilateral lungs, nonspecific but favored benign. The heart is normal in size. No pericardial effusion. Hepatobiliary: The liver is normal in size. Non-cirrhotic configuration. No suspicious mass. No intrahepatic or extrahepatic bile duct dilation. There is a sub 5 mm probable layering calcified gallstone. Normal gallbladder wall thickness. No pericholecystic inflammatory changes. Pancreas: Unremarkable. No pancreatic ductal dilatation or surrounding inflammatory changes. Spleen: Within normal limits. No focal lesion. Adrenals/Urinary Tract: Adrenal glands are unremarkable. No suspicious renal mass. There are multiple (5-7), sub 5 mm nonobstructing calculi in bilateral kidneys. There is dilated left upper ureter. Mild fullness in the left renal collecting system. However, left mid/lower ureter is not well-visualized due to lack of contrast opacification as well as extensive streak artifacts secondary to left hip hardware. No right hydroureteronephrosis. There is a an approximately 9 x 9 mm hyperattenuating partially exophytic structure arising from the right kidney lower  pole, anteriorly, which is too small to adequately characterize but present since the prior study and may represent proteinaceous/hemorrhagic cyst. PLEASE NOTE THAT THE EVALUATION OF PELVIC VISCERA IS MILDLY LIMITED DUE TO STREAK ARTIFACTS FROM LEFT HIP PROSTHESIS. Unremarkable urinary bladder. Stomach/Bowel: No disproportionate dilation of the small or large bowel loops. No evidence of abnormal bowel wall thickening or inflammatory changes. The cecum is mobile and lies in right upper quadrant. The appendix was not visualized; however there is no acute inflammatory process in the right upper quadrant. There are scattered diverticula mainly in the sigmoid colon, without imaging signs of diverticulitis. There are also small diverticula arising from the distal duodenum. Vascular/Lymphatic: No ascites or pneumoperitoneum. No abdominal or pelvic lymphadenopathy, by size criteria. No aneurysmal dilation of the major abdominal arteries. There are mild peripheral atherosclerotic vascular calcifications of the aorta and its major branches. Reproductive: The uterus is unremarkable. No large adnexal mass. Other: The visualized soft tissues and abdominal wall are unremarkable. Musculoskeletal: There is diffuse osteopenia of the visualized osseous structures. No suspicious osseous lesions. There are moderate - marked multilevel degenerative changes in the visualized spine. Multiple thoracolumbar kyphoplasties noted. There is loss of height of T9 through L5 vertebrae except L3 vertebrae. Left hip arthroplasty noted. IMPRESSION: 1. Multiple (5-7), sub 5 mm nonobstructing calculi in bilateral kidneys. 2. Dilated left upper ureter with mild fullness in the left renal collecting system. However, left mid/lower ureter is not well-visualized due to lack of contrast opacification as well as extensive streak artifacts secondary to left hip hardware. No definite obstructing calculus identified. If clinically indicated, this could be  better assessed with CT urogram. 3. Multiple other nonacute observations, as described above. Aortic Atherosclerosis (ICD10-I70.0). Electronically Signed   By: Jules Schick M.D.   On: 09/13/2022 09:44    Procedures Procedures    Medications Ordered in ED Medications  cephALEXin (KEFLEX) capsule 500 mg (has no administration in time range)    ED Course/ Medical Decision Making/ A&P                             Medical Decision Making Amount and/or Complexity of Data Reviewed Labs: ordered. Radiology: ordered.  Risk Prescription drug management.   93:38 AM 89 year old female with past medical history of saddle pulmonary embolism on Eliquis presenting for hematuria.  Is alert and oriented x 3, no acute distress, afebrile, stable vital signs.  Abdomen is soft with minimal generalized tenderness.  No specific tenderness over the suprapubic region.  Positive Rovsing  sign on the left.  Patient declined pain medications.  Laboratory studies as interpreted by myself concerning for urinary tract infection with moderate leukocytes, negative for nitrites, and greater than 50 white blood cells.  Large amount of blood present.  CT renal stone demonstrates kidney stones but no ureterolithiasis.  Keflex given in ED and sent to pharmacy.  Patient recommended for close follow-up with urology if hematuria does not improve after treatment with antibiotics.  Patient in no distress and overall condition improved here in the ED. Detailed discussions were had with the patient regarding current findings, and need for close f/u with PCP or on call doctor. The patient has been instructed to return immediately if the symptoms worsen in any way for re-evaluation. Patient verbalized understanding and is in agreement with current care plan. All questions answered prior to discharge.           Final Clinical Impression(s) / ED Diagnoses Final diagnoses:  Acute cystitis with hematuria    Rx / DC  Orders ED Discharge Orders          Ordered    cephALEXin (KEFLEX) 500 MG capsule  3 times daily        09/13/22 1043              Franne Forts, DO 09/13/22 1044

## 2022-09-13 NOTE — ED Triage Notes (Signed)
Pt from home with reports of hematuria that started this morning. Pt also reports right flank/back pain. Pt taking eliquis.

## 2022-09-15 DIAGNOSIS — E039 Hypothyroidism, unspecified: Secondary | ICD-10-CM | POA: Diagnosis not present

## 2022-09-15 DIAGNOSIS — M810 Age-related osteoporosis without current pathological fracture: Secondary | ICD-10-CM | POA: Diagnosis not present

## 2022-09-15 DIAGNOSIS — J9601 Acute respiratory failure with hypoxia: Secondary | ICD-10-CM | POA: Diagnosis not present

## 2022-09-15 DIAGNOSIS — I25729 Atherosclerosis of autologous artery coronary artery bypass graft(s) with unspecified angina pectoris: Secondary | ICD-10-CM | POA: Diagnosis not present

## 2022-09-15 DIAGNOSIS — E7800 Pure hypercholesterolemia, unspecified: Secondary | ICD-10-CM | POA: Diagnosis not present

## 2022-09-15 DIAGNOSIS — I2699 Other pulmonary embolism without acute cor pulmonale: Secondary | ICD-10-CM | POA: Diagnosis not present

## 2022-09-15 DIAGNOSIS — K219 Gastro-esophageal reflux disease without esophagitis: Secondary | ICD-10-CM | POA: Diagnosis not present

## 2022-09-15 DIAGNOSIS — S72002A Fracture of unspecified part of neck of left femur, initial encounter for closed fracture: Secondary | ICD-10-CM | POA: Diagnosis not present

## 2022-09-15 DIAGNOSIS — N182 Chronic kidney disease, stage 2 (mild): Secondary | ICD-10-CM | POA: Diagnosis not present

## 2022-09-16 DIAGNOSIS — H905 Unspecified sensorineural hearing loss: Secondary | ICD-10-CM | POA: Diagnosis not present

## 2022-09-24 DIAGNOSIS — R350 Frequency of micturition: Secondary | ICD-10-CM | POA: Diagnosis not present

## 2022-09-24 DIAGNOSIS — Z6823 Body mass index (BMI) 23.0-23.9, adult: Secondary | ICD-10-CM | POA: Diagnosis not present

## 2022-09-24 DIAGNOSIS — Z713 Dietary counseling and surveillance: Secondary | ICD-10-CM | POA: Diagnosis not present

## 2022-09-24 DIAGNOSIS — R300 Dysuria: Secondary | ICD-10-CM | POA: Diagnosis not present

## 2022-10-14 DIAGNOSIS — H905 Unspecified sensorineural hearing loss: Secondary | ICD-10-CM | POA: Diagnosis not present

## 2022-10-25 DIAGNOSIS — H31012 Macula scars of posterior pole (postinflammatory) (post-traumatic), left eye: Secondary | ICD-10-CM | POA: Diagnosis not present

## 2022-10-25 DIAGNOSIS — H401122 Primary open-angle glaucoma, left eye, moderate stage: Secondary | ICD-10-CM | POA: Diagnosis not present

## 2022-10-25 DIAGNOSIS — H401113 Primary open-angle glaucoma, right eye, severe stage: Secondary | ICD-10-CM | POA: Diagnosis not present

## 2022-10-25 DIAGNOSIS — Z961 Presence of intraocular lens: Secondary | ICD-10-CM | POA: Diagnosis not present

## 2022-12-10 DIAGNOSIS — M79642 Pain in left hand: Secondary | ICD-10-CM | POA: Diagnosis not present

## 2022-12-10 DIAGNOSIS — M1990 Unspecified osteoarthritis, unspecified site: Secondary | ICD-10-CM | POA: Diagnosis not present

## 2022-12-10 DIAGNOSIS — Z2821 Immunization not carried out because of patient refusal: Secondary | ICD-10-CM | POA: Diagnosis not present

## 2022-12-10 DIAGNOSIS — Z713 Dietary counseling and surveillance: Secondary | ICD-10-CM | POA: Diagnosis not present

## 2022-12-10 DIAGNOSIS — E559 Vitamin D deficiency, unspecified: Secondary | ICD-10-CM | POA: Diagnosis not present

## 2022-12-10 DIAGNOSIS — M810 Age-related osteoporosis without current pathological fracture: Secondary | ICD-10-CM | POA: Diagnosis not present

## 2022-12-10 DIAGNOSIS — R7989 Other specified abnormal findings of blood chemistry: Secondary | ICD-10-CM | POA: Diagnosis not present

## 2022-12-10 DIAGNOSIS — M79641 Pain in right hand: Secondary | ICD-10-CM | POA: Diagnosis not present

## 2022-12-10 DIAGNOSIS — R5383 Other fatigue: Secondary | ICD-10-CM | POA: Diagnosis not present

## 2023-02-24 DIAGNOSIS — M810 Age-related osteoporosis without current pathological fracture: Secondary | ICD-10-CM | POA: Diagnosis not present

## 2023-03-03 DIAGNOSIS — I25729 Atherosclerosis of autologous artery coronary artery bypass graft(s) with unspecified angina pectoris: Secondary | ICD-10-CM | POA: Diagnosis not present

## 2023-03-03 DIAGNOSIS — E039 Hypothyroidism, unspecified: Secondary | ICD-10-CM | POA: Diagnosis not present

## 2023-03-03 DIAGNOSIS — M810 Age-related osteoporosis without current pathological fracture: Secondary | ICD-10-CM | POA: Diagnosis not present

## 2023-03-03 DIAGNOSIS — R7303 Prediabetes: Secondary | ICD-10-CM | POA: Diagnosis not present

## 2023-03-11 DIAGNOSIS — R636 Underweight: Secondary | ICD-10-CM | POA: Diagnosis not present

## 2023-03-11 DIAGNOSIS — Z713 Dietary counseling and surveillance: Secondary | ICD-10-CM | POA: Diagnosis not present

## 2023-03-11 DIAGNOSIS — E559 Vitamin D deficiency, unspecified: Secondary | ICD-10-CM | POA: Diagnosis not present

## 2023-03-11 DIAGNOSIS — M810 Age-related osteoporosis without current pathological fracture: Secondary | ICD-10-CM | POA: Diagnosis not present

## 2023-03-11 DIAGNOSIS — Z2821 Immunization not carried out because of patient refusal: Secondary | ICD-10-CM | POA: Diagnosis not present

## 2023-03-11 DIAGNOSIS — R7989 Other specified abnormal findings of blood chemistry: Secondary | ICD-10-CM | POA: Diagnosis not present

## 2023-03-11 DIAGNOSIS — M1990 Unspecified osteoarthritis, unspecified site: Secondary | ICD-10-CM | POA: Diagnosis not present

## 2023-03-11 DIAGNOSIS — Z79899 Other long term (current) drug therapy: Secondary | ICD-10-CM | POA: Diagnosis not present

## 2023-03-11 DIAGNOSIS — R5383 Other fatigue: Secondary | ICD-10-CM | POA: Diagnosis not present

## 2023-03-16 DIAGNOSIS — H401122 Primary open-angle glaucoma, left eye, moderate stage: Secondary | ICD-10-CM | POA: Diagnosis not present

## 2023-03-16 DIAGNOSIS — H401113 Primary open-angle glaucoma, right eye, severe stage: Secondary | ICD-10-CM | POA: Diagnosis not present

## 2023-03-16 DIAGNOSIS — H31012 Macula scars of posterior pole (postinflammatory) (post-traumatic), left eye: Secondary | ICD-10-CM | POA: Diagnosis not present

## 2023-03-16 DIAGNOSIS — Z961 Presence of intraocular lens: Secondary | ICD-10-CM | POA: Diagnosis not present

## 2023-03-21 DIAGNOSIS — E039 Hypothyroidism, unspecified: Secondary | ICD-10-CM | POA: Diagnosis not present

## 2023-03-21 DIAGNOSIS — Z136 Encounter for screening for cardiovascular disorders: Secondary | ICD-10-CM | POA: Diagnosis not present

## 2023-03-21 DIAGNOSIS — I480 Paroxysmal atrial fibrillation: Secondary | ICD-10-CM | POA: Diagnosis not present

## 2023-03-21 DIAGNOSIS — I10 Essential (primary) hypertension: Secondary | ICD-10-CM | POA: Diagnosis not present

## 2023-03-21 DIAGNOSIS — Z86711 Personal history of pulmonary embolism: Secondary | ICD-10-CM | POA: Diagnosis not present

## 2023-03-31 DIAGNOSIS — I700 Atherosclerosis of aorta: Secondary | ICD-10-CM | POA: Diagnosis not present

## 2023-03-31 DIAGNOSIS — M810 Age-related osteoporosis without current pathological fracture: Secondary | ICD-10-CM | POA: Diagnosis not present

## 2023-03-31 DIAGNOSIS — N182 Chronic kidney disease, stage 2 (mild): Secondary | ICD-10-CM | POA: Diagnosis not present

## 2023-03-31 DIAGNOSIS — M545 Low back pain, unspecified: Secondary | ICD-10-CM | POA: Diagnosis not present

## 2023-03-31 DIAGNOSIS — I25729 Atherosclerosis of autologous artery coronary artery bypass graft(s) with unspecified angina pectoris: Secondary | ICD-10-CM | POA: Diagnosis not present

## 2023-03-31 DIAGNOSIS — E7800 Pure hypercholesterolemia, unspecified: Secondary | ICD-10-CM | POA: Diagnosis not present

## 2023-03-31 DIAGNOSIS — K219 Gastro-esophageal reflux disease without esophagitis: Secondary | ICD-10-CM | POA: Diagnosis not present

## 2023-03-31 DIAGNOSIS — E039 Hypothyroidism, unspecified: Secondary | ICD-10-CM | POA: Diagnosis not present

## 2023-03-31 DIAGNOSIS — Z0001 Encounter for general adult medical examination with abnormal findings: Secondary | ICD-10-CM | POA: Diagnosis not present

## 2023-04-06 DIAGNOSIS — M546 Pain in thoracic spine: Secondary | ICD-10-CM | POA: Diagnosis not present

## 2023-04-06 DIAGNOSIS — M545 Low back pain, unspecified: Secondary | ICD-10-CM | POA: Diagnosis not present

## 2023-04-14 DIAGNOSIS — M546 Pain in thoracic spine: Secondary | ICD-10-CM | POA: Diagnosis not present

## 2023-04-14 DIAGNOSIS — M545 Low back pain, unspecified: Secondary | ICD-10-CM | POA: Diagnosis not present

## 2023-08-01 DIAGNOSIS — K219 Gastro-esophageal reflux disease without esophagitis: Secondary | ICD-10-CM | POA: Diagnosis not present

## 2023-08-01 DIAGNOSIS — I4891 Unspecified atrial fibrillation: Secondary | ICD-10-CM | POA: Diagnosis not present

## 2023-08-01 DIAGNOSIS — E7800 Pure hypercholesterolemia, unspecified: Secondary | ICD-10-CM | POA: Diagnosis not present

## 2023-08-01 DIAGNOSIS — M1990 Unspecified osteoarthritis, unspecified site: Secondary | ICD-10-CM | POA: Diagnosis not present

## 2023-08-03 NOTE — Progress Notes (Unsigned)
 Referring Provider: Chana Comas, FNP  Primary Care Physician:  Omie Bickers, MD Primary Gastroenterologist:  Dr. Alita Irwin  Chief Complaint  Patient presents with   Abdominal Pain    Having tightness around mid abdominal area and around back. Swelling after she eats. Change in bowel habits     HPI:   Lori George is a 89 y.o. female presenting today at the request of Chana Comas, FNP for abdominal distension.   Today:  Greater than 39-month history of generalized abdominal tightness which seems to be constant, but more recently with worsening postprandially. Feels like she can't take a deep breath when it gets severe.  Feels that her abdomen gets bloated. Intermittent nausea not specifically related to meals or any other identified trigger.  No vomiting. No GERD symptoms or dysphagia.    Reports no imaging of her abdomen to evaluate the symptoms. Not sure when last labs have been completed.   Was having some constipation previously, but seems to be doing ok for the last 4 to 5 weeks.  She taking MiraLAX  daily and having a bowel movement daily.  No BRBPR or melena.  No weight loss.  Reports no improvement in her abdominal tightness since constipation improved.   No NSAIDs.  No ETOH.    Colonoscopy 05/26/2005: Endoscopic changes typical of ischemic colitis with most extensive involvement of descending colon and some involvement of proximal sigmoid colon.  Follow-up CT angio with no evidence of mesenteric ischemia.  Past Medical History:  Diagnosis Date   Glaucoma    Ischemic colitis (HCC) 2007   Pulmonary embolism (HCC)    saddle PE, occured after a fall 2023   Stroke [facility])    Thyroid  disease     Past Surgical History:  Procedure Laterality Date   BACK SURGERY     COLONOSCOPY  05/26/2005   Endoscopic changes typical of ischemic colitis with most extensive involvement of descending colon and some involvement of proximal sigmoid colon..  Pathology consistent with ischemic  colitis.   HIP ARTHROPLASTY Left 03/10/2021   Procedure: ARTHROPLASTY BIPOLAR HIP (HEMIARTHROPLASTY);  Surgeon: Saundra Curl, MD;  Location: [facility] ORS;  Service: Orthopedics;  Laterality: Left;   IR FLUORO GUIDED NEEDLE PLC ASPIRATION/INJECTION LOC  06/17/2017   IR KYPHO EA ADDL LEVEL THORACIC OR LUMBAR  06/17/2017   IR KYPHO THORACIC WITH BONE BIOPSY  06/17/2017   IR RADIOLOGIST EVAL & MGMT  06/13/2017    Current Outpatient Medications  Medication Sig Dispense Refill   acetaminophen  (TYLENOL ) 500 MG tablet Take 500-1,000 mg by mouth every 6 (six) hours as needed for mild pain or moderate pain.     calcium  carbonate (TUMS - DOSED IN MG ELEMENTAL CALCIUM ) 500 MG chewable tablet Chew 1 tablet by mouth daily as needed for heartburn.      CALCIUM  PO Take 1 tablet by mouth daily.     ELIQUIS 5 MG TABS tablet Take 5 mg by mouth 2 (two) times daily.     Levothyroxine  Sodium 75 MCG CAPS Take 75 mcg by mouth daily before breakfast.     Multiple Vitamins-Minerals (MULTIVITAMIN WITH MINERALS) tablet Take 1 tablet by mouth daily.     omeprazole (PRILOSEC) 40 MG capsule take one capsule PO daily for GERD take one capsule PO daily for GERD     VITAMIN D, CHOLECALCIFEROL, PO Take 1 capsule by mouth daily.     aspirin  EC 81 MG tablet Take 1 tablet (81 mg total) by mouth 2 (two) times  daily. For DVT prophylaxis for 30 days after surgery. (Patient not taking: Reported on 08/04/2023) 60 tablet 0   benzonatate  (TESSALON ) 100 MG capsule Take 1 capsule (100 mg total) by mouth 3 (three) times daily as needed for cough. Do not take with alcohol or while driving or operating heavy machinery.  May cause drowsiness. (Patient not taking: Reported on 03/28/2022) 21 capsule 0   brimonidine  (ALPHAGAN ) 0.2 % ophthalmic solution Place 1 drop into both eyes 2 (two) times daily.  (Patient not taking: Reported on 03/08/2021)     famotidine  (PEPCID ) 20 MG tablet Take 20 mg by mouth daily. (Patient not taking: Reported on 08/04/2023)      ipratropium (ATROVENT) 0.06 % nasal spray Place 2 sprays into both nostrils 3 (three) times daily. (Patient not taking: Reported on 08/04/2023)     latanoprost (XALATAN) 0.005 % ophthalmic solution SMARTSIG:In Eye(s) (Patient not taking: Reported on 08/04/2023)     metoprolol tartrate (LOPRESSOR) 25 MG tablet Take 25 mg by mouth 2 (two) times daily. (Patient not taking: Reported on 08/04/2023)     ondansetron  (ZOFRAN -ODT) 4 MG disintegrating tablet Take 1 tablet (4 mg total) by mouth every 8 (eight) hours as needed for nausea. (Patient not taking: Reported on 08/04/2023) 10 tablet 0   pantoprazole  (PROTONIX ) 40 MG tablet Take 1 tablet by mouth daily. (Patient not taking: Reported on 08/04/2023)     No current facility-administered medications for this visit.    Allergies as of 08/04/2023   (No Known Allergies)    History reviewed. No pertinent family history.  Social History   Socioeconomic History   Marital status: Widowed    Spouse name: Not on file   Number of children: 2   Years of education: 50   Highest education level: 12th grade  Occupational History   Not on file  Tobacco Use   Smoking status: Never    Passive exposure: Never   Smokeless tobacco: Never  Vaping Use   Vaping status: Never Used  Substance and Sexual Activity   Alcohol use: No   Drug use: No   Sexual activity: Not Currently  Other Topics Concern   Not on file  Social History Narrative   Not on file   Social Drivers of Health   Financial Resource Strain: Low Risk  (12/01/2021)   Overall Financial Resource Strain (CARDIA)    Difficulty of Paying Living Expenses: Not hard at all  Food Insecurity: No Food Insecurity (12/01/2021)   Hunger Vital Sign    Worried About Running Out of Food in the Last Year: Never true    Ran Out of Food in the Last Year: Never true  Transportation Needs: No Transportation Needs (12/01/2021)   PRAPARE - Administrator, Civil Service (Medical): No    Lack of  Transportation (Non-Medical): No  Physical Activity: Inactive (12/01/2021)   Exercise Vital Sign    Days of Exercise per Week: 0 days    Minutes of Exercise per Session: 0 min  Stress: No Stress Concern Present (12/01/2021)   [employer] of Occupational Health - Occupational Stress Questionnaire    Feeling of Stress : Not at all  Social Connections: Moderately Integrated (12/01/2021)   Social Connection and Isolation Panel [NHANES]    Frequency of Communication with Friends and Family: More than three times a week    Frequency of Social Gatherings with Friends and Family: More than three times a week    Attends Religious Services: More than 4 times  per year    Active Member of Clubs or Organizations: Yes    Attends Banker Meetings: More than 4 times per year    Marital Status: Widowed  Intimate Partner Violence: Not At Risk (12/01/2021)   Humiliation, Afraid, Rape, and Kick questionnaire    Fear of Current or Ex-Partner: No    Emotionally Abused: No    Physically Abused: No    Sexually Abused: No    Review of Systems: Gen: Denies any fever, chills, cold or flulike symptoms, presyncope, syncope. CV: Denies chest pain, heart palpitations. Resp: Denies shortness of breath at rest, cough. GI: See HPI GU : Denies urinary burning, urinary frequency, urinary hesitancy MS: Denies joint pain..  Derm: Denies rash. Psych: Denies depression, anxiety. Heme: See HPI  Physical Exam: BP 133/80 (BP Location: Right Arm, Patient Position: Sitting, Cuff Size: Normal)   Pulse 70   Temp 97.9 F (36.6 C) (Temporal)   Ht 5\' 4"  (1.626 m)   Wt 109 lb 3.2 oz (49.5 kg)   BMI 18.74 kg/m  General:   Alert and oriented. Pleasant and cooperative. Well-nourished and well-developed.  Head:  Normocephalic and atraumatic. Eyes:  Without icterus, sclera clear and conjunctiva pink.  Ears:  Normal auditory acuity. Lungs:  Clear to auscultation bilaterally. No wheezes, rales, or rhonchi. No  distress.  Heart:  S1, S2 present without murmurs appreciated.  Abdomen:  +BS, soft, and non-distended.  Mild generalized TTP, greatest across the lower abdomen.  No HSM noted. No guarding or rebound. No masses appreciated.  Rectal:  Deferred  Msk:  Symmetrical without gross deformities. Normal posture. Extremities:  Without edema. Neurologic:  Alert and  oriented x4;  grossly normal neurologically. Skin:  Intact without significant lesions or rashes. Psych: Normal mood and affect.    Assessment:  89 year old female with history of PE chronically anticoagulated with Eliquis, stroke, hypothyroidism, GERD, glaucoma, episode of ischemic colitis in 2007 with follow-up CT angio with patency of mesenteric arteries, presenting today with chief complaint of 6+ month history of generalized abdominal tightness, but more recently with worsening symptoms postprandially, intermittent nausea without vomiting.  Reports prior constipation, but this is now controlled with MiraLAX , but no improvement in her symptoms.  Chronic GERD well-controlled on omeprazole.  Denies NSAIDs.  On exam, she has a nondistended abdomen with moderate generalized TTP, greatest across the lower abdomen.  Etiology of her abdominal pain is not clear.  Will arrange CT A/P with contrast and update labs for further evaluation.    Plan:  CT A/P with IV and oral contrast ASAP. CBC, CMP Further recommendations to follow   Lavanda Porter [facility] Gastroenterology 08/04/2023

## 2023-08-04 ENCOUNTER — Encounter: Payer: Self-pay | Admitting: *Deleted

## 2023-08-04 ENCOUNTER — Encounter: Payer: Self-pay | Admitting: Gastroenterology

## 2023-08-04 ENCOUNTER — Telehealth: Payer: Self-pay | Admitting: *Deleted

## 2023-08-04 ENCOUNTER — Ambulatory Visit (INDEPENDENT_AMBULATORY_CARE_PROVIDER_SITE_OTHER): Admitting: Gastroenterology

## 2023-08-04 VITALS — BP 133/80 | HR 70 | Temp 97.9°F | Ht 64.0 in | Wt 109.2 lb

## 2023-08-04 DIAGNOSIS — R1084 Generalized abdominal pain: Secondary | ICD-10-CM

## 2023-08-04 DIAGNOSIS — R10817 Generalized abdominal tenderness: Secondary | ICD-10-CM | POA: Diagnosis not present

## 2023-08-04 DIAGNOSIS — K219 Gastro-esophageal reflux disease without esophagitis: Secondary | ICD-10-CM

## 2023-08-04 DIAGNOSIS — R110 Nausea: Secondary | ICD-10-CM | POA: Diagnosis not present

## 2023-08-04 NOTE — Telephone Encounter (Signed)
 [employer] PA: CPT Code 16109 Description: CT ABDOMEN & PELVIS W/ Case Number: [id number] Review Date: 08/04/2023 3:13:20 PM Expiration Date: N/A Status: This member's benefit plan did not require a prior authorization for this request.

## 2023-08-04 NOTE — Patient Instructions (Addendum)
 Please have labs completed at [facility].  [address] [place],  [place]  [address] Or   [address] [place],  [place]  [address]   We will arrange for you to have CT of your abdomen and pelvis at [facility].   Shana Daring, PA-C [facility] Gastroenterology

## 2023-08-05 ENCOUNTER — Ambulatory Visit: Payer: Self-pay | Admitting: Gastroenterology

## 2023-08-05 DIAGNOSIS — K5904 Chronic idiopathic constipation: Secondary | ICD-10-CM

## 2023-08-05 LAB — CBC WITH DIFFERENTIAL/PLATELET
Basophils Absolute: 0.1 10*3/uL (ref 0.0–0.2)
Basos: 1 %
EOS (ABSOLUTE): 0 10*3/uL (ref 0.0–0.4)
Eos: 1 %
Hematocrit: 42.4 % (ref 34.0–46.6)
Hemoglobin: 14.4 g/dL (ref 11.1–15.9)
Immature Grans (Abs): 0 10*3/uL (ref 0.0–0.1)
Immature Granulocytes: 0 %
Lymphocytes Absolute: 1.7 10*3/uL (ref 0.7–3.1)
Lymphs: 30 %
MCH: 32.7 pg (ref 26.6–33.0)
MCHC: 34 g/dL (ref 31.5–35.7)
MCV: 96 fL (ref 79–97)
Monocytes Absolute: 0.5 10*3/uL (ref 0.1–0.9)
Monocytes: 8 %
Neutrophils Absolute: 3.5 10*3/uL (ref 1.4–7.0)
Neutrophils: 60 %
Platelets: 225 10*3/uL (ref 150–450)
RBC: 4.4 x10E6/uL (ref 3.77–5.28)
RDW: 12.1 % (ref 11.7–15.4)
WBC: 5.7 10*3/uL (ref 3.4–10.8)

## 2023-08-05 LAB — CMP14+EGFR
ALT: 20 IU/L (ref 0–32)
AST: 23 IU/L (ref 0–40)
Albumin: 4.6 g/dL (ref 3.7–4.7)
Alkaline Phosphatase: 56 IU/L (ref 44–121)
BUN/Creatinine Ratio: 27 (ref 12–28)
BUN: 25 mg/dL (ref 8–27)
Bilirubin Total: 0.3 mg/dL (ref 0.0–1.2)
CO2: 25 mmol/L (ref 20–29)
Calcium: 10 mg/dL (ref 8.7–10.3)
Chloride: 102 mmol/L (ref 96–106)
Creatinine, Ser: 0.91 mg/dL (ref 0.57–1.00)
Globulin, Total: 2.2 g/dL (ref 1.5–4.5)
Glucose: 89 mg/dL (ref 70–99)
Potassium: 5.4 mmol/L — ABNORMAL HIGH (ref 3.5–5.2)
Sodium: 141 mmol/L (ref 134–144)
Total Protein: 6.8 g/dL (ref 6.0–8.5)
eGFR: 61 mL/min/{1.73_m2} (ref >59)

## 2023-08-06 ENCOUNTER — Ambulatory Visit (HOSPITAL_BASED_OUTPATIENT_CLINIC_OR_DEPARTMENT_OTHER)
Admission: RE | Admit: 2023-08-06 | Discharge: 2023-08-06 | Disposition: A | Discharge status: Home / Self Care | Source: Ambulatory Visit | Attending: Gastroenterology

## 2023-08-06 DIAGNOSIS — R1084 Generalized abdominal pain: Secondary | ICD-10-CM | POA: Insufficient documentation

## 2023-08-06 DIAGNOSIS — R110 Nausea: Secondary | ICD-10-CM | POA: Diagnosis not present

## 2023-08-06 DIAGNOSIS — R140 Abdominal distension (gaseous): Secondary | ICD-10-CM | POA: Diagnosis not present

## 2023-08-06 DIAGNOSIS — N200 Calculus of kidney: Secondary | ICD-10-CM | POA: Diagnosis not present

## 2023-08-06 MED ORDER — IOHEXOL 300 MG/ML  SOLN
100.0000 mL | Freq: Once | INTRAMUSCULAR | Status: AC | PRN
  Administered 2023-08-06: 75 mL via INTRAVENOUS

## 2023-08-09 DIAGNOSIS — K219 Gastro-esophageal reflux disease without esophagitis: Secondary | ICD-10-CM | POA: Diagnosis not present

## 2023-08-09 DIAGNOSIS — E039 Hypothyroidism, unspecified: Secondary | ICD-10-CM | POA: Diagnosis not present

## 2023-08-09 DIAGNOSIS — I2699 Other pulmonary embolism without acute cor pulmonale: Secondary | ICD-10-CM | POA: Diagnosis not present

## 2023-08-09 DIAGNOSIS — I4891 Unspecified atrial fibrillation: Secondary | ICD-10-CM | POA: Diagnosis not present

## 2023-08-09 DIAGNOSIS — G8929 Other chronic pain: Secondary | ICD-10-CM | POA: Diagnosis not present

## 2023-08-09 DIAGNOSIS — M810 Age-related osteoporosis without current pathological fracture: Secondary | ICD-10-CM | POA: Diagnosis not present

## 2023-08-09 DIAGNOSIS — M545 Low back pain, unspecified: Secondary | ICD-10-CM | POA: Diagnosis not present

## 2023-08-09 DIAGNOSIS — R7989 Other specified abnormal findings of blood chemistry: Secondary | ICD-10-CM | POA: Diagnosis not present

## 2023-08-09 DIAGNOSIS — K5909 Other constipation: Secondary | ICD-10-CM | POA: Diagnosis not present

## 2023-08-09 DIAGNOSIS — I700 Atherosclerosis of aorta: Secondary | ICD-10-CM | POA: Diagnosis not present

## 2023-08-09 DIAGNOSIS — E7800 Pure hypercholesterolemia, unspecified: Secondary | ICD-10-CM | POA: Diagnosis not present

## 2023-08-14 MED ORDER — LINACLOTIDE 145 MCG PO CAPS: 145.0000 ug | ORAL_CAPSULE | Freq: Every day | ORAL | 3 refills | Status: DC

## 2023-08-16 NOTE — Progress Notes (Signed)
 Pt is schedule for follow up appt.

## 2023-08-16 NOTE — Progress Notes (Signed)
 Left pt voicemail to schedule follow up 4 week appt

## 2023-08-26 DIAGNOSIS — I2699 Other pulmonary embolism without acute cor pulmonale: Secondary | ICD-10-CM | POA: Diagnosis not present

## 2023-08-26 DIAGNOSIS — E7800 Pure hypercholesterolemia, unspecified: Secondary | ICD-10-CM | POA: Diagnosis not present

## 2023-08-26 DIAGNOSIS — K5909 Other constipation: Secondary | ICD-10-CM | POA: Diagnosis not present

## 2023-08-26 DIAGNOSIS — K219 Gastro-esophageal reflux disease without esophagitis: Secondary | ICD-10-CM | POA: Diagnosis not present

## 2023-08-26 DIAGNOSIS — I700 Atherosclerosis of aorta: Secondary | ICD-10-CM | POA: Diagnosis not present

## 2023-08-26 DIAGNOSIS — R7989 Other specified abnormal findings of blood chemistry: Secondary | ICD-10-CM | POA: Diagnosis not present

## 2023-08-26 DIAGNOSIS — M545 Low back pain, unspecified: Secondary | ICD-10-CM | POA: Diagnosis not present

## 2023-08-26 DIAGNOSIS — E039 Hypothyroidism, unspecified: Secondary | ICD-10-CM | POA: Diagnosis not present

## 2023-08-26 DIAGNOSIS — G8929 Other chronic pain: Secondary | ICD-10-CM | POA: Diagnosis not present

## 2023-08-26 DIAGNOSIS — M810 Age-related osteoporosis without current pathological fracture: Secondary | ICD-10-CM | POA: Diagnosis not present

## 2023-08-26 DIAGNOSIS — I4891 Unspecified atrial fibrillation: Secondary | ICD-10-CM | POA: Diagnosis not present

## 2023-08-30 DIAGNOSIS — K219 Gastro-esophageal reflux disease without esophagitis: Secondary | ICD-10-CM | POA: Diagnosis not present

## 2023-08-30 DIAGNOSIS — I4891 Unspecified atrial fibrillation: Secondary | ICD-10-CM | POA: Diagnosis not present

## 2023-08-30 DIAGNOSIS — M810 Age-related osteoporosis without current pathological fracture: Secondary | ICD-10-CM | POA: Diagnosis not present

## 2023-08-30 DIAGNOSIS — E7800 Pure hypercholesterolemia, unspecified: Secondary | ICD-10-CM | POA: Diagnosis not present

## 2023-09-12 NOTE — Progress Notes (Unsigned)
 Referring Provider: Shona Norleen PEDLAR, MD Primary Care Physician:  Shona Norleen PEDLAR, MD Primary GI Physician: Dr. Cinderella  No chief complaint on file.   HPI:   Lori George is a 89 y.o. female with history of PE chronically anticoagulated with Eliquis, stroke, hypothyroidism, GERD, glaucoma, episode of ischemic colitis in 2007 with follow-up CT angio with patency of mesenteric arteries, presenting today for follow-up of generalized abdominal pain/tightness.  Last seen in the office at the time of initial consult 08/04/2023 reporting 6+ month history of generalized abdominal tightness, but worsening symptoms postprandially recently, intermittent nausea without vomiting.  Reported prior constipation that was now controlled with MiraLAX , but no improvement in her other symptoms.  Chronic GERD well-controlled on omeprazole.  On exam, she had a nondistended abdomen with moderate generalized TTP, greatest across the lower abdomen.  Plan for CT A/P with contrast and labs.  CBC and CMP only remarkable for potassium 5.4.  CT A/P with contrast showed moderate colonic stool burden.  No obstruction.  Suspected constipation was a driving factor upon her chronic abdominal pain.  Recommended starting Linzess  145 mcg daily and follow-up in 4 weeks.   Today:      No NSAIDs.  No ETOH.   Colonoscopy 05/26/2005: Endoscopic changes typical of ischemic colitis with most extensive involvement of descending colon and some involvement of proximal sigmoid colon.   Follow-up CT angio with no evidence of mesenteric ischemia.  Past Medical History:  Diagnosis Date   Glaucoma    Ischemic colitis (HCC) 2007   Pulmonary embolism (HCC)    saddle PE, occured after a fall 2023   Stroke [facility])    Thyroid  disease     Past Surgical History:  Procedure Laterality Date   BACK SURGERY     COLONOSCOPY  05/26/2005   Endoscopic changes typical of ischemic colitis with most extensive involvement of descending colon and some  involvement of proximal sigmoid colon..  Pathology consistent with ischemic colitis.   HIP ARTHROPLASTY Left 03/10/2021   Procedure: ARTHROPLASTY BIPOLAR HIP (HEMIARTHROPLASTY);  Surgeon: Beverley Evalene BIRCH, MD;  Location: [facility];  Service: Orthopedics;  Laterality: Left;   IR FLUORO GUIDED NEEDLE PLC ASPIRATION/INJECTION LOC  06/17/2017   IR KYPHO EA ADDL LEVEL THORACIC OR LUMBAR  06/17/2017   IR KYPHO THORACIC WITH BONE BIOPSY  06/17/2017   IR RADIOLOGIST EVAL & MGMT  06/13/2017    Current Outpatient Medications  Medication Sig Dispense Refill   acetaminophen  (TYLENOL ) 500 MG tablet Take 500-1,000 mg by mouth every 6 (six) hours as needed for mild pain or moderate pain.     aspirin  EC 81 MG tablet Take 1 tablet (81 mg total) by mouth 2 (two) times daily. For DVT prophylaxis for 30 days after surgery. (Patient not taking: Reported on 08/04/2023) 60 tablet 0   benzonatate  (TESSALON ) 100 MG capsule Take 1 capsule (100 mg total) by mouth 3 (three) times daily as needed for cough. Do not take with alcohol or while driving or operating heavy machinery.  May cause drowsiness. (Patient not taking: Reported on 03/28/2022) 21 capsule 0   brimonidine  (ALPHAGAN ) 0.2 % ophthalmic solution Place 1 drop into both eyes 2 (two) times daily.  (Patient not taking: Reported on 03/08/2021)     calcium  carbonate (TUMS - DOSED IN MG ELEMENTAL CALCIUM ) 500 MG chewable tablet Chew 1 tablet by mouth daily as needed for heartburn.      CALCIUM  PO Take 1 tablet by mouth daily.  ELIQUIS 5 MG TABS tablet Take 5 mg by mouth 2 (two) times daily.     famotidine  (PEPCID ) 20 MG tablet Take 20 mg by mouth daily. (Patient not taking: Reported on 08/04/2023)     ipratropium (ATROVENT) 0.06 % nasal spray Place 2 sprays into both nostrils 3 (three) times daily. (Patient not taking: Reported on 08/04/2023)     latanoprost (XALATAN) 0.005 % ophthalmic solution SMARTSIG:In Eye(s) (Patient not taking: Reported on 08/04/2023)     Levothyroxine   Sodium 75 MCG CAPS Take 75 mcg by mouth daily before breakfast.     linaclotide  (LINZESS ) 145 MCG CAPS capsule Take 1 capsule (145 mcg total) by mouth daily before breakfast. 30 capsule 3   metoprolol tartrate (LOPRESSOR) 25 MG tablet Take 25 mg by mouth 2 (two) times daily. (Patient not taking: Reported on 08/04/2023)     Multiple Vitamins-Minerals (MULTIVITAMIN WITH MINERALS) tablet Take 1 tablet by mouth daily.     omeprazole (PRILOSEC) 40 MG capsule take one capsule PO daily for GERD take one capsule PO daily for GERD     ondansetron  (ZOFRAN -ODT) 4 MG disintegrating tablet Take 1 tablet (4 mg total) by mouth every 8 (eight) hours as needed for nausea. (Patient not taking: Reported on 08/04/2023) 10 tablet 0   pantoprazole  (PROTONIX ) 40 MG tablet Take 1 tablet by mouth daily. (Patient not taking: Reported on 08/04/2023)     VITAMIN D, CHOLECALCIFEROL, PO Take 1 capsule by mouth daily.     No current facility-administered medications for this visit.    Allergies as of 09/14/2023   (No Known Allergies)    No family history on file.  Social History   Socioeconomic History   Marital status: Widowed    Spouse name: Not on file   Number of children: 2   Years of education: 42   Highest education level: 12th grade  Occupational History   Not on file  Tobacco Use   Smoking status: Never    Passive exposure: Never   Smokeless tobacco: Never  Vaping Use   Vaping status: Never Used  Substance and Sexual Activity   Alcohol use: No   Drug use: No   Sexual activity: Not Currently  Other Topics Concern   Not on file  Social History Narrative   Not on file   Social Drivers of Health   Financial Resource Strain: Low Risk  (12/01/2021)   Overall Financial Resource Strain (CARDIA)    Difficulty of Paying Living Expenses: Not hard at all  Food Insecurity: No Food Insecurity (12/01/2021)   Hunger Vital Sign    Worried About Running Out of Food in the Last Year: Never true    Ran Out of Food  in the Last Year: Never true  Transportation Needs: No Transportation Needs (12/01/2021)   PRAPARE - Administrator, Civil Service (Medical): No    Lack of Transportation (Non-Medical): No  Physical Activity: Inactive (12/01/2021)   Exercise Vital Sign    Days of Exercise per Week: 0 days    Minutes of Exercise per Session: 0 min  Stress: No Stress Concern Present (12/01/2021)   [employer] of Occupational Health - Occupational Stress Questionnaire    Feeling of Stress : Not at all  Social Connections: Moderately Integrated (12/01/2021)   Social Connection and Isolation Panel    Frequency of Communication with Friends and Family: More than three times a week    Frequency of Social Gatherings with Friends and Family: More than three  times a week    Attends Religious Services: More than 4 times per year    Active Member of Clubs or Organizations: Yes    Attends Banker Meetings: More than 4 times per year    Marital Status: Widowed    Review of Systems: Gen: Denies fever, chills, anorexia. Denies fatigue, weakness, weight loss.  CV: Denies chest pain, palpitations, syncope, peripheral edema, and claudication. Resp: Denies dyspnea at rest, cough, wheezing, coughing up blood, and pleurisy. GI: Denies vomiting blood, jaundice, and fecal incontinence.   Denies dysphagia or odynophagia. Derm: Denies rash, itching, dry skin Psych: Denies depression, anxiety, memory loss, confusion. No homicidal or suicidal ideation.  Heme: Denies bruising, bleeding, and enlarged lymph nodes.  Physical Exam: There were no vitals taken for this visit. General:   Alert and oriented. No distress noted. Pleasant and cooperative.  Head:  Normocephalic and atraumatic. Eyes:  Conjuctiva clear without scleral icterus. Heart:  S1, S2 present without murmurs appreciated. Lungs:  Clear to auscultation bilaterally. No wheezes, rales, or rhonchi. No distress.  Abdomen:  +BS, soft, non-tender  and non-distended. No rebound or guarding. No HSM or masses noted. Msk:  Symmetrical without gross deformities. Normal posture. Extremities:  Without edema. Neurologic:  Alert and  oriented x4 Psych:  Normal mood and affect.    Assessment:     Plan:  ***   Josette Centers, PA-C [facility] 09/14/2023

## 2023-09-14 ENCOUNTER — Encounter: Payer: Self-pay | Admitting: Gastroenterology

## 2023-09-14 ENCOUNTER — Ambulatory Visit (INDEPENDENT_AMBULATORY_CARE_PROVIDER_SITE_OTHER): Admitting: Gastroenterology

## 2023-09-14 VITALS — BP 134/73 | HR 67 | Temp 98.2°F | Ht 64.0 in | Wt 109.2 lb

## 2023-09-14 DIAGNOSIS — K5904 Chronic idiopathic constipation: Secondary | ICD-10-CM

## 2023-09-14 DIAGNOSIS — K5900 Constipation, unspecified: Secondary | ICD-10-CM

## 2023-09-14 DIAGNOSIS — R1310 Dysphagia, unspecified: Secondary | ICD-10-CM | POA: Diagnosis not present

## 2023-09-14 MED ORDER — PEG 3350-KCL-NA BICARB-NACL 420 G PO SOLR: 4000.0000 mL | Freq: Once | ORAL | 0 refills | Status: AC

## 2023-09-14 NOTE — Patient Instructions (Addendum)
 Complete Trilyte  bowel prep.  Drink one 8 oz glass every 15-20 minutes until 1/2 the prep is complete.  Wait 30 minutes, then complete the rest of the prep as before. Follow a clear liquid diet while complete the bowel prep.   The day after you complete your bowel prep, if you are still having a lot of diarrhea, you can hold Linzess , but I want you to resume Linzess  the next day to ensure your bowels continue to move well.   If you notice you are not have productive bowel movements every day while taking Linzess  145 mcg daily, please let me know and I will increase the dose to 290 mcg daily.   We will arrange for you to have a swallow study at any point hospital to further evaluate the sensation of pills and foods getting hung in your esophagus/throat.  I will see back in 4 weeks or sooner if needed.  Josette Centers, PA-C [facility]

## 2023-09-21 ENCOUNTER — Ambulatory Visit (HOSPITAL_COMMUNITY)
Admission: RE | Admit: 2023-09-21 | Discharge: 2023-09-21 | Disposition: A | Discharge status: Home / Self Care | Source: Ambulatory Visit | Attending: Gastroenterology | Admitting: Gastroenterology

## 2023-09-21 DIAGNOSIS — R1310 Dysphagia, unspecified: Secondary | ICD-10-CM | POA: Insufficient documentation

## 2023-09-21 DIAGNOSIS — K224 Dyskinesia of esophagus: Secondary | ICD-10-CM | POA: Diagnosis not present

## 2023-09-22 DIAGNOSIS — R7303 Prediabetes: Secondary | ICD-10-CM | POA: Diagnosis not present

## 2023-09-22 DIAGNOSIS — M810 Age-related osteoporosis without current pathological fracture: Secondary | ICD-10-CM | POA: Diagnosis not present

## 2023-09-22 DIAGNOSIS — I25729 Atherosclerosis of autologous artery coronary artery bypass graft(s) with unspecified angina pectoris: Secondary | ICD-10-CM | POA: Diagnosis not present

## 2023-09-22 DIAGNOSIS — E039 Hypothyroidism, unspecified: Secondary | ICD-10-CM | POA: Diagnosis not present

## 2023-09-23 ENCOUNTER — Ambulatory Visit: Payer: Self-pay | Admitting: Gastroenterology

## 2023-09-26 LAB — LAB REPORT - SCANNED
A1c: 5.7
EGFR: 51
Free T4: 1.55 ng/dL
TSH: 6.52 — AB (ref 0.41–5.90)

## 2023-09-28 DIAGNOSIS — N182 Chronic kidney disease, stage 2 (mild): Secondary | ICD-10-CM | POA: Diagnosis not present

## 2023-09-28 DIAGNOSIS — I4891 Unspecified atrial fibrillation: Secondary | ICD-10-CM | POA: Diagnosis not present

## 2023-09-28 DIAGNOSIS — S41112A Laceration without foreign body of left upper arm, initial encounter: Secondary | ICD-10-CM | POA: Diagnosis not present

## 2023-09-28 DIAGNOSIS — E7800 Pure hypercholesterolemia, unspecified: Secondary | ICD-10-CM | POA: Diagnosis not present

## 2023-09-28 DIAGNOSIS — E039 Hypothyroidism, unspecified: Secondary | ICD-10-CM | POA: Diagnosis not present

## 2023-09-28 DIAGNOSIS — Z8781 Personal history of (healed) traumatic fracture: Secondary | ICD-10-CM | POA: Diagnosis not present

## 2023-09-28 DIAGNOSIS — I25729 Atherosclerosis of autologous artery coronary artery bypass graft(s) with unspecified angina pectoris: Secondary | ICD-10-CM | POA: Diagnosis not present

## 2023-09-28 DIAGNOSIS — R1319 Other dysphagia: Secondary | ICD-10-CM | POA: Diagnosis not present

## 2023-09-28 DIAGNOSIS — Z8709 Personal history of other diseases of the respiratory system: Secondary | ICD-10-CM | POA: Diagnosis not present

## 2023-09-28 DIAGNOSIS — R7303 Prediabetes: Secondary | ICD-10-CM | POA: Diagnosis not present

## 2023-09-28 DIAGNOSIS — Z86711 Personal history of pulmonary embolism: Secondary | ICD-10-CM | POA: Diagnosis not present

## 2023-09-28 DIAGNOSIS — M810 Age-related osteoporosis without current pathological fracture: Secondary | ICD-10-CM | POA: Diagnosis not present

## 2023-09-29 ENCOUNTER — Other Ambulatory Visit (HOSPITAL_COMMUNITY): Payer: Self-pay | Admitting: Internal Medicine

## 2023-09-29 DIAGNOSIS — M810 Age-related osteoporosis without current pathological fracture: Secondary | ICD-10-CM

## 2023-09-30 DIAGNOSIS — I4891 Unspecified atrial fibrillation: Secondary | ICD-10-CM | POA: Diagnosis not present

## 2023-09-30 DIAGNOSIS — E7800 Pure hypercholesterolemia, unspecified: Secondary | ICD-10-CM | POA: Diagnosis not present

## 2023-09-30 DIAGNOSIS — K219 Gastro-esophageal reflux disease without esophagitis: Secondary | ICD-10-CM | POA: Diagnosis not present

## 2023-09-30 DIAGNOSIS — E0590 Thyrotoxicosis, unspecified without thyrotoxic crisis or storm: Secondary | ICD-10-CM | POA: Diagnosis not present

## 2023-10-02 ENCOUNTER — Ambulatory Visit: Payer: Self-pay | Admitting: Gastroenterology

## 2023-10-06 ENCOUNTER — Ambulatory Visit (HOSPITAL_COMMUNITY)
Admission: RE | Admit: 2023-10-06 | Discharge: 2023-10-06 | Disposition: A | Discharge status: Home / Self Care | Source: Ambulatory Visit | Attending: Internal Medicine | Admitting: Internal Medicine

## 2023-10-06 DIAGNOSIS — M810 Age-related osteoporosis without current pathological fracture: Secondary | ICD-10-CM | POA: Insufficient documentation

## 2023-10-06 DIAGNOSIS — Z780 Asymptomatic menopausal state: Secondary | ICD-10-CM | POA: Diagnosis not present

## 2023-10-14 ENCOUNTER — Other Ambulatory Visit: Payer: Self-pay | Admitting: Gastroenterology

## 2023-10-14 ENCOUNTER — Telehealth: Payer: Self-pay

## 2023-10-14 DIAGNOSIS — K5904 Chronic idiopathic constipation: Secondary | ICD-10-CM

## 2023-10-14 MED ORDER — LINACLOTIDE 290 MCG PO CAPS: 290.0000 ug | ORAL_CAPSULE | Freq: Every day | ORAL | 5 refills | Status: AC

## 2023-10-14 NOTE — Telephone Encounter (Signed)
 Rx sent.

## 2023-10-14 NOTE — Telephone Encounter (Signed)
 Pt called wanting to increase her Linzess  to 290 and would like an Rx for it to be sent in to her pharmacy.

## 2023-10-21 DIAGNOSIS — R7989 Other specified abnormal findings of blood chemistry: Secondary | ICD-10-CM | POA: Diagnosis not present

## 2023-10-21 DIAGNOSIS — M810 Age-related osteoporosis without current pathological fracture: Secondary | ICD-10-CM | POA: Diagnosis not present

## 2023-10-21 DIAGNOSIS — I2699 Other pulmonary embolism without acute cor pulmonale: Secondary | ICD-10-CM | POA: Diagnosis not present

## 2023-10-21 DIAGNOSIS — E039 Hypothyroidism, unspecified: Secondary | ICD-10-CM | POA: Diagnosis not present

## 2023-10-21 DIAGNOSIS — I700 Atherosclerosis of aorta: Secondary | ICD-10-CM | POA: Diagnosis not present

## 2023-10-21 DIAGNOSIS — K219 Gastro-esophageal reflux disease without esophagitis: Secondary | ICD-10-CM | POA: Diagnosis not present

## 2023-10-21 DIAGNOSIS — K5909 Other constipation: Secondary | ICD-10-CM | POA: Diagnosis not present

## 2023-10-21 DIAGNOSIS — M545 Low back pain, unspecified: Secondary | ICD-10-CM | POA: Diagnosis not present

## 2023-10-21 DIAGNOSIS — E7800 Pure hypercholesterolemia, unspecified: Secondary | ICD-10-CM | POA: Diagnosis not present

## 2023-10-21 DIAGNOSIS — I4891 Unspecified atrial fibrillation: Secondary | ICD-10-CM | POA: Diagnosis not present

## 2023-10-21 DIAGNOSIS — G8929 Other chronic pain: Secondary | ICD-10-CM | POA: Diagnosis not present

## 2023-10-26 ENCOUNTER — Other Ambulatory Visit: Payer: Self-pay

## 2023-10-26 DIAGNOSIS — M810 Age-related osteoporosis without current pathological fracture: Secondary | ICD-10-CM | POA: Insufficient documentation

## 2023-10-27 ENCOUNTER — Other Ambulatory Visit: Payer: Self-pay

## 2023-10-27 ENCOUNTER — Other Ambulatory Visit (HOSPITAL_COMMUNITY): Payer: Self-pay

## 2023-10-31 DIAGNOSIS — K219 Gastro-esophageal reflux disease without esophagitis: Secondary | ICD-10-CM | POA: Diagnosis not present

## 2023-10-31 DIAGNOSIS — E7800 Pure hypercholesterolemia, unspecified: Secondary | ICD-10-CM | POA: Diagnosis not present

## 2023-10-31 DIAGNOSIS — I4891 Unspecified atrial fibrillation: Secondary | ICD-10-CM | POA: Diagnosis not present

## 2023-10-31 DIAGNOSIS — M810 Age-related osteoporosis without current pathological fracture: Secondary | ICD-10-CM | POA: Diagnosis not present

## 2023-11-01 ENCOUNTER — Telehealth: Payer: Self-pay

## 2023-11-01 NOTE — Telephone Encounter (Signed)
 Auth Submission: APPROVED Site of care: Site of care: [facility] Payer: [facility] medicare Medication & CPT/J Code(s) submitted: Prolia  (Denosumab ) [id number] Diagnosis Code:  Route of submission (phone, fax, portal): portal Phone # Fax # Auth type: Buy/Bill PB Units/visits requested: 60mg  q 6months x 2 doses Reference number: [id number] Approval from: 10/27/23 to 10/26/24

## 2023-11-04 ENCOUNTER — Encounter: Attending: Internal Medicine | Admitting: *Deleted

## 2023-11-04 VITALS — BP 155/78 | HR 69 | Temp 97.7°F | Resp 16

## 2023-11-04 DIAGNOSIS — M810 Age-related osteoporosis without current pathological fracture: Secondary | ICD-10-CM | POA: Diagnosis not present

## 2023-11-04 MED ORDER — DENOSUMAB 60 MG/ML ~~LOC~~ SOSY
60.0000 mg | PREFILLED_SYRINGE | Freq: Once | SUBCUTANEOUS | Status: AC
  Administered 2023-11-04: 60 mg via SUBCUTANEOUS

## 2023-11-04 NOTE — Progress Notes (Signed)
 Diagnosis: Osteoporosis  Provider:  Dwana Melena MD  Procedure: Injection  Prolia (Denosumab), Dose: 60 mg, Site: subcutaneous, Number of injections: 1  Injection Site(s): Left arm  Post Care: Observation period completed  Discharge: Condition: Good, Destination: Home . AVS Provided  Performed by:  Daleen Squibb, RN

## 2023-11-15 DIAGNOSIS — E039 Hypothyroidism, unspecified: Secondary | ICD-10-CM | POA: Diagnosis not present

## 2023-11-15 DIAGNOSIS — G8929 Other chronic pain: Secondary | ICD-10-CM | POA: Diagnosis not present

## 2023-11-15 DIAGNOSIS — M810 Age-related osteoporosis without current pathological fracture: Secondary | ICD-10-CM | POA: Diagnosis not present

## 2023-11-15 DIAGNOSIS — I4891 Unspecified atrial fibrillation: Secondary | ICD-10-CM | POA: Diagnosis not present

## 2023-11-15 DIAGNOSIS — E7800 Pure hypercholesterolemia, unspecified: Secondary | ICD-10-CM | POA: Diagnosis not present

## 2023-11-15 DIAGNOSIS — M545 Low back pain, unspecified: Secondary | ICD-10-CM | POA: Diagnosis not present

## 2023-11-15 DIAGNOSIS — K219 Gastro-esophageal reflux disease without esophagitis: Secondary | ICD-10-CM | POA: Diagnosis not present

## 2023-11-15 DIAGNOSIS — R7989 Other specified abnormal findings of blood chemistry: Secondary | ICD-10-CM | POA: Diagnosis not present

## 2023-11-15 DIAGNOSIS — K5909 Other constipation: Secondary | ICD-10-CM | POA: Diagnosis not present

## 2023-11-15 DIAGNOSIS — I700 Atherosclerosis of aorta: Secondary | ICD-10-CM | POA: Diagnosis not present

## 2023-11-15 DIAGNOSIS — I2699 Other pulmonary embolism without acute cor pulmonale: Secondary | ICD-10-CM | POA: Diagnosis not present

## 2023-11-29 DIAGNOSIS — H401122 Primary open-angle glaucoma, left eye, moderate stage: Secondary | ICD-10-CM | POA: Diagnosis not present

## 2023-11-29 DIAGNOSIS — Z961 Presence of intraocular lens: Secondary | ICD-10-CM | POA: Diagnosis not present

## 2023-11-29 DIAGNOSIS — H532 Diplopia: Secondary | ICD-10-CM | POA: Diagnosis not present

## 2023-11-29 DIAGNOSIS — H401113 Primary open-angle glaucoma, right eye, severe stage: Secondary | ICD-10-CM | POA: Diagnosis not present

## 2023-12-13 DIAGNOSIS — I700 Atherosclerosis of aorta: Secondary | ICD-10-CM | POA: Diagnosis not present

## 2023-12-13 DIAGNOSIS — M810 Age-related osteoporosis without current pathological fracture: Secondary | ICD-10-CM | POA: Diagnosis not present

## 2023-12-13 DIAGNOSIS — K219 Gastro-esophageal reflux disease without esophagitis: Secondary | ICD-10-CM | POA: Diagnosis not present

## 2023-12-13 DIAGNOSIS — R7989 Other specified abnormal findings of blood chemistry: Secondary | ICD-10-CM | POA: Diagnosis not present

## 2023-12-13 DIAGNOSIS — I4891 Unspecified atrial fibrillation: Secondary | ICD-10-CM | POA: Diagnosis not present

## 2023-12-13 DIAGNOSIS — M545 Low back pain, unspecified: Secondary | ICD-10-CM | POA: Diagnosis not present

## 2023-12-13 DIAGNOSIS — E7800 Pure hypercholesterolemia, unspecified: Secondary | ICD-10-CM | POA: Diagnosis not present

## 2023-12-13 DIAGNOSIS — K5909 Other constipation: Secondary | ICD-10-CM | POA: Diagnosis not present

## 2023-12-13 DIAGNOSIS — I2699 Other pulmonary embolism without acute cor pulmonale: Secondary | ICD-10-CM | POA: Diagnosis not present

## 2023-12-13 DIAGNOSIS — G8929 Other chronic pain: Secondary | ICD-10-CM | POA: Diagnosis not present

## 2023-12-13 DIAGNOSIS — E039 Hypothyroidism, unspecified: Secondary | ICD-10-CM | POA: Diagnosis not present

## 2024-01-03 ENCOUNTER — Telehealth: Payer: Self-pay | Admitting: *Deleted

## 2024-01-03 NOTE — Telephone Encounter (Signed)
 Nurse from [facility] called and wanted to know if pt could try Trulance, because Linzess  145 mcg is not strong enough and Linzess  290 mcg is too strong it causes diarrhea. Spoke to [employer], NP she said it would be fine. Informed the nurse at [facility] of recommendations. She voiced understanding.

## 2024-05-04 ENCOUNTER — Ambulatory Visit
